# Patient Record
Sex: Female | Born: 1955 | Race: White | Hispanic: No | Smoking: Former smoker
Health system: Southern US, Community
[De-identification: ages and names within clinical notes are randomized; demographics above are authoritative.]

## PROBLEM LIST (undated history)

## (undated) ENCOUNTER — Ambulatory Visit: Admission: EM | Payer: Medicare HMO

## (undated) DIAGNOSIS — G8929 Other chronic pain: Secondary | ICD-10-CM

## (undated) DIAGNOSIS — F32A Depression, unspecified: Secondary | ICD-10-CM

## (undated) DIAGNOSIS — E039 Hypothyroidism, unspecified: Secondary | ICD-10-CM

## (undated) DIAGNOSIS — E785 Hyperlipidemia, unspecified: Secondary | ICD-10-CM

## (undated) DIAGNOSIS — K219 Gastro-esophageal reflux disease without esophagitis: Secondary | ICD-10-CM

## (undated) DIAGNOSIS — R519 Headache, unspecified: Secondary | ICD-10-CM

## (undated) DIAGNOSIS — R112 Nausea with vomiting, unspecified: Secondary | ICD-10-CM

## (undated) DIAGNOSIS — M797 Fibromyalgia: Secondary | ICD-10-CM

## (undated) DIAGNOSIS — E119 Type 2 diabetes mellitus without complications: Secondary | ICD-10-CM

## (undated) DIAGNOSIS — M1990 Unspecified osteoarthritis, unspecified site: Secondary | ICD-10-CM

## (undated) DIAGNOSIS — I10 Essential (primary) hypertension: Secondary | ICD-10-CM

## (undated) DIAGNOSIS — Z9889 Other specified postprocedural states: Secondary | ICD-10-CM

## (undated) DIAGNOSIS — J45909 Unspecified asthma, uncomplicated: Secondary | ICD-10-CM

## (undated) DIAGNOSIS — F419 Anxiety disorder, unspecified: Secondary | ICD-10-CM

## (undated) HISTORY — PX: CHOLECYSTECTOMY: SHX55

## (undated) HISTORY — PX: HERNIA REPAIR: SHX51

## (undated) HISTORY — PX: APPENDECTOMY: SHX54

## (undated) HISTORY — PX: OTHER SURGICAL HISTORY: SHX169

## (undated) HISTORY — PX: KNEE DISLOCATION SURGERY: SHX689

## (undated) HISTORY — PX: CARPAL TUNNEL RELEASE: SHX101

## (undated) HISTORY — PX: ABDOMINAL HYSTERECTOMY: SHX81

## (undated) HISTORY — PX: ANKLE RECONSTRUCTION: SHX1151

## (undated) HISTORY — PX: KNEE ARTHROSCOPY W/ MENISCECTOMY: SHX1879

## (undated) HISTORY — PX: CATARACT EXTRACTION: SUR2

---

## 1999-05-23 ENCOUNTER — Inpatient Hospital Stay (HOSPITAL_COMMUNITY): Admission: EM | Admit: 1999-05-23 | Discharge: 1999-05-27 | Payer: Self-pay | Admitting: *Deleted

## 1999-05-25 ENCOUNTER — Encounter: Payer: Self-pay | Admitting: Cardiology

## 2000-10-17 ENCOUNTER — Other Ambulatory Visit: Admission: RE | Admit: 2000-10-17 | Discharge: 2000-10-17 | Payer: Self-pay | Admitting: Obstetrics and Gynecology

## 2001-05-03 ENCOUNTER — Encounter (HOSPITAL_COMMUNITY): Admission: RE | Admit: 2001-05-03 | Discharge: 2001-06-02 | Payer: Self-pay | Admitting: Neurological Surgery

## 2002-12-17 ENCOUNTER — Encounter: Admission: RE | Admit: 2002-12-17 | Discharge: 2002-12-17 | Payer: Self-pay | Admitting: Internal Medicine

## 2003-06-18 ENCOUNTER — Observation Stay (HOSPITAL_COMMUNITY): Admission: AD | Admit: 2003-06-18 | Discharge: 2003-06-19 | Payer: Self-pay | Admitting: *Deleted

## 2004-05-12 ENCOUNTER — Ambulatory Visit: Payer: Self-pay | Admitting: Cardiology

## 2004-07-15 ENCOUNTER — Encounter (HOSPITAL_COMMUNITY): Admission: RE | Admit: 2004-07-15 | Discharge: 2004-08-14 | Payer: Self-pay | Admitting: Pulmonary Disease

## 2004-07-22 ENCOUNTER — Observation Stay (HOSPITAL_COMMUNITY): Admission: EM | Admit: 2004-07-22 | Discharge: 2004-07-23 | Payer: Self-pay | Admitting: Cardiology

## 2004-07-22 ENCOUNTER — Ambulatory Visit: Payer: Self-pay | Admitting: Cardiology

## 2007-02-23 ENCOUNTER — Ambulatory Visit (HOSPITAL_COMMUNITY): Admission: RE | Admit: 2007-02-23 | Discharge: 2007-02-23 | Payer: Self-pay | Admitting: Obstetrics and Gynecology

## 2010-10-29 NOTE — Cardiovascular Report (Signed)
[place]  Patient:    [name]                        MRN: [id number] Proc. Date: 05/24/99 Adm. Date:  [id number] Attending:  Lewayne Bunting CC:         Oleh Genin, M.D., [facility], [place]                        Cardiac Catheterization  PROCEDURES PERFORMED: 1. Left heart catheterization. 2. Left ventriculogram. 3. Right heart catheterization.  DIAGNOSES: 1. Trivial coronary artery disease by angiogram. 2. Normal left ventricular systolic function. 3. Mild pulmonary hypertension.  HISTORY:  Ms. [name] is a 55 year old white female with a history of hypothyroidism, diabetes mellitus, asthmatic bronchitis, gastroesophageal reflux disease, who presents with increasing chest discomfort and shortness of breath.  The patient was admitted to the hospital and subsequently ruled out for acute myocardial infarction; however, she has continued to have chest discomfort. She presents now to the lab for further cardiac assessment.  TECHNIQUE:  After informed consent was obtained, the patient was brought to the  catheterization lab where both groins were sterilely prepped and draped. Lidocaine, 1%, was used to infiltrate the right groin, and a 6-French arterial nd 8-French venous sheath placed using the modified Seldinger technique.  A 7.5-French PA catheter was advanced via the right femoral vein to the pulmonary artery, and appropriate right-sided hemodynamics were obtained.  A 6-French pigtail catheter was advanced to the left ventricle, and appropriate left-sided hemodynamics were obtained.  A left ventriculogram was then performed using power injections of contrast.  The 6-French JL4 and JR4 catheters were then used to engage the left and right coronary arteries, and selective angiography was performed in various projections using manual injections of contrast.  At the termination of the case, the catheters and sheaths were removed, and manual  pressure applied until adequate hemostasis was achieved.  The patient tolerated the procedure well and was transferred to the floor in stable condition.  FINDINGS:  Findings are as follows: 1. Right heart catheterization    a. R equals 16/11; mean equals 11; RV equals 36/13; PA equals 33/17; pulmonary       capillary wedge pressure equals 16/12; P equals 16.    b. Mitral valve gradient is 4.5 mmHg.    c. Cardiac output equals 7.3 liters per minute with a cardiac index of 3.2       liters per minute/sq m by thermodilution.    d. LV pressure is 123/10.  Aortic was 123/82.  LVEDP equals 23. 2. Left heart catheterization    a. Left main trunk normal.    b. LAD:  Large caliber vessel that provides a first diagonal branch in the       proximal segment.  The LAD and diagonal branch are angiographically normal.    c. Left circumflex artery:  This consists of a large bifurcating first marginal       branch in the proximal segment.  The left circumflex artery has trivial       irregularities.    d. The right coronary artery is dominant.  This is a large caliber vessel that       provides a posterior descending artery as well as several posterior       ventricular branches in its terminal segment.  The right coronary artery as       trivial irregularities.  3. Left ventricle:  Normal end systolic and end diastolic dimensions.  Overall     left ventricular function is well preserved.  Ejection fraction is greater han     55%.  No mitral regurgitation.  ASSESSMENT AND PLAN:  Ms. [name] is a 55 year old female with shortness of breath and chest discomfort of unclear etiology.  At this point, continued medical management of the patients medical problems is warranted.  Attention may be directed toward treatment of diastolic dysfunction, and other causes of her pain will be investigated. DD:  05/24/99 TD:  05/24/99 Job: [id number]

## 2010-10-29 NOTE — Cardiovascular Report (Signed)
[name], SAULTER NO.:  [id number]   MEDICAL RECORD NO.:  [id number]          PATIENT TYPE:  INP   LOCATION:  2930                         FACILITY:  [facility]   PHYSICIAN:  Charlies Constable, M.D. [facility] DATE OF BIRTH:  07/09/1955   DATE OF PROCEDURE:  07/23/2004  DATE OF DISCHARGE:                              CARDIAC CATHETERIZATION   CLINICAL HISTORY:  Ms. Beaupre is 55 years old and was hospitalized at  [facility] recently, and while there she had respiratory arrest.  A  code was called,  but she was never intubated.  Dr. Lewayne Bunting saw her in  consultation and suspected a possible pulmonary embolus, but her VQ scan was  low probability and she subsequently had a CT scan done here which was  negative.  He scheduled her for evaluation angiography today.  She has had a  previous cath in January 2005, a year ago, which showed no to minimal  disease.   PROCEDURE:  The procedure was performed via the right femoral artery using  arterial sheath and 6 French preformed coronary catheters. A front wall  arterial puncture was performed and Omnipaque contrast was used.  We did a  right femoral angiogram to see if closure would e suitable, but stick was at  the bifurcation so we did not close the vessel.  The patient tolerated the  procedure well and left the laboratory in satisfactory condition.   RESULTS:  Aortic pressure was 129/95 with mean of 110.  Left ventricular  pressure 129/26.   Left main coronary artery:  The left main coronary was free of significant  disease.   Left anterior descending artery:  The left anterior descending artery gave  rise to three septal perforators and two diagonal branches.  These and the  LAD proper with free of significant disease.   Circumflex artery:  The circumflex artery gave rise to an atrial branch,  marginal branch, and a posterior lateral branch.  These vessels were free of  significant disease.   Right coronary artery:   The right coronary artery was a moderately large  vessel that gave rise to two right ventricular branches, posterior  descending branch, and three posterior lateral branches.  These vessels were  free of significant disease.   LEFT VENTRICULOGRAM:  The left ventriculogram performed in the RAO  projection showed good wall motion with no areas of hypokinesis.  The  estimated ejection fraction was 60%.   CONCLUSIONS:  Normal coronary angiography and normal left ventricular wall  motion.   RECOMMENDATIONS:  Reassurance.  I spoke with Dr. Andee Lineman and he felt the  patient's respiratory arrest was probably related to narcotics.  Will plan  to let the patient go home today and follow up with Dr. Andee Lineman in [place].      BB/MEDQ  D:  07/23/2004  T:  07/23/2004  Job:  [id number]   cc:   Learta Codding, M.D. [facility]

## 2010-10-29 NOTE — Discharge Summary (Signed)
[name], [name]                ACCOUNT NO.:  [id number]   MEDICAL RECORD NO.:  [id number]          PATIENT TYPE:  INP   LOCATION:  2930                         FACILITY:  [facility]   PHYSICIAN:  [name], M.D. LHCDATE OF BIRTH:  03-Jun-1956   DATE OF ADMISSION:  07/22/2004  DATE OF DISCHARGE:  07/23/2004                           DISCHARGE SUMMARY - REFERRING   SUMMARY OF HISTORY:  [name] is a 55 year old white female who initially  presented to [facility] on July 18, 2004, complaining of abdominal  discomfort that was compatible with diverticulitis.  She also complained of  reflux, nausea and vomiting.  She was receiving Dilaudid for her abdominal  discomfort at [facility], however, her family members stated that she  turned blue and stopped breathing when her oxygen saturation had dropped  down to 42%.  She was also noted to be hypotensive and resuscitated with  Narcan.  She did not need CPR nor intubation.  [facility] was asked  to evaluate because she had an abnormal EKG and some positive troponins were  initially felt to be possibly related to a pulmonary embolism.  However, a  VQ scan at [facility] showed low probability despite an elevated D-  dimer.  She was transferred to [facility] for cardiac  catheterization.   PAST MEDICAL HISTORY:  1.  Hyperlipidemia.  2.  Obesity.  3.  Adult onset diabetes.  4.  Depression.  5.  Asthmatic bronchitis.  6.  Chronic migraine headaches.  7.  DJD with nonobstructive coronary artery disease and catheterization in      2000.  8.  History of GERD.  9.  Cervical disc disease.  10. Hypothyroidism.  11. Wolff-Parkinson-White syndrome.  12. Status post ablation in 1993 at [facility].  13. Hyperlipidemia.  14. Depression.   LABORATORY DATA:  At [facility], the admission H&H was 14.4 and 43.9,  normal indices, platelets 199, wbc 10.0.  Subsequent  hematologies were  unremarkable.  PT 27.6.  D-dimer on July 22, 2004, was elevated at 1053.  Urinalysis on July 19, 2004, was unremarkable.  IgG on July 21, 2004,  was 615, which was low.  Relative indices at [facility] were  unremarkable.  Troponins, however, were slightly abnormal at 0.20, 011 and  0.05.  TSH was 5.42.  Sodium 136, potassium 3.8.  BUN 8, creatinine 0.6,  glucose 185.  Normal LFTs.   CT scan at [facility] did not show any evidence of  pulmonary embolus.  CT of the abdomen at [facility] on July 19, 2004, showed findings consistent with diverticulitis involving the sigmoid  colon, no evidence of abscess or incarceration.  Status post  cholecystectomy, diffuse fatty infiltration of the liver.  Chest x-ray  showed clear lungs on July 22, 2004.  VQ scan showed low probability for  pulmonary embolus.   EKGs at [facility] showed normal sinus rhythm, normal  axis, nonspecific STT wave changes.   HOSPITAL COURSE:  [name] was admitted to the coronary care unit  and  placed on IV heparin and nitroglycerin.  Overnight she stated that her  abdominal discomfort had significantly decreased since her admission,  however, she is still complaining of chest discomfort.  Repeat CK-MB at  [facility] was unremarkable and troponin I was 0.05.  Repeat hematology and chemistry was unremarkable.  She was arranged fro  cardiac catheterization.  Cardiac catheterization was performed on July 23, 2004, by Dr. Juanda Chance and showed an EF of 60% and no coronary artery  disease.  After reviewing with Dr. Andee Lineman, it was felt that the patient  could be discharged home with outpatient follow-up with her primary care  physician.   DISCHARGE DIAGNOSES:  1.  Diverticulitis.  2.  Gastroesophageal reflux disease.  3.  Respiratory arrest secondary to narcotics.  4.  Hyperglycemia with a history of diabetes and  elevated hemoglobin A1C per      the patient.  5.  Obesity.  6.  Normocytic anemia.  7.  History as previously described.   DISPOSITION:  She is asked to continue her home medications which include:  1.  Prevacid 30 mg b.i.d.  2.  Synthroid 0.125 mg daily.  3.  Resume her Glucovance half tablet daily on Sunday, July 25, 2004.  4.  Prozac 20 mg daily.  5.  Zocor 80 mg q.h.s.  6.  Ogen 1.25 mg daily.  7.  Reglan as previously.  8.  Xopenex four times a day as needed.  9.  Singulair 10 mg daily.  10. Xanax and Vicodin as previously.  11. Claritin 10 mg daily.  12. Topamax 200 mg daily.   She is advised to maintain a low salt, fat, cholesterol, ADA diet.  No  lifting, driving, sexual activity or heavy exertion for two days.  She was  asked to observe her catheterization site for bruising, draining, swelling  or discomfort and to call the [place], [place], office for problems.  She was asked to arrange a one to two week follow-up appointment with Dr.  Sherril Croon.      EW/MEDQ  D:  07/23/2004  T:  07/23/2004  Job:  [id number]   cc:   [name]  [address]  [place]  [place] [address]  Fax: [phone]   [name], M.D. [facility]

## 2010-10-29 NOTE — Discharge Summary (Signed)
[name], [name]                          ACCOUNT NO.:  [id number]   MEDICAL RECORD NO.:  [id number]                   PATIENT TYPE:  INP   LOCATION:  6529                                 FACILITY:   PHYSICIAN:  [name], M.D.                  DATE OF BIRTH:  1956/03/19   DATE OF ADMISSION:  06/18/2003  DATE OF DISCHARGE:  06/19/2003                           DISCHARGE SUMMARY - REFERRING   PROCEDURE:  1. Coronary angiogram, January 5.  2. Chest CT scan.   REASON FOR ADMISSION:  Please refer to Dr. Celedonio Tapia dictated  consultation note.   LABORATORY DATA:  D-dimer 0.49, WBC 5.9, HGB 11.9, HCT 36, platelet 172 at  discharge.  Sodium 139, potassium 3.7, glucose 154, BUN 13, creatinine 0.7  at discharge.  TSH 17.8 at discharge.   HOSPITAL COURSE:  Following initial presentation to [facility] with  complaint of chest pain, the patient was referred to [facility] for  consultation.  The patient has complex past medical history including  history of nonobstructive coronary artery disease by previous coronary  angiograms and Wolff-Parkinson-White syndrome status post RF ablation in  1993 [facility]).  Of note, the patient ruled out for myocardial infarction while  at [facility].  However, given her recurrent pain, recommendation was  to transfer for a definitive exclusion of significant coronary artery  disease by diagnostic coronary angiogram.   Following transfer, the patient underwent coronary angiogram by Dr.  ___________, (see report for full details), revealing normal coronary  arteries with the exception of a 20% proximal RCA lesion.  LV gram was  normal (EF 55-60%).   Following the procedure, the patient was referred for a chest CT scan.  This  revealed no evidence of pulmonary embolus and was also negative for aortic  dissection.   No further cardiac workup was recommended.   Of note, the patient does have elevated TSH, she stated that this  was  recently increased by her primary care physician to the current Synthroid  dose.  The patient also reports having a history of gastroesophageal reflux  disease and reporting relief with Prevacid.  She was thus placed back on  this at b.i.d. dosing.  She was otherwise to resume previous home  medications.   DISCHARGE MEDICATIONS:  1. Coated aspirin 81 mg daily.  2. Zocor 80 mg q.h.s.  3. Prevacid 30 mg b.i.d.  4. Synthroid 0.112 mg daily.  5. Ogen 1.25 mg daily.  6. Claritin 10 mg daily.  7. Zovirax 200 mg b.i.d.  8. Advair 250/50 mg daily.  9. Singulair 10 mg daily.  10.      Prozac 20 mg daily.  11.      Xanax p.r.n.  12.      Amaryl 1 mg daily.   INSTRUCTIONS:  No heavy lifting/driving x2 days.  Maintain low fat,  cholesterol diet.  Call the office if there  is any swelling/bleed in the  groin.   The patient is instructed to arrange followup with her primary care  physician, Dr. Loreen Tapia, in the following week.   DISCHARGE DIAGNOSES:  1. Noncardiac chest pain.     A. Negative serial cardiac markers.     B. Nonobstructive coronary artery disease/normal left ventricular        function coronary angiogram, January 5.  2. Wolff-Parkinson-White syndrome.  3. Status post RF ablation 1993 [facility]).  4. Treated hypothyroidism.     A. Status post recent medication up titration.  5. Type 2 diabetes mellitus.  6. Asthma.  7. Hypercholesterolemia.  8. Morbid obesity.  9. Gastroesophageal reflux disease.      [name], P.A. [facility]                      [name], M.D.    GS/MEDQ  D:  06/19/2003  T:  06/19/2003  Job:  [id number]   cc:   [name]  [address]  [place]  [place] [address]  Fax: [phone]

## 2010-10-29 NOTE — Cardiovascular Report (Signed)
NAME:  [name], [name]                          ACCOUNT NO.:  [id number]   MEDICAL RECORD NO.:  [id number]                   PATIENT TYPE:  INP   LOCATION:  NA                                   FACILITY:  [facility]   PHYSICIAN:  Jonelle Sidle, M.D. [facility]        DATE OF BIRTH:  12/24/1955   DATE OF PROCEDURE:  06/18/2003  DATE OF DISCHARGE:                              CARDIAC CATHETERIZATION   PRIMARY CARE PHYSICIAN:  __________   Corinda Gubler CARDIOLOGIST:  Dr. Myrtis Ser   INDICATIONS:  [name] is a 55 year old woman with obesity, type 2 diabetes  mellitus, gastroesophageal reflux disease, dyslipidemia, asthmatic  bronchitis, hypothyroidism, and a history of Wolff-Parkinson-White syndrome  status post ablation in 1993.  She presents with recurrent prolonged  episodes of chest pain and has ruled out for myocardial infarction.  This  study is requested to evaluate the coronary anatomy to assess for potential  progression.  Last cardiac catheterization in 2000 showed a trivial  atherosclerosis.   PROCEDURES PERFORMED:  1. Left heart catheterization.  2. Selective coronary angiography.  3. Left ventriculography.   ACCESS AND EQUIPMENT:  The area about the right femoral artery was  anesthetized with 1% lidocaine and a 6-French sheath was placed in the right  femoral artery via the modified Seldinger technique.  Standard preformed 6-  [place] and JR4 catheters were used for selective coronary angiography  and an angled pigtail catheter was used for left heart catheterization, left  ventriculography.  All exchanges were made over a wire and the patient  tolerated the procedure well without immediate complications.   HEMODYNAMICS:  1. Left ventricle 125/25 mmHg.  2. Aorta 124/79 mmHg.   ANGIOGRAPHIC FINDINGS:  1. The left main coronary artery is free of significant flow limiting     coronary atherosclerosis.  2. The left anterior descending is a medium to large caliber vessel without    significant flow limiting coronary atherosclerosis.  There is a diagonal     branch that is also without significant disease.  3. The circumflex coronary artery is a medium caliber vessel which largely     consists of a large bifurcating obtuse marginal.  There is no significant     flow limiting coronary atherosclerosis within this system.  4. The right coronary artery is a medium to large caliber vessel that is     dominant.  There is approximately 20% stenosis in the proximal segment,     although some of this could be catheter induced.  No flow limiting     stenoses are noted.   LEFT VENTRICULOGRAPHY:  Left ventricular ejection fraction is estimated at  55-60% based on left ventriculography performed in the RAO projection.  No  significant mitral regurgitation is noted.   DIAGNOSES:  1. Minor coronary atherosclerosis as outlined.  No significant flow limiting     stenoses are identified.  2. Left ventricular ejection fraction of 55-60%.  RECOMMENDATIONS:  I discussed the film with Dr. Myrtis Ser.  At this point from a  cardiac perspective would continue with risk factor modification and search  for other potential etiologies of chest pain.                                               Jonelle Sidle, M.D. [facility]    SGM/MEDQ  D:  06/18/2003  T:  06/18/2003  Job:  [id number]

## 2013-01-16 ENCOUNTER — Ambulatory Visit (HOSPITAL_COMMUNITY): Payer: Self-pay | Admitting: Psychology

## 2020-09-24 ENCOUNTER — Other Ambulatory Visit: Payer: Self-pay | Admitting: "Endocrinology

## 2020-09-24 ENCOUNTER — Other Ambulatory Visit: Payer: Self-pay | Admitting: Nurse Practitioner

## 2020-09-24 DIAGNOSIS — W19XXXA Unspecified fall, initial encounter: Secondary | ICD-10-CM

## 2020-10-12 ENCOUNTER — Ambulatory Visit
Admission: RE | Admit: 2020-10-12 | Discharge: 2020-10-12 | Disposition: A | Discharge status: Home / Self Care | Payer: Medicare HMO | Source: Ambulatory Visit | Attending: Nurse Practitioner | Admitting: Nurse Practitioner

## 2020-10-12 ENCOUNTER — Other Ambulatory Visit: Payer: Self-pay

## 2020-10-12 DIAGNOSIS — W19XXXA Unspecified fall, initial encounter: Secondary | ICD-10-CM

## 2020-10-12 MED ORDER — GADOBENATE DIMEGLUMINE 529 MG/ML IV SOLN
20.0000 mL | Freq: Once | INTRAVENOUS | Status: AC | PRN
  Administered 2020-10-12: 20 mL via INTRAVENOUS

## 2020-10-30 ENCOUNTER — Other Ambulatory Visit: Payer: Self-pay | Admitting: Physician Assistant

## 2020-10-30 ENCOUNTER — Other Ambulatory Visit: Payer: Self-pay

## 2020-10-30 ENCOUNTER — Ambulatory Visit
Admission: RE | Admit: 2020-10-30 | Discharge: 2020-10-30 | Disposition: A | Discharge status: Home / Self Care | Payer: Medicare HMO | Source: Ambulatory Visit | Attending: Physician Assistant | Admitting: Physician Assistant

## 2020-10-30 DIAGNOSIS — K5900 Constipation, unspecified: Secondary | ICD-10-CM

## 2020-10-30 DIAGNOSIS — R109 Unspecified abdominal pain: Secondary | ICD-10-CM

## 2020-11-23 ENCOUNTER — Other Ambulatory Visit: Payer: Self-pay | Admitting: Physician Assistant

## 2020-11-23 DIAGNOSIS — R1310 Dysphagia, unspecified: Secondary | ICD-10-CM

## 2020-11-23 DIAGNOSIS — K219 Gastro-esophageal reflux disease without esophagitis: Secondary | ICD-10-CM

## 2020-11-27 ENCOUNTER — Ambulatory Visit
Admission: RE | Admit: 2020-11-27 | Discharge: 2020-11-27 | Disposition: A | Discharge status: Home / Self Care | Payer: Medicare HMO | Source: Ambulatory Visit | Attending: Physician Assistant | Admitting: Physician Assistant

## 2020-11-27 DIAGNOSIS — R1310 Dysphagia, unspecified: Secondary | ICD-10-CM

## 2020-11-27 DIAGNOSIS — K219 Gastro-esophageal reflux disease without esophagitis: Secondary | ICD-10-CM

## 2021-02-03 ENCOUNTER — Other Ambulatory Visit: Payer: Self-pay

## 2021-02-03 ENCOUNTER — Ambulatory Visit: Payer: Medicare HMO | Admitting: Podiatry

## 2021-02-03 DIAGNOSIS — L03031 Cellulitis of right toe: Secondary | ICD-10-CM | POA: Diagnosis not present

## 2021-02-03 DIAGNOSIS — L603 Nail dystrophy: Secondary | ICD-10-CM

## 2021-02-03 MED ORDER — DOXYCYCLINE HYCLATE 100 MG PO TABS: 100.0000 mg | ORAL_TABLET | Freq: Two times a day (BID) | ORAL | 0 refills | Status: AC

## 2021-02-04 ENCOUNTER — Encounter: Payer: Self-pay | Admitting: Podiatry

## 2021-02-04 NOTE — Progress Notes (Signed)
Subjective:  Patient ID: TUWANA KAPAUN, female    DOB: January 01, 1956,  MRN: [id number]  Chief Complaint  Patient presents with   Ingrown Toenail    Right foot 2nd toe     65 y.o. female presents with the above complaint.  Patient presents with right second digit toenail paronychia.  Patient states that she may have stubbed it and there might be an ingrown.  There is some redness no drainage.  She has not seen and was prior to seeing me.  She would like to have the nail removed if needed.  She is a diabetic with last A1c of that is unknown.  She is on metformin.  She denies any other acute, she has not seen and was prior to seeing me.  Her pain scale is 5 out of 10 is aching nature.  She has not taken any antibiotics   Review of Systems: Negative except as noted in the HPI. Denies N/V/F/Ch.  No past medical history on file.  Current Outpatient Medications:    doxycycline (VIBRA-TABS) 100 MG tablet, Take 1 tablet (100 mg total) by mouth 2 (two) times daily for 10 days., Disp: 20 tablet, Rfl: 0   busPIRone (BUSPAR) 10 MG tablet, Take by mouth., Disp: , Rfl:    chlorthalidone (HYGROTON) 25 MG tablet, Take by mouth., Disp: , Rfl:    Cholecalciferol 25 MCG (1000 UT) tablet, Take by mouth., Disp: , Rfl:    dicyclomine (BENTYL) 10 MG capsule, Take by mouth., Disp: , Rfl:    fexofenadine (ALLEGRA) 180 MG tablet, Take by mouth., Disp: , Rfl:    FLUoxetine (PROZAC) 40 MG capsule, Take by mouth., Disp: , Rfl:    gabapentin (NEURONTIN) 600 MG tablet, Take by mouth., Disp: , Rfl:    HYDROcodone-acetaminophen (NORCO/VICODIN) 5-325 MG tablet, Take 1 tablet by mouth every 6 (six) hours as needed., Disp: , Rfl:    levothyroxine (SYNTHROID) 200 MCG tablet, Take by mouth., Disp: , Rfl:    metFORMIN (GLUCOPHAGE) 500 MG tablet, Take by mouth., Disp: , Rfl:    montelukast (SINGULAIR) 10 MG tablet, Take by mouth., Disp: , Rfl:    ondansetron (ZOFRAN) 4 MG tablet, Take by mouth., Disp: , Rfl:    pantoprazole  (PROTONIX) 40 MG tablet, Take by mouth., Disp: , Rfl:   Social History   Tobacco Use  Smoking Status Not on file  Smokeless Tobacco Not on file    Allergies  Allergen Reactions   Hydromorphone Shortness Of Breath    Respiratory arrest per patient   Penicillins Anaphylaxis   Erythromycin Base Nausea And Vomiting   Meperidine Hives   Objective:  There were no vitals filed for this visit. There is no height or weight on file to calculate BMI. Constitutional Well developed. Well nourished.  Vascular Dorsalis pedis pulses palpable bilaterally. Posterior tibial pulses palpable bilaterally. Capillary refill normal to all digits.  No cyanosis or clubbing noted. Pedal hair growth normal.  Neurologic Normal speech. Oriented to person, place, and time. Epicritic sensation to light touch grossly present bilaterally.  Dermatologic Pain on palpation of the entire/total nail on 2nd digit of the right No other open wounds. No skin lesions.  Orthopedic: Normal joint ROM without pain or crepitus bilaterally. No visible deformities. No bony tenderness.   Radiographs: None Assessment:   1. Paronychia of second toe, right   2. Nail dystrophy    Plan:  Patient was evaluated and treated and all questions answered.  Nail contusion/dystrophy second, right with  underlying paronychia -Patient elects to proceed with minor surgery to remove entire toenail today. Consent reviewed and signed by patient. -Entire/total nail excised. See procedure note. -Educated on post-procedure care including soaking. Written instructions provided and reviewed. -Patient to follow up in 2 weeks for nail check. -Doxycycline was dispensed for skin and soft tissue prophylaxis.  I did discuss with her given that she is diabetic she is at high risk of losing the toe given the nature of the infection.  I discussed with her that if the infection gets worse to go to the emergency room right away.  She states  understanding.  Procedure: Excision of entire/total nail  Location: Right 2nd toe digit Anesthesia: Lidocaine 1% plain; 1.5 mL and Marcaine 0.5% plain; 1.5 mL, digital block. Skin Prep: Betadine. Dressing: Silvadene; telfa; dry, sterile, compression dressing. Technique: Following skin prep, the toe was exsanguinated and a tourniquet was secured at the base of the toe. The affected nail border was freed and excised. The tourniquet was then removed and sterile dressing applied. Disposition: Patient tolerated procedure well. Patient to return in 2 weeks for follow-up.   No follow-ups on file.

## 2021-02-17 NOTE — Progress Notes (Signed)
Sent message, via epic in basket, requesting orders in epic from surgeon.  

## 2021-02-25 NOTE — Patient Instructions (Addendum)
DUE TO COVID-19 ONLY ONE VISITOR IS ALLOWED TO COME WITH YOU AND STAY IN THE WAITING ROOM ONLY DURING PRE OP AND PROCEDURE.   **NO VISITORS ARE ALLOWED IN THE SHORT STAY AREA OR RECOVERY ROOM!!**  IF YOU WILL BE ADMITTED INTO THE HOSPITAL YOU ARE ALLOWED ONLY TWO SUPPORT PEOPLE DURING VISITATION HOURS ONLY (10AM -8PM)   The support person(s) may change daily. The support person(s) must pass our screening, gel in and out, and wear a mask at all times, including in the patient's room. Patients must also wear a mask when staff or their support person are in the room.  No visitors under the age of 56. Any visitor under the age of 41 must be accompanied by an adult.    COVID SWAB TESTING MUST BE COMPLETED ON:  03/08/21 **MUST PRESENT COMPLETED FORM AT TESTING SITE**    [address] [place] (backside of the building) Open 8am-3pm. No appointment needed You are not required to quarantine, however you are required to wear a well-fitted mask when you are out and around people not in your household.  Hand Hygiene often Do NOT share personal items Notify your provider if you are in close contact with someone who has COVID or you develop fever 100.4 or greater, new onset of sneezing, cough, sore throat, shortness of breath or body aches.       Your procedure is scheduled on: 03/10/21   Report to [facility] Main  Entrance   Report to Short Stay at 5:15 AM   [facility])   Call this number if you have problems the morning of surgery [phone]   Do not eat food :After Midnight.   May have liquids until 4:30 AM day of surgery  CLEAR LIQUID DIET  Foods Allowed                                                                     Foods Excluded  Water, Black Coffee and tea (no milk or creamer)           liquids that you cannot  Plain Jell-O in any flavor  (No red)                                    see through such as: Fruit ices (not with fruit pulp)                                             milk, soups, orange juice              Iced Popsicles (No red)                                                All solid food  Apple juices Sports drinks like Gatorade (No red) Lightly seasoned clear broth or consume(fat free) Sugar      The day of surgery:  Drink ONE (1) Pre-Surgery G2 by 4:30 am the morning of surgery. Drink in one sitting. Do not sip.  This drink was given to you during your hospital  pre-op appointment visit. Nothing else to drink after completing the  Pre-Surgery G2.          If you have questions, please contact your surgeon's office.     Oral Hygiene is also important to reduce your risk of infection.                                    Remember - BRUSH YOUR TEETH THE MORNING OF SURGERY WITH YOUR REGULAR TOOTHPASTE   Take these medicines the morning of surgery with A SIP OF WATER: Buspar, Prozac, Gabapentin, Levothyroxine, Singulair, Protonix  DO NOT TAKE ANY ORAL DIABETIC MEDICATIONS DAY OF YOUR SURGERY  How to Manage Your Diabetes Before and After Surgery  Why is it important to control my blood sugar before and after surgery? Improving blood sugar levels before and after surgery helps healing and can limit problems. A way of improving blood sugar control is eating a healthy diet by:  Eating less sugar and carbohydrates  Increasing activity/exercise  Talking with your doctor about reaching your blood sugar goals High blood sugars (greater than 180 mg/dL) can raise your risk of infections and slow your recovery, so you will need to focus on controlling your diabetes during the weeks before surgery. Make sure that the doctor who takes care of your diabetes knows about your planned surgery including the date and location.  How do I manage my blood sugar before surgery? Check your blood sugar at least 4 times a day, starting 2 days before surgery, to make sure that the level is not too high or  low. Check your blood sugar the morning of your surgery when you wake up and every 2 hours until you get to the Short Stay unit. If your blood sugar is less than 70 mg/dL, you will need to treat for low blood sugar: Do not take insulin. Treat a low blood sugar (less than 70 mg/dL) with  cup of clear juice (cranberry or apple), 4 glucose tablets, OR glucose gel. Recheck blood sugar in 15 minutes after treatment (to make sure it is greater than 70 mg/dL). If your blood sugar is not greater than 70 mg/dL on recheck, call [phone] for further instructions. Report your blood sugar to the short stay nurse when you get to Short Stay.  If you are admitted to the hospital after surgery: Your blood sugar will be checked by the staff and you will probably be given insulin after surgery (instead of oral diabetes medicines) to make sure you have good blood sugar levels. The goal for blood sugar control after surgery is 80-180 mg/dL.   WHAT DO I DO ABOUT MY DIABETES MEDICATION?  Do not take oral diabetes medicines (pills) the morning of surgery.  THE DAY BEFORE SURGERY, take Metformin as prescribed. Take 13 units of Lantus. Take Novolog before meals as usual, no bedtime dose.      THE MORNING OF SURGERY, do not take Metformin. Take 50% of sliding scale dose if blood sugar is greater than 220.   Reviewed and Endorsed by [facility] Patient Education Committee, August  2015                               You may not have any metal on your body including hair pins, jewelry, and body piercing             Do not wear make-up, lotions, powders, perfumes, or deodorant  Do not wear nail polish including gel and S&S, artificial/acrylic nails, or any other type of covering on natural nails including finger and toenails. If you have artificial nails, gel coating, etc. that needs to be removed by a nail salon please have this removed prior to surgery or surgery may need to be canceled/ delayed if the surgeon/  anesthesia feels like they are unable to be safely monitored.   Do not shave  48 hours prior to surgery.    Do not bring valuables to the hospital. [place] IS NOT             RESPONSIBLE   FOR VALUABLES.   Contacts, dentures or bridgework may not be worn into surgery.   Bring small overnight bag day of surgery.   Special Instructions: Bring a copy of your healthcare power of attorney and living will documents         the day of surgery if you haven't scanned them in before.   Please read over the following fact sheets you were given: IF YOU HAVE QUESTIONS ABOUT YOUR PRE OP INSTRUCTIONS PLEASE CALL [phone]- [facility] - Preparing for Surgery Before surgery, you can play an important role.  Because skin is not sterile, your skin needs to be as free of germs as possible.  You can reduce the number of germs on your skin by washing with CHG (chlorahexidine gluconate) soap before surgery.  CHG is an antiseptic cleaner which kills germs and bonds with the skin to continue killing germs even after washing. Please DO NOT use if you have an allergy to CHG or antibacterial soaps.  If your skin becomes reddened/irritated stop using the CHG and inform your nurse when you arrive at Short Stay. Do not shave (including legs and underarms) for at least 48 hours prior to the first CHG shower.  You may shave your face/neck.  Please follow these instructions carefully:  1.  Shower with CHG Soap the night before surgery and the  morning of surgery.  2.  If you choose to wash your hair, wash your hair first as usual with your normal  shampoo.  3.  After you shampoo, rinse your hair and body thoroughly to remove the shampoo.                             4.  Use CHG as you would any other liquid soap.  You can apply chg directly to the skin and wash.  Gently with a scrungie or clean washcloth.  5.  Apply the CHG Soap to your body ONLY FROM THE NECK DOWN.   Do   not use on face/ open                            Wound or open sores. Avoid contact with eyes, ears mouth and   genitals (private parts).  Wash face,  Genitals (private parts) with your normal soap.             6.  Wash thoroughly, paying special attention to the area where your    surgery  will be performed.  7.  Thoroughly rinse your body with warm water from the neck down.  8.  DO NOT shower/wash with your normal soap after using and rinsing off the CHG Soap.                9.  Pat yourself dry with a clean towel.            10.  Wear clean pajamas.            11.  Place clean sheets on your bed the night of your first shower and do not  sleep with pets. Day of Surgery : Do not apply any lotions/deodorants the morning of surgery.  Please wear clean clothes to the hospital/surgery center.  FAILURE TO FOLLOW THESE INSTRUCTIONS MAY RESULT IN THE CANCELLATION OF YOUR SURGERY  PATIENT SIGNATURE_________________________________  NURSE SIGNATURE__________________________________  ________________________________________________________________________   [name]  An incentive spirometer is a tool that can help keep your lungs clear and active. This tool measures how well you are filling your lungs with each breath. Taking long deep breaths may help reverse or decrease the chance of developing breathing (pulmonary) problems (especially infection) following: A long period of time when you are unable to move or be active. BEFORE THE PROCEDURE  If the spirometer includes an indicator to show your best effort, your nurse or respiratory therapist will set it to a desired goal. If possible, sit up straight or lean slightly forward. Try not to slouch. Hold the incentive spirometer in an upright position. INSTRUCTIONS FOR USE  Sit on the edge of your bed if possible, or sit up as far as you can in bed or on a chair. Hold the incentive spirometer in an upright position. Breathe out normally. Place the  mouthpiece in your mouth and seal your lips tightly around it. Breathe in slowly and as deeply as possible, raising the piston or the ball toward the top of the column. Hold your breath for 3-5 seconds or for as long as possible. Allow the piston or ball to fall to the bottom of the column. Remove the mouthpiece from your mouth and breathe out normally. Rest for a few seconds and repeat Steps 1 through 7 at least 10 times every 1-2 hours when you are awake. Take your time and take a few normal breaths between deep breaths. The spirometer may include an indicator to show your best effort. Use the indicator as a goal to work toward during each repetition. After each set of 10 deep breaths, practice coughing to be sure your lungs are clear. If you have an incision (the cut made at the time of surgery), support your incision when coughing by placing a pillow or rolled up towels firmly against it. Once you are able to get out of bed, walk around indoors and cough well. You may stop using the incentive spirometer when instructed by your caregiver.  RISKS AND COMPLICATIONS Take your time so you do not get dizzy or light-headed. If you are in pain, you may need to take or ask for pain medication before doing incentive spirometry. It is harder to take a deep breath if you are having pain. AFTER USE Rest and breathe slowly and easily. It can be helpful to  keep track of a log of your progress. Your caregiver can provide you with a simple table to help with this. If you are using the spirometer at home, follow these instructions: SEEK MEDICAL CARE IF:  You are having difficultly using the spirometer. You have trouble using the spirometer as often as instructed. Your pain medication is not giving enough relief while using the spirometer. You develop fever of 100.5 F (38.1 C) or higher. SEEK IMMEDIATE MEDICAL CARE IF:  You cough up bloody sputum that had not been present before. You develop fever of 102 F  (38.9 C) or greater. You develop worsening pain at or near the incision site. MAKE SURE YOU:  Understand these instructions. Will watch your condition. Will get help right away if you are not doing well or get worse. Document Released: 10/10/2006 Document Revised: 08/22/2011 Document Reviewed: 12/11/2006 [employer] Patient Information 2014 [employer], [place].   ________________________________________________________________________  WHAT IS A BLOOD TRANSFUSION? Blood Transfusion Information  A transfusion is the replacement of blood or some of its parts. Blood is made up of multiple cells which provide different functions. Red blood cells carry oxygen and are used for blood loss replacement. White blood cells fight against infection. Platelets control bleeding. Plasma helps clot blood. Other blood products are available for specialized needs, such as hemophilia or other clotting disorders. BEFORE THE TRANSFUSION  Who gives blood for transfusions?  Healthy volunteers who are fully evaluated to make sure their blood is safe. This is blood bank blood. Transfusion therapy is the safest it has ever been in the practice of medicine. Before blood is taken from a donor, a complete history is taken to make sure that person has no history of diseases nor engages in risky social behavior (examples are intravenous drug use or sexual activity with multiple partners). The donor's travel history is screened to minimize risk of transmitting infections, such as malaria. The donated blood is tested for signs of infectious diseases, such as HIV and hepatitis. The blood is then tested to be sure it is compatible with you in order to minimize the chance of a transfusion reaction. If you or a relative donates blood, this is often done in anticipation of surgery and is not appropriate for emergency situations. It takes many days to process the donated blood. RISKS AND COMPLICATIONS Although transfusion therapy is very  safe and saves many lives, the main dangers of transfusion include:  Getting an infectious disease. Developing a transfusion reaction. This is an allergic reaction to something in the blood you were given. Every precaution is taken to prevent this. The decision to have a blood transfusion has been considered carefully by your caregiver before blood is given. Blood is not given unless the benefits outweigh the risks. AFTER THE TRANSFUSION Right after receiving a blood transfusion, you will usually feel much better and more energetic. This is especially true if your red blood cells have gotten low (anemic). The transfusion raises the level of the red blood cells which carry oxygen, and this usually causes an energy increase. The nurse administering the transfusion will monitor you carefully for complications. HOME CARE INSTRUCTIONS  No special instructions are needed after a transfusion. You may find your energy is better. Speak with your caregiver about any limitations on activity for underlying diseases you may have. SEEK MEDICAL CARE IF:  Your condition is not improving after your transfusion. You develop redness or irritation at the intravenous (IV) site. SEEK IMMEDIATE MEDICAL CARE IF:  Any of the  following symptoms occur over the next 12 hours: Shaking chills. You have a temperature by mouth above 102 F (38.9 C), not controlled by medicine. Chest, back, or muscle pain. People around you feel you are not acting correctly or are confused. Shortness of breath or difficulty breathing. Dizziness and fainting. You get a rash or develop hives. You have a decrease in urine output. Your urine turns a dark color or changes to pink, red, or brown. Any of the following symptoms occur over the next 10 days: You have a temperature by mouth above 102 F (38.9 C), not controlled by medicine. Shortness of breath. Weakness after normal activity. The white part of the eye turns yellow (jaundice). You  have a decrease in the amount of urine or are urinating less often. Your urine turns a dark color or changes to pink, red, or brown. Document Released: 05/27/2000 Document Revised: 08/22/2011 Document Reviewed: 01/14/2008 [facility] Patient Information 2014 [place], [place].  _______________________________________________________________________

## 2021-02-25 NOTE — Progress Notes (Addendum)
COVID swab appointment: 03/08/21  COVID Vaccine Completed: No Date COVID Vaccine completed: Has received booster: COVID vaccine manufacturer: Pfizer    [employer]   Date of COVID positive in last 90 days: No  PCP - Wallace Cullens, FNP Cardiologist - N/a  Chest x-ray - N/a EKG - 02/26/21 Epic Stress Test - [facility], 5 years ago ECHO - [facility] Cardiac Cath -  [facility] Pacemaker/ICD device last checked: N/a Spinal Cord Stimulator: N/a  Sleep Study - yes years ago, positive CPAP - no, lost weight  Fasting Blood Sugar - 70-130 Checks Blood Sugar _3-5_ times a day  Blood Thinner Instructions: ASA 81 Aspirin Instructions: hold 7 days before surgery Last Dose:  Activity level: Can go up a flight of stairs and perform activities of daily living without stopping and without symptoms of chest pain or shortness of breath.    Anesthesia review: new RBBB on EKG  Patient denies shortness of breath, fever, cough and chest pain at PAT appointment   Patient verbalized understanding of instructions that were given to them at the PAT appointment. Patient was also instructed that they will need to review over the PAT instructions again at home before surgery.

## 2021-02-26 ENCOUNTER — Encounter (HOSPITAL_COMMUNITY): Payer: Self-pay

## 2021-02-26 ENCOUNTER — Encounter (HOSPITAL_COMMUNITY)
Admission: RE | Admit: 2021-02-26 | Discharge: 2021-02-26 | Disposition: A | Discharge status: Home / Self Care | Payer: Medicare HMO | Source: Ambulatory Visit | Attending: Orthopedic Surgery | Admitting: Orthopedic Surgery

## 2021-02-26 ENCOUNTER — Other Ambulatory Visit: Payer: Self-pay

## 2021-02-26 DIAGNOSIS — K219 Gastro-esophageal reflux disease without esophagitis: Secondary | ICD-10-CM | POA: Diagnosis not present

## 2021-02-26 DIAGNOSIS — Z7984 Long term (current) use of oral hypoglycemic drugs: Secondary | ICD-10-CM | POA: Diagnosis not present

## 2021-02-26 DIAGNOSIS — Z79899 Other long term (current) drug therapy: Secondary | ICD-10-CM | POA: Diagnosis not present

## 2021-02-26 DIAGNOSIS — I10 Essential (primary) hypertension: Secondary | ICD-10-CM | POA: Diagnosis not present

## 2021-02-26 DIAGNOSIS — Z7982 Long term (current) use of aspirin: Secondary | ICD-10-CM | POA: Insufficient documentation

## 2021-02-26 DIAGNOSIS — J45909 Unspecified asthma, uncomplicated: Secondary | ICD-10-CM | POA: Diagnosis not present

## 2021-02-26 DIAGNOSIS — M1611 Unilateral primary osteoarthritis, right hip: Secondary | ICD-10-CM | POA: Diagnosis not present

## 2021-02-26 DIAGNOSIS — Z01818 Encounter for other preprocedural examination: Secondary | ICD-10-CM | POA: Insufficient documentation

## 2021-02-26 DIAGNOSIS — M797 Fibromyalgia: Secondary | ICD-10-CM | POA: Insufficient documentation

## 2021-02-26 DIAGNOSIS — Z794 Long term (current) use of insulin: Secondary | ICD-10-CM | POA: Insufficient documentation

## 2021-02-26 DIAGNOSIS — E119 Type 2 diabetes mellitus without complications: Secondary | ICD-10-CM | POA: Diagnosis not present

## 2021-02-26 DIAGNOSIS — I4510 Unspecified right bundle-branch block: Secondary | ICD-10-CM | POA: Insufficient documentation

## 2021-02-26 HISTORY — DX: Headache, unspecified: R51.9

## 2021-02-26 HISTORY — DX: Nausea with vomiting, unspecified: R11.2

## 2021-02-26 HISTORY — DX: Unspecified asthma, uncomplicated: J45.909

## 2021-02-26 HISTORY — DX: Type 2 diabetes mellitus without complications: E11.9

## 2021-02-26 HISTORY — DX: Other specified postprocedural states: Z98.890

## 2021-02-26 HISTORY — DX: Gastro-esophageal reflux disease without esophagitis: K21.9

## 2021-02-26 HISTORY — DX: Anxiety disorder, unspecified: F41.9

## 2021-02-26 HISTORY — DX: Depression, unspecified: F32.A

## 2021-02-26 HISTORY — DX: Unspecified osteoarthritis, unspecified site: M19.90

## 2021-02-26 HISTORY — DX: Hypothyroidism, unspecified: E03.9

## 2021-02-26 HISTORY — DX: Essential (primary) hypertension: I10

## 2021-02-26 HISTORY — DX: Other chronic pain: G89.29

## 2021-02-26 HISTORY — DX: Hyperlipidemia, unspecified: E78.5

## 2021-02-26 LAB — COMPREHENSIVE METABOLIC PANEL
ALT: 17 U/L (ref 0–44)
AST: 18 U/L (ref 15–41)
Albumin: 4.4 g/dL (ref 3.5–5.0)
Alkaline Phosphatase: 54 U/L (ref 38–126)
Anion gap: 10 (ref 5–15)
BUN: 29 mg/dL — ABNORMAL HIGH (ref 8–23)
CO2: 32 mmol/L (ref 22–32)
Calcium: 10 mg/dL (ref 8.9–10.3)
Chloride: 98 mmol/L (ref 98–111)
Creatinine, Ser: 0.76 mg/dL (ref 0.44–1.00)
GFR, Estimated: >60 mL/min (ref >60)
Glucose, Bld: 71 mg/dL (ref 70–99)
Potassium: 3.6 mmol/L (ref 3.5–5.1)
Sodium: 140 mmol/L (ref 135–145)
Total Bilirubin: 0.8 mg/dL (ref 0.3–1.2)
Total Protein: 7.8 g/dL (ref 6.5–8.1)

## 2021-02-26 LAB — CBC
HCT: 48.4 % — ABNORMAL HIGH (ref 36.0–46.0)
Hemoglobin: 15.2 g/dL — ABNORMAL HIGH (ref 12.0–15.0)
MCH: 28.4 pg (ref 26.0–34.0)
MCHC: 31.4 g/dL (ref 30.0–36.0)
MCV: 90.5 fL (ref 80.0–100.0)
Platelets: 231 10*3/uL (ref 150–400)
RBC: 5.35 MIL/uL — ABNORMAL HIGH (ref 3.87–5.11)
RDW: 12.8 % (ref 11.5–15.5)
WBC: 10.1 10*3/uL (ref 4.0–10.5)
nRBC: 0 % (ref 0.0–0.2)

## 2021-02-26 LAB — PROTIME-INR
INR: 1 (ref 0.8–1.2)
Prothrombin Time: 12.7 seconds (ref 11.4–15.2)

## 2021-02-26 LAB — SURGICAL PCR SCREEN
MRSA, PCR: POSITIVE — AB
Staphylococcus aureus: POSITIVE — AB

## 2021-02-26 LAB — HEMOGLOBIN A1C
Hgb A1c MFr Bld: 7.1 % — ABNORMAL HIGH (ref 4.8–5.6)
Mean Plasma Glucose: 157.07 mg/dL

## 2021-02-26 LAB — TYPE AND SCREEN
ABO/RH(D): A NEG
Antibody Screen: NEGATIVE

## 2021-02-26 LAB — GLUCOSE, CAPILLARY: Glucose-Capillary: 100 mg/dL — ABNORMAL HIGH (ref 70–99)

## 2021-03-02 NOTE — Progress Notes (Signed)
Anesthesia Chart Review   Case: [id number] Date/Time: 03/10/21 0715   Procedure: TOTAL HIP ARTHROPLASTY ANTERIOR APPROACH (Right: Hip)   Anesthesia type: Choice   Pre-op diagnosis: right hip osteoarthritis   Location: [facility] ROOM 10 / [facility]   Surgeons: Ollen Gross, MD       DISCUSSION:65 y.o. 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 Ollen Gross.   Clearance received from PCP which states pt is moderate risk due to insulin dependent diabetes, HTN, and h/o cardiac ablation due to WPW.   Anticipate pt can proceed with planned procedure barring acute status change.   VS: BP 107/70   Pulse 75   Temp 36.6 C (Oral)   Resp 16   Ht 5\' 3"  (1.6 m)   SpO2 100%   PROVIDERS: , FNP is PCP    LABS: Labs reviewed: Acceptable for surgery. (all labs ordered are listed, but only abnormal results are displayed)  Labs Reviewed  SURGICAL PCR SCREEN - Abnormal; Notable for the following components:      Result Value   MRSA, PCR POSITIVE (*)    Staphylococcus aureus POSITIVE (*)    All other components within normal limits  CBC - Abnormal; Notable for the following components:   RBC 5.35 (*)    Hemoglobin 15.2 (*)    HCT 48.4 (*)    All other components within normal limits  COMPREHENSIVE METABOLIC PANEL - Abnormal; Notable for the following components:   BUN 29 (*)    All other components within normal limits  HEMOGLOBIN A1C - Abnormal; Notable for the following components:   Hgb A1c MFr Bld 7.1 (*)    All other components within normal limits  GLUCOSE, CAPILLARY - Abnormal; Notable for the following components:   Glucose-Capillary 100 (*)    All other components within normal limits  PROTIME-INR  TYPE AND SCREEN     IMAGES:   EKG: 02/26/2021 Rate 69 bpm  Normal sinus rhythm Right bundle branch block T wave  abnormality, consider inferior ischemia Abnormal ECG Right bundle branch block is new  CV: *TTE 08/25/17: 1. Left ventricle: The left ventricular cavity size is normal. Wall thickness is normal. Systolic function is normal. The ejection fraction is 60%. No segmental wall motion abnormalities. Inconclusive diastolic evaluation due to insufficient data. 2. Right atrium: The right atrium is mildly dilated. 3. Tricuspid valve: There is mild tricuspid regurgitation. 4. Pulmonary arteries: Pulmonary artery estimated peak systolic pressure: 43 mm Hg. This is consistent with mild to moderate pulmonary hypertension.  Lexiscan Cardiolite stress test performed at [facility] on 03/07/2017 showed normal perfusion with no defects to suggest ischemia or scar, EF 65%.  Cardiac Cath 04/09/2012 CONCLUSIONS:        CORONARY STATUS: Normal          LV FUNCTION:               Ejection Fraction:   55%               Wall Motion:         Normal            OTHER:  Widely patent coronaries.  Normal left main.  Normal left ventricular function.   Echo 04/08/2012 SUMMARY  Overall normal valvular appearance and function without significant stenosis  or regurgitation.  The left ventricular size is  normal.  There is normal left ventricular wall thickness.  Past Medical History:  Diagnosis Date   Anxiety    Asthma    Chronic pain    Depression    Diabetes mellitus without complication (HCC)    GERD (gastroesophageal reflux disease)    Headache    migraines   Hyperlipidemia    Hypertension    Hypothyroidism    Osteoarthritis    PONV (postoperative nausea and vomiting)     Past Surgical History:  Procedure Laterality Date   ABDOMINAL HYSTERECTOMY     ANKLE RECONSTRUCTION Left    1977   APPENDECTOMY     CARPAL TUNNEL RELEASE Right    2010   catheter ablation     1993   CESAREAN SECTION     x2   CHOLECYSTECTOMY     HERNIA REPAIR     KNEE ARTHROSCOPY W/ MENISCECTOMY Right    2008    KNEE DISLOCATION SURGERY Right    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     2019   vaginal fistula of sigmoid colon      MEDICATIONS:  aspirin EC 81 MG tablet   Biotin 10626 MCG TABS   buPROPion (WELLBUTRIN SR) 150 MG 12 hr tablet   busPIRone (BUSPAR) 10 MG tablet   chlorthalidone (HYGROTON) 25 MG tablet   Cholecalciferol 25 MCG (1000 UT) tablet   COLLAGEN PO   cyanocobalamin 2000 MCG tablet   estradiol (ESTRACE) 0.5 MG tablet   ferrous gluconate (FERGON) 324 MG tablet   furosemide (LASIX) 20 MG tablet   gabapentin (NEURONTIN) 600 MG tablet   insulin aspart (NOVOLOG) 100 UNIT/ML injection   insulin glargine (LANTUS) 100 UNIT/ML injection   levocetirizine (XYZAL) 5 MG tablet   levothyroxine (SYNTHROID) 175 MCG tablet   lovastatin (MEVACOR) 40 MG tablet   meclizine (ANTIVERT) 25 MG tablet   metFORMIN (GLUCOPHAGE) 500 MG tablet   montelukast (SINGULAIR) 10 MG tablet   ondansetron (ZOFRAN) 4 MG tablet   oxycodone-acetaminophen (LYNOX) 10-300 MG tablet   rizatriptan (MAXALT) 10 MG tablet   tiZANidine (ZANAFLEX) 4 MG capsule   valACYclovir (VALTREX) 500 MG tablet   vitamin B-12 (CYANOCOBALAMIN) 500 MCG tablet   zinc gluconate 50 MG tablet   No current facility-administered medications for this encounter.    Jodell Cipro Ward, PA-C [facility] Pre-Surgical Testing [phone]

## 2021-03-02 NOTE — Anesthesia Preprocedure Evaluation (Addendum)
Anesthesia Evaluation  Patient identified by MRN, date of birth, ID band Patient awake    Reviewed: Allergy & Precautions, NPO status , Patient's Chart, lab work & pertinent test results  History of Anesthesia Complications (+) PONV and history of anesthetic complications  Airway Mallampati: III  TM Distance: >3 FB Neck ROM: Full    Dental  (+) Teeth Intact, Dental Advisory Given, Missing,    Pulmonary asthma (only uses rescue inhaler 1-2x/yr) ,  Had sleep study neg   Pulmonary exam normal breath sounds clear to auscultation       Cardiovascular hypertension, Pt. on medications Normal cardiovascular exam Rhythm:Regular Rate:Normal     Neuro/Psych  Headaches, PSYCHIATRIC DISORDERS Anxiety Depression    GI/Hepatic Neg liver ROS, GERD  Controlled and Medicated,  Endo/Other  diabetes, Well Controlled, Type 2, Insulin DependentHypothyroidism FS 122 Lantus 1/2 dose last night  Renal/GU negative Renal ROS  negative genitourinary   Musculoskeletal  (+) Arthritis , Osteoarthritis,    Abdominal   Peds  Hematology negative hematology ROS (+) hct 43.8, plt 252   Anesthesia Other Findings Oxy 10 QID  Has been on chronic narcotic narcotics for >15 yrs  Reproductive/Obstetrics negative OB ROS                            Anesthesia Physical Anesthesia Plan  ASA: 2  Anesthesia Plan: Spinal   Post-op Pain Management:    Induction:   PONV Risk Score and Plan: 3 and Treatment may vary due to age or medical condition, Propofol infusion and TIVA  Airway Management Planned: Natural Airway and Simple Face Mask  Additional Equipment: None  Intra-op Plan:   Post-operative Plan:   Informed Consent: I have reviewed the patients History and Physical, chart, labs and discussed the procedure including the risks, benefits and alternatives for the proposed anesthesia with the patient or authorized  representative who has indicated his/her understanding and acceptance.     Dental advisory given  Plan Discussed with: CRNA  Anesthesia Plan Comments:        Anesthesia Quick Evaluation

## 2021-03-08 ENCOUNTER — Other Ambulatory Visit: Payer: Self-pay | Admitting: Orthopedic Surgery

## 2021-03-08 LAB — SARS CORONAVIRUS 2 (TAT 6-24 HRS): SARS Coronavirus 2: POSITIVE — AB

## 2021-03-08 NOTE — Progress Notes (Signed)
Called the on-call service for Dr. Despina Hick with + covid results for this pt. The pt is to have surgery on Wed. 03/10/21 at 0730 at [facility]. The pt has not been notified as there will be pertinent questions the provider needs to answer.   Positive Results for:  Asymptomatic: Procedure postponed and quarantined for 10 days, unless the procedure is urgent. Please follow the facility's protocol regarding pt and family arrival.  Symptomatic: Procedure postponed and quarantined for 14 days.  Hospitalized with Covid: postponed and quarantined  for 21 days.  Immunocompromised: Procedure postponed and quarantine for 20 days.  The pt will not be retested for 90 days from the + result.

## 2021-03-31 NOTE — Progress Notes (Addendum)
COVID swab appointment: N/a COVID pos 03/08/21  COVID Vaccine Completed: no Date COVID Vaccine completed: Has received booster: COVID vaccine manufacturer: Pfizer    [facility] [employer] [employer]   Date of COVID positive in last 90 days: yes positive 03/08/21  PCP - Wallace Cullens, FNP Cardiologist - n/a  Chest x-ray - n/a EKG - 02/26/21 Epic Stress Test - [facility] 5 years ago ECHO - [facility] Cardiac Cath - 2013 [facility] Pacemaker/ICD device last checked: n/a Spinal Cord Stimulator:n/a  Sleep Study - yes positive CPAP - no, lost weight  Fasting Blood Sugar - 90-120 Checks Blood Sugar _4_ times a day  Blood Thinner Instructions: Aspirin Instructions: ASA 81, hold 7 days Last Dose:  Activity level: Can go up a flight of stairs and perform activities of daily living without stopping and without symptoms of chest pain or shortness of breath. Difficulty with stairs due to to hip       Anesthesia review: RBBB, HTN, DM  Patient denies shortness of breath, fever, cough and chest pain at PAT appointment   Patient verbalized understanding of instructions that were given to them at the PAT appointment. Patient was also instructed that they will need to review over the PAT instructions again at home before surgery.

## 2021-03-31 NOTE — Patient Instructions (Addendum)
DUE TO COVID-19 ONLY ONE VISITOR IS ALLOWED TO COME WITH YOU AND STAY IN THE WAITING ROOM ONLY DURING PRE OP AND PROCEDURE.   **NO VISITORS ARE ALLOWED IN THE SHORT STAY AREA OR RECOVERY ROOM!!**  IF YOU WILL BE ADMITTED INTO THE HOSPITAL YOU ARE ALLOWED ONLY TWO SUPPORT PEOPLE DURING VISITATION HOURS ONLY (10AM -8PM)   The support person(s) may change daily. The support person(s) must pass our screening, gel in and out, and wear a mask at all times, including in the patient's room. Patients must also wear a mask when staff or their support person are in the room.  No visitors under the age of 27. Any visitor under the age of 4 must be accompanied by an adult.        Your procedure is scheduled on: 04/12/21   Report to [facility] Main Entrance    Report to admitting at 12:15 PM   Call this number if you have problems the morning of surgery [phone]   Do not eat food :After Midnight.   May have liquids until 12:00 PM day of surgery  CLEAR LIQUID DIET  Foods Allowed                                                                     Foods Excluded  Water, Black Coffee and tea (no milk or creamer)            liquids that you cannot  Plain Jell-O in any flavor  (No red)                                     see through such as: Fruit ices (not with fruit pulp)                                             milk, soups, orange juice              Iced Popsicles (No red)                                                All solid food                                   Apple juices Sports drinks like Gatorade (No red) Lightly seasoned clear broth or consume(fat free) Sugar     The day of surgery:  Drink ONE (1) Pre-Surgery G2 by 12:00 pm the morning of surgery. Drink in one sitting. Do not sip.  This drink was given to you during your hospital  pre-op appointment visit. Nothing else to drink after completing the  Pre-Surgery G2.          If you have questions, please  contact your surgeon's office.   Oral Hygiene is also important to reduce your risk of infection.  Remember - BRUSH YOUR TEETH THE MORNING OF SURGERY WITH YOUR REGULAR TOOTHPASTE   Take these medicines the morning of surgery with A SIP OF WATER: Wellbutrin, Buspar, Gabapentin, Levothyroxine, Singulair  DO NOT TAKE ANY ORAL DIABETIC MEDICATIONS DAY OF YOUR SURGERY  How to Manage Your Diabetes Before and After Surgery  Why is it important to control my blood sugar before and after surgery? Improving blood sugar levels before and after surgery helps healing and can limit problems. A way of improving blood sugar control is eating a healthy diet by:  Eating less sugar and carbohydrates  Increasing activity/exercise  Talking with your doctor about reaching your blood sugar goals High blood sugars (greater than 180 mg/dL) can raise your risk of infections and slow your recovery, so you will need to focus on controlling your diabetes during the weeks before surgery. Make sure that the doctor who takes care of your diabetes knows about your planned surgery including the date and location.  How do I manage my blood sugar before surgery? Check your blood sugar at least 4 times a day, starting 2 days before surgery, to make sure that the level is not too high or low. Check your blood sugar the morning of your surgery when you wake up and every 2 hours until you get to the Short Stay unit. If your blood sugar is less than 70 mg/dL, you will need to treat for low blood sugar: Do not take insulin. Treat a low blood sugar (less than 70 mg/dL) with  cup of clear juice (cranberry or apple), 4 glucose tablets, OR glucose gel. Recheck blood sugar in 15 minutes after treatment (to make sure it is greater than 70 mg/dL). If your blood sugar is not greater than 70 mg/dL on recheck, call [phone] for further instructions. Report your blood sugar to the short stay nurse when  you get to Short Stay.  If you are admitted to the hospital after surgery: Your blood sugar will be checked by the staff and you will probably be given insulin after surgery (instead of oral diabetes medicines) to make sure you have good blood sugar levels. The goal for blood sugar control after surgery is 80-180 mg/dL.   WHAT DO I DO ABOUT MY DIABETES MEDICATION?  Do not take oral diabetes medicines (pills) the morning of surgery.  THE DAY BEFORE SURGERY, take Metformin as prescribed. Take 13 units of Lantus. Take Novolog before meals as prescribed, no bedtime insulin    THE MORNING OF SURGERY, do not take Metformin. Take 50% of sliding scale insulin is blood sugar is greater than 220  If your CBG is greater than 220 mg/dL, you may take  of your sliding scale  (correction) dose of insulin.     Reviewed and Endorsed by [facility] Patient Education Committee, August 2015                               You may not have any metal on your body including hair pins, jewelry, and body piercing             Do not wear make-up, lotions, powders, perfumes, or deodorant  Do not wear nail polish including gel and S&S, artificial/acrylic nails, or any other type of covering on natural nails including finger and toenails. If you have artificial nails, gel coating, etc. that needs to be removed by a nail salon please have this removed prior to  surgery or surgery may need to be canceled/ delayed if the surgeon/ anesthesia feels like they are unable to be safely monitored.   Do not shave  48 hours prior to surgery.    Do not bring valuables to the hospital. [facility] IS NOT             RESPONSIBLE   FOR VALUABLES.   Bring small overnight bag day of surgery.   Special Instructions: Bring a copy of your healthcare power of attorney and living will documents         the day of surgery if you haven't scanned them before.   Please read over the following fact sheets you were given: IF YOU HAVE  QUESTIONS ABOUT YOUR PRE-OP INSTRUCTIONS PLEASE CALL [phone]- [facility] - Preparing for Surgery Before surgery, you can play an important role.  Because skin is not sterile, your skin needs to be as free of germs as possible.  You can reduce the number of germs on your skin by washing with CHG (chlorahexidine gluconate) soap before surgery.  CHG is an antiseptic cleaner which kills germs and bonds with the skin to continue killing germs even after washing. Please DO NOT use if you have an allergy to CHG or antibacterial soaps.  If your skin becomes reddened/irritated stop using the CHG and inform your nurse when you arrive at Short Stay. Do not shave (including legs and underarms) for at least 48 hours prior to the first CHG shower.  You may shave your face/neck.  Please follow these instructions carefully:  1.  Shower with CHG Soap the night before surgery and the  morning of surgery.  2.  If you choose to wash your hair, wash your hair first as usual with your normal  shampoo.  3.  After you shampoo, rinse your hair and body thoroughly to remove the shampoo.                             4.  Use CHG as you would any other liquid soap.  You can apply chg directly to the skin and wash.  Gently with a scrungie or clean washcloth.  5.  Apply the CHG Soap to your body ONLY FROM THE NECK DOWN.   Do   not use on face/ open                           Wound or open sores. Avoid contact with eyes, ears mouth and   genitals (private parts).                       Wash face,  Genitals (private parts) with your normal soap.             6.  Wash thoroughly, paying special attention to the area where your    surgery  will be performed.  7.  Thoroughly rinse your body with warm water from the neck down.  8.  DO NOT shower/wash with your normal soap after using and rinsing off the CHG Soap.                9.  Pat yourself dry with a clean towel.            10.  Wear clean pajamas.            11.  Place clean sheets on your bed the night of your first shower and do not  sleep with pets. Day of Surgery : Do not apply any lotions/deodorants the morning of surgery.  Please wear clean clothes to the hospital/surgery center.  FAILURE TO FOLLOW THESE INSTRUCTIONS MAY RESULT IN THE CANCELLATION OF YOUR SURGERY  PATIENT SIGNATURE_________________________________  NURSE SIGNATURE__________________________________  ________________________________________________________________________   [name]  An incentive spirometer is a tool that can help keep your lungs clear and active. This tool measures how well you are filling your lungs with each breath. Taking long deep breaths may help reverse or decrease the chance of developing breathing (pulmonary) problems (especially infection) following: A long period of time when you are unable to move or be active. BEFORE THE PROCEDURE  If the spirometer includes an indicator to show your best effort, your nurse or respiratory therapist will set it to a desired goal. If possible, sit up straight or lean slightly forward. Try not to slouch. Hold the incentive spirometer in an upright position. INSTRUCTIONS FOR USE  Sit on the edge of your bed if possible, or sit up as far as you can in bed or on a chair. Hold the incentive spirometer in an upright position. Breathe out normally. Place the mouthpiece in your mouth and seal your lips tightly around it. Breathe in slowly and as deeply as possible, raising the piston or the ball toward the top of the column. Hold your breath for 3-5 seconds or for as long as possible. Allow the piston or ball to fall to the bottom of the column. Remove the mouthpiece from your mouth and breathe out normally. Rest for a few seconds and repeat Steps 1 through 7 at least 10 times every 1-2 hours when you are awake. Take your time and take a few normal breaths between deep breaths. The spirometer may include an  indicator to show your best effort. Use the indicator as a goal to work toward during each repetition. After each set of 10 deep breaths, practice coughing to be sure your lungs are clear. If you have an incision (the cut made at the time of surgery), support your incision when coughing by placing a pillow or rolled up towels firmly against it. Once you are able to get out of bed, walk around indoors and cough well. You may stop using the incentive spirometer when instructed by your caregiver.  RISKS AND COMPLICATIONS Take your time so you do not get dizzy or light-headed. If you are in pain, you may need to take or ask for pain medication before doing incentive spirometry. It is harder to take a deep breath if you are having pain. AFTER USE Rest and breathe slowly and easily. It can be helpful to keep track of a log of your progress. Your caregiver can provide you with a simple table to help with this. If you are using the spirometer at home, follow these instructions: SEEK MEDICAL CARE IF:  You are having difficultly using the spirometer. You have trouble using the spirometer as often as instructed. Your pain medication is not giving enough relief while using the spirometer. You develop fever of 100.5 F (38.1 C) or higher. SEEK IMMEDIATE MEDICAL CARE IF:  You cough up bloody sputum that had not been present before. You develop fever of 102 F (38.9 C) or greater. You develop worsening pain at or near the incision site. MAKE SURE YOU:  Understand these instructions. Will watch your condition. Will get help  right away if you are not doing well or get worse. Document Released: 10/10/2006 Document Revised: 08/22/2011 Document Reviewed: 12/11/2006 [employer] Patient Information 2014 [employer], [place].   ________________________________________________________________________  WHAT IS A BLOOD TRANSFUSION? Blood Transfusion Information  A transfusion is the replacement of blood or some of its  parts. Blood is made up of multiple cells which provide different functions. Red blood cells carry oxygen and are used for blood loss replacement. White blood cells fight against infection. Platelets control bleeding. Plasma helps clot blood. Other blood products are available for specialized needs, such as hemophilia or other clotting disorders. BEFORE THE TRANSFUSION  Who gives blood for transfusions?  Healthy volunteers who are fully evaluated to make sure their blood is safe. This is blood bank blood. Transfusion therapy is the safest it has ever been in the practice of medicine. Before blood is taken from a donor, a complete history is taken to make sure that person has no history of diseases nor engages in risky social behavior (examples are intravenous drug use or sexual activity with multiple partners). The donor's travel history is screened to minimize risk of transmitting infections, such as malaria. The donated blood is tested for signs of infectious diseases, such as HIV and hepatitis. The blood is then tested to be sure it is compatible with you in order to minimize the chance of a transfusion reaction. If you or a relative donates blood, this is often done in anticipation of surgery and is not appropriate for emergency situations. It takes many days to process the donated blood. RISKS AND COMPLICATIONS Although transfusion therapy is very safe and saves many lives, the main dangers of transfusion include:  Getting an infectious disease. Developing a transfusion reaction. This is an allergic reaction to something in the blood you were given. Every precaution is taken to prevent this. The decision to have a blood transfusion has been considered carefully by your caregiver before blood is given. Blood is not given unless the benefits outweigh the risks. AFTER THE TRANSFUSION Right after receiving a blood transfusion, you will usually feel much better and more energetic. This is especially  true if your red blood cells have gotten low (anemic). The transfusion raises the level of the red blood cells which carry oxygen, and this usually causes an energy increase. The nurse administering the transfusion will monitor you carefully for complications. HOME CARE INSTRUCTIONS  No special instructions are needed after a transfusion. You may find your energy is better. Speak with your caregiver about any limitations on activity for underlying diseases you may have. SEEK MEDICAL CARE IF:  Your condition is not improving after your transfusion. You develop redness or irritation at the intravenous (IV) site. SEEK IMMEDIATE MEDICAL CARE IF:  Any of the following symptoms occur over the next 12 hours: Shaking chills. You have a temperature by mouth above 102 F (38.9 C), not controlled by medicine. Chest, back, or muscle pain. People around you feel you are not acting correctly or are confused. Shortness of breath or difficulty breathing. Dizziness and fainting. You get a rash or develop hives. You have a decrease in urine output. Your urine turns a dark color or changes to pink, red, or brown. Any of the following symptoms occur over the next 10 days: You have a temperature by mouth above 102 F (38.9 C), not controlled by medicine. Shortness of breath. Weakness after normal activity. The white part of the eye turns yellow (jaundice). You have a decrease in the amount  of urine or are urinating less often. Your urine turns a dark color or changes to pink, red, or brown. Document Released: 05/27/2000 Document Revised: 08/22/2011 Document Reviewed: 01/14/2008 [facility] Patient Information 2014 [place], [place].  _______________________________________________________________________

## 2021-04-05 ENCOUNTER — Encounter (HOSPITAL_COMMUNITY)
Admission: RE | Admit: 2021-04-05 | Discharge: 2021-04-05 | Disposition: A | Discharge status: Home / Self Care | Payer: Medicare HMO | Source: Ambulatory Visit | Attending: Orthopedic Surgery | Admitting: Orthopedic Surgery

## 2021-04-05 ENCOUNTER — Encounter (HOSPITAL_COMMUNITY): Payer: Self-pay

## 2021-04-05 ENCOUNTER — Other Ambulatory Visit: Payer: Self-pay

## 2021-04-05 VITALS — BP 119/76 | HR 73 | Temp 98.6°F | Resp 16 | Ht 63.0 in | Wt 138.2 lb

## 2021-04-05 DIAGNOSIS — I2510 Atherosclerotic heart disease of native coronary artery without angina pectoris: Secondary | ICD-10-CM | POA: Insufficient documentation

## 2021-04-05 DIAGNOSIS — E119 Type 2 diabetes mellitus without complications: Secondary | ICD-10-CM

## 2021-04-05 DIAGNOSIS — Z01812 Encounter for preprocedural laboratory examination: Secondary | ICD-10-CM | POA: Insufficient documentation

## 2021-04-05 DIAGNOSIS — Z01818 Encounter for other preprocedural examination: Secondary | ICD-10-CM

## 2021-04-05 LAB — CBC
HCT: 43.8 % (ref 36.0–46.0)
Hemoglobin: 14.2 g/dL (ref 12.0–15.0)
MCH: 29 pg (ref 26.0–34.0)
MCHC: 32.4 g/dL (ref 30.0–36.0)
MCV: 89.4 fL (ref 80.0–100.0)
Platelets: 252 10*3/uL (ref 150–400)
RBC: 4.9 MIL/uL (ref 3.87–5.11)
RDW: 13.3 % (ref 11.5–15.5)
WBC: 9 10*3/uL (ref 4.0–10.5)
nRBC: 0 % (ref 0.0–0.2)

## 2021-04-05 LAB — BASIC METABOLIC PANEL
Anion gap: 10 (ref 5–15)
BUN: 17 mg/dL (ref 8–23)
CO2: 31 mmol/L (ref 22–32)
Calcium: 9.5 mg/dL (ref 8.9–10.3)
Chloride: 98 mmol/L (ref 98–111)
Creatinine, Ser: 0.56 mg/dL (ref 0.44–1.00)
GFR, Estimated: >60 mL/min (ref >60)
Glucose, Bld: 84 mg/dL (ref 70–99)
Potassium: 3.8 mmol/L (ref 3.5–5.1)
Sodium: 139 mmol/L (ref 135–145)

## 2021-04-05 LAB — SURGICAL PCR SCREEN
MRSA, PCR: POSITIVE — AB
Staphylococcus aureus: POSITIVE — AB

## 2021-04-05 LAB — GLUCOSE, CAPILLARY: Glucose-Capillary: 93 mg/dL (ref 70–99)

## 2021-04-06 NOTE — Progress Notes (Signed)
STAPH and MRSA positive. Results routed to Dr. Lequita Halt

## 2021-04-07 NOTE — H&P (Signed)
TOTAL HIP ADMISSION H&P  Patient is admitted for right total hip arthroplasty.  Subjective:  Chief Complaint: Right hip pain  HPI: [name], 65 y.o. female, has a history of pain and functional disability in the right hip due to arthritis and patient has failed non-surgical conservative treatments for greater than 12 weeks to include NSAID's and/or analgesics, flexibility and strengthening excercises, and activity modification. Onset of symptoms was gradual, starting  several  years ago with gradually worsening course since that time. The patient noted no past surgery on the right hip. Patient currently rates pain in the right hip at 7 out of 10 with activity. Patient has worsening of pain with activity and weight bearing, pain that interfers with activities of daily living, pain with passive range of motion, and crepitus. Patient has evidence of periarticular osteophytes and joint space narrowing by imaging studies. This condition presents safety issues increasing the risk of falls. There is no current active infection.  There are no problems to display for this patient.   Past Medical History:  Diagnosis Date   Anxiety    Asthma    Chronic pain    Depression    Diabetes mellitus without complication (HCC)    GERD (gastroesophageal reflux disease)    Headache    migraines   Hyperlipidemia    Hypertension    Hypothyroidism    Osteoarthritis    PONV (postoperative nausea and vomiting)     Past Surgical History:  Procedure Laterality Date   ABDOMINAL HYSTERECTOMY     ANKLE RECONSTRUCTION Left    1977   APPENDECTOMY     CARPAL TUNNEL RELEASE Right    2010   catheter ablation     1993   CESAREAN SECTION     x2   CHOLECYSTECTOMY     HERNIA REPAIR     KNEE ARTHROSCOPY W/ MENISCECTOMY Right    2008   KNEE DISLOCATION SURGERY Right    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 Authorizing Provider  aspirin EC 81 MG tablet Take 81 mg by mouth daily. Swallow whole.    [provider]  Biotin 38756 MCG TABS Take 10,000 mcg by mouth daily.    [provider]  buPROPion (WELLBUTRIN SR) 150 MG 12 hr tablet Take 150 mg by mouth 2 (two) times daily.    [provider]  busPIRone (BUSPAR) 10 MG tablet Take 10 mg by mouth 2 (two) times daily.    [provider]  chlorthalidone (HYGROTON) 25 MG tablet Take 25 mg by mouth daily.    [provider]  Cholecalciferol 25 MCG (1000 UT) tablet Take by mouth.    [provider]  COLLAGEN PO Take 6,000 mg by mouth daily.    [provider]  cyanocobalamin 2000 MCG tablet Take 2,000 mcg by mouth daily.    [provider]  estradiol (ESTRACE) 0.5 MG tablet Take 0.5 mg by mouth daily.    [provider]  ferrous gluconate (FERGON) 324 MG tablet Take 324 mg by mouth daily with breakfast.    [provider]  furosemide (LASIX) 20 MG tablet Take 20 mg by mouth every Monday, Wednesday, and Friday.    [provider]  gabapentin (NEURONTIN) 600 MG tablet Take 600 mg by mouth in the morning, at noon, in the  evening, and at bedtime.    [provider]  insulin aspart (NOVOLOG) 100 UNIT/ML injection Inject 5-8 Units into the skin 3 (three) times daily before meals.    [provider]  insulin glargine (LANTUS) 100 UNIT/ML injection Inject 26 Units into the skin at bedtime.    [provider]  levocetirizine (XYZAL) 5 MG tablet Take 5 mg by mouth every evening.    [provider]  levothyroxine (SYNTHROID) 175 MCG tablet Take 175 mcg by mouth daily before breakfast.    [provider]  lovastatin (MEVACOR) 40 MG tablet Take 40 mg by mouth at bedtime.    [provider]  meclizine (ANTIVERT) 25 MG tablet Take 25 mg by mouth 3 (three) times daily as needed for dizziness.    [provider]  metFORMIN (GLUCOPHAGE) 500 MG tablet Take 500 mg by mouth daily with breakfast.    [provider]  montelukast (SINGULAIR) 10 MG tablet Take 10 mg by mouth daily.    [provider]  ondansetron (ZOFRAN) 4 MG tablet Take 4 mg by mouth 3 (three) times daily as needed for refractory nausea / vomiting.    [provider]  oxycodone-acetaminophen (LYNOX) 10-300 MG tablet Take 1 tablet by mouth in the morning, at noon, in the evening, and at bedtime.    [provider]  rizatriptan (MAXALT) 10 MG tablet Take 10 mg by mouth every 6 (six) hours as needed for migraine. May repeat in 2 hours if needed    [provider]  tiZANidine (ZANAFLEX) 4 MG capsule Take 4 mg by mouth in the morning and at bedtime.    [provider]  valACYclovir (VALTREX) 500 MG tablet Take 500 mg by mouth 2 (two) times daily as needed (shingles).    [provider]  vitamin B-12 (CYANOCOBALAMIN) 500 MCG tablet Take 500 mcg by mouth daily.    [provider]  zinc gluconate 50 MG tablet Take 50 mg by mouth daily.    [provider]    Allergies  Allergen Reactions   Hydromorphone Shortness Of Breath    Respiratory arrest per patient   Penicillins Anaphylaxis   Erythromycin Base Nausea And Vomiting   Meperidine Hives    Social History   Socioeconomic History   Marital status: Married    Spouse name: Not on file   Number of children: Not on file   Years of education: Not on file   Highest education level: Not on file  Occupational History   Not on file  Tobacco Use   Smoking status: Never   Smokeless tobacco: Never  Vaping Use   Vaping Use: Never used  Substance and Sexual Activity   Alcohol use: Never   Drug use: Never   Sexual activity: Not on file  Other Topics Concern   Not on file  Social History Narrative   Not on file   Social Determinants of Health   Financial Resource Strain: Not on file  Food  Insecurity: Not on file  Transportation Needs: Not on file  Physical Activity: Not on file  Stress: Not on file  Social Connections: Not on file  Intimate Partner Violence: Not on file    Tobacco Use: Low Risk    Smoking Tobacco Use: Never   Smokeless Tobacco Use: Never   Passive Exposure: Not on file   Social History   Substance and Sexual Activity  Alcohol Use Never    No family history on  file.  ROS: Constitutional: no fever, no chills, no night sweats, no significant weight loss Cardiovascular: no chest pain, no palpitations Respiratory: no cough, no shortness of breath, No COPD Gastrointestinal: no vomiting, no nausea Musculoskeletal: no swelling in Joints, Joint Pain Neurologic: no numbness, no tingling, no difficulty with balance    Objective:  Physical Exam: Well nourished and well developed.  General: Alert and oriented x3, cooperative and pleasant, no acute distress.  Head: normocephalic, atraumatic, neck supple.  Eyes: EOMI.  Respiratory: breath sounds clear in all fields, no wheezing, rales, or rhonchi. Cardiovascular: Regular rate and rhythm, no murmurs, gallops or rubs.  Abdomen: non-tender to palpation and soft, normoactive bowel sounds. Musculoskeletal:   Right Hip Exam:   Range of motion: Flexion to 100 degrees, internal rotation to 0 degrees, external rotation to 10 to 20 degrees, and abduction to 20 degrees with pain on range of motion.   There is no tenderness over the greater trochanteric bursa.     Right Knee Exam:   No effusion present. No swelling present.   Range of motion: 10 to 110 degrees.   Moderate crepitus on range of motion of the knee.   Positive medial joint line tenderness.   No lateral joint line tenderness.   The knee is stable.     Left Hip Exam:   Range of motion: Normal without discomfort.   There is no tenderness over the greater trochanteric bursa.    Calves soft and nontender. Motor function intact in LE. Strength  5/5 LE bilaterally. Neuro: Distal pulses 2+. Sensation to light touch intact in LE.   Vital signs in last 24 hours:    Imaging Review Radiographs - AP of bilateral knees and lateral of the right knee dated 10/2020 demonstrate significant medial joint space narrowing, close to bone on bone, with patellofemoral narrowing also.   AP pelvis shows bone on bone arthritis right hip  Assessment/Plan:  End stage arthritis, right hip  The patient history, physical examination, clinical judgement of the provider and imaging studies are consistent with end stage degenerative joint disease of the right hip and total hip arthroplasty is deemed medically necessary. The treatment options including medical management, injection therapy, arthroscopy and arthroplasty were discussed at length. The risks and benefits of total hip arthroplasty were presented and reviewed. The risks due to aseptic loosening, infection, stiffness, dislocation/subluxation, thromboembolic complications and other imponderables were discussed. The patient acknowledged the explanation, agreed to proceed with the plan and consent was signed. Patient is being admitted for inpatient treatment for surgery, pain control, PT, OT, prophylactic antibiotics, VTE prophylaxis, progressive ambulation and ADLs and discharge planning.The patient is planning to be discharged  home .   Patient's anticipated LOS is less than 2 midnights, meeting these requirements: - Younger than 41 - Lives within 1 hour of care - Has a competent adult at home to recover with post-op - NO history of  - Chronic pain requiring opioids  - Diabetes  - Coronary Artery Disease  - Heart failure  - Heart attack  - Stroke  - DVT/VTE  - Cardiac arrhythmia  - Respiratory Failure/COPD  - Renal failure  - Anemia  - Advanced Liver disease  Therapy Plans: HEP Disposition: Home with husband Planned DVT Prophylaxis: Aspirin 325mg   DME Needed: None PCP: at  [facility] in [place] (clearance not received - patient contacting today) TXA: IV Allergies: Erythromycin, Hydromorphone, Meperidine, PCN (Hives and hard to breathe), Victoza Anesthesia Concerns: N/V - Has  had Phenergan and Zofran in the past - both effective) BMI: 36.7 Last HgbA1c: 7.6  Pharmacy: [facility] in [address]   Other: Has Wolf-Parkinson-White Syndrome    - Patient was instructed on what medications to stop prior to surgery. - Follow-up visit in 2 weeks with Dr. Lequita Halt - Begin physical therapy following surgery - Pre-operative lab work as pre-surgical testing - Prescriptions will be provided in hospital at time of discharge  Nelia Shi, [facility], PA-C Orthopedic Surgery [facility] [place]

## 2021-04-12 ENCOUNTER — Ambulatory Visit (HOSPITAL_COMMUNITY): Payer: Medicare HMO | Admitting: Registered Nurse

## 2021-04-12 ENCOUNTER — Ambulatory Visit (HOSPITAL_COMMUNITY): Payer: Medicare HMO

## 2021-04-12 ENCOUNTER — Observation Stay (HOSPITAL_COMMUNITY)
Admission: RE | Admit: 2021-04-12 | Discharge: 2021-04-14 | Disposition: A | Discharge status: Home / Self Care | Payer: Medicare HMO | Attending: Orthopedic Surgery | Admitting: Orthopedic Surgery

## 2021-04-12 ENCOUNTER — Encounter (HOSPITAL_COMMUNITY): Payer: Self-pay | Admitting: Orthopedic Surgery

## 2021-04-12 ENCOUNTER — Other Ambulatory Visit: Payer: Self-pay

## 2021-04-12 ENCOUNTER — Encounter (HOSPITAL_COMMUNITY)
Admission: RE | Disposition: A | Discharge status: Home / Self Care | Payer: Self-pay | Source: Home / Self Care | Attending: Orthopedic Surgery

## 2021-04-12 ENCOUNTER — Observation Stay (HOSPITAL_COMMUNITY): Payer: Medicare HMO

## 2021-04-12 ENCOUNTER — Ambulatory Visit (HOSPITAL_COMMUNITY): Payer: Medicare HMO | Admitting: Physician Assistant

## 2021-04-12 DIAGNOSIS — M1611 Unilateral primary osteoarthritis, right hip: Principal | ICD-10-CM | POA: Diagnosis present

## 2021-04-12 DIAGNOSIS — E119 Type 2 diabetes mellitus without complications: Secondary | ICD-10-CM | POA: Diagnosis not present

## 2021-04-12 DIAGNOSIS — Z7984 Long term (current) use of oral hypoglycemic drugs: Secondary | ICD-10-CM | POA: Diagnosis not present

## 2021-04-12 DIAGNOSIS — M169 Osteoarthritis of hip, unspecified: Secondary | ICD-10-CM | POA: Diagnosis present

## 2021-04-12 DIAGNOSIS — Z7982 Long term (current) use of aspirin: Secondary | ICD-10-CM | POA: Diagnosis not present

## 2021-04-12 DIAGNOSIS — J45909 Unspecified asthma, uncomplicated: Secondary | ICD-10-CM | POA: Diagnosis not present

## 2021-04-12 DIAGNOSIS — Z794 Long term (current) use of insulin: Secondary | ICD-10-CM | POA: Diagnosis not present

## 2021-04-12 DIAGNOSIS — E039 Hypothyroidism, unspecified: Secondary | ICD-10-CM | POA: Insufficient documentation

## 2021-04-12 DIAGNOSIS — I10 Essential (primary) hypertension: Secondary | ICD-10-CM | POA: Diagnosis not present

## 2021-04-12 DIAGNOSIS — Z79899 Other long term (current) drug therapy: Secondary | ICD-10-CM | POA: Insufficient documentation

## 2021-04-12 DIAGNOSIS — Z419 Encounter for procedure for purposes other than remedying health state, unspecified: Secondary | ICD-10-CM

## 2021-04-12 DIAGNOSIS — Z96649 Presence of unspecified artificial hip joint: Secondary | ICD-10-CM

## 2021-04-12 HISTORY — PX: TOTAL HIP ARTHROPLASTY: SHX124

## 2021-04-12 LAB — TYPE AND SCREEN
ABO/RH(D): A NEG
Antibody Screen: NEGATIVE

## 2021-04-12 LAB — GLUCOSE, CAPILLARY
Glucose-Capillary: 122 mg/dL — ABNORMAL HIGH (ref 70–99)
Glucose-Capillary: 106 mg/dL — ABNORMAL HIGH (ref 70–99)
Glucose-Capillary: 275 mg/dL — ABNORMAL HIGH (ref 70–99)

## 2021-04-12 SURGERY — ARTHROPLASTY, HIP, TOTAL, ANTERIOR APPROACH
Anesthesia: Spinal | Site: Hip | Laterality: Right

## 2021-04-12 MED ORDER — CHLORTHALIDONE 25 MG PO TABS
25.0000 mg | ORAL_TABLET | Freq: Every day | ORAL | Status: DC
  Administered 2021-04-13 – 2021-04-14 (×2): 25 mg via ORAL
  Filled 2021-04-12 (×2): qty 1

## 2021-04-12 MED ORDER — TIZANIDINE HCL 4 MG PO TABS
4.0000 mg | ORAL_TABLET | Freq: Three times a day (TID) | ORAL | Status: DC | PRN
  Administered 2021-04-12 – 2021-04-14 (×4): 4 mg via ORAL
  Filled 2021-04-12 (×4): qty 1

## 2021-04-12 MED ORDER — BUSPIRONE HCL 10 MG PO TABS
10.0000 mg | ORAL_TABLET | Freq: Two times a day (BID) | ORAL | Status: DC
  Administered 2021-04-12 – 2021-04-14 (×4): 10 mg via ORAL
  Filled 2021-04-12 (×4): qty 1

## 2021-04-12 MED ORDER — MIDAZOLAM HCL 2 MG/2ML IJ SOLN
INTRAMUSCULAR | Status: AC
  Filled 2021-04-12: qty 2

## 2021-04-12 MED ORDER — MENTHOL 3 MG MT LOZG: 1.0000 | LOZENGE | OROMUCOSAL | Status: DC | PRN

## 2021-04-12 MED ORDER — SODIUM CHLORIDE 0.9 % IV SOLN: INTRAVENOUS | Status: DC

## 2021-04-12 MED ORDER — BUPIVACAINE HCL 0.25 % IJ SOLN
INTRAMUSCULAR | Status: DC | PRN
  Administered 2021-04-12: 30 mL

## 2021-04-12 MED ORDER — OXYCODONE HCL 5 MG PO TABS
5.0000 mg | ORAL_TABLET | Freq: Four times a day (QID) | ORAL | Status: DC | PRN
  Administered 2021-04-13 – 2021-04-14 (×5): 10 mg via ORAL
  Filled 2021-04-12 (×7): qty 2

## 2021-04-12 MED ORDER — GABAPENTIN 300 MG PO CAPS
600.0000 mg | ORAL_CAPSULE | Freq: Three times a day (TID) | ORAL | Status: DC
  Administered 2021-04-12 – 2021-04-14 (×5): 600 mg via ORAL
  Filled 2021-04-12 (×5): qty 2

## 2021-04-12 MED ORDER — OXYCODONE HCL 5 MG PO TABS
ORAL_TABLET | ORAL | Status: AC
  Administered 2021-04-12: 15 mg via ORAL
  Filled 2021-04-12: qty 1

## 2021-04-12 MED ORDER — LACTATED RINGERS IV SOLN: INTRAVENOUS | Status: DC

## 2021-04-12 MED ORDER — CHLORHEXIDINE GLUCONATE 0.12 % MT SOLN: 15.0000 mL | Freq: Once | OROMUCOSAL | Status: DC

## 2021-04-12 MED ORDER — METOCLOPRAMIDE HCL 5 MG PO TABS: 5.0000 mg | ORAL_TABLET | Freq: Three times a day (TID) | ORAL | Status: DC | PRN

## 2021-04-12 MED ORDER — MECLIZINE HCL 25 MG PO TABS: 25.0000 mg | ORAL_TABLET | Freq: Three times a day (TID) | ORAL | Status: DC | PRN

## 2021-04-12 MED ORDER — OXYCODONE-ACETAMINOPHEN 10-300 MG PO TABS: 1.0000 | ORAL_TABLET | Freq: Three times a day (TID) | ORAL | Status: DC | PRN

## 2021-04-12 MED ORDER — INSULIN GLARGINE-YFGN 100 UNIT/ML ~~LOC~~ SOLN
26.0000 [IU] | Freq: Every day | SUBCUTANEOUS | Status: DC
  Administered 2021-04-13 (×2): 26 [IU] via SUBCUTANEOUS
  Filled 2021-04-12 (×2): qty 0.26

## 2021-04-12 MED ORDER — POLYETHYLENE GLYCOL 3350 17 G PO PACK: 17.0000 g | PACK | Freq: Every day | ORAL | Status: DC | PRN

## 2021-04-12 MED ORDER — PRAVASTATIN SODIUM 20 MG PO TABS
40.0000 mg | ORAL_TABLET | Freq: Every day | ORAL | Status: DC
  Administered 2021-04-13: 40 mg via ORAL
  Filled 2021-04-12: qty 2

## 2021-04-12 MED ORDER — ORAL CARE MOUTH RINSE: 15.0000 mL | Freq: Once | OROMUCOSAL | Status: AC

## 2021-04-12 MED ORDER — EPHEDRINE 5 MG/ML INJ
INTRAVENOUS | Status: AC
  Filled 2021-04-12: qty 5

## 2021-04-12 MED ORDER — ONDANSETRON HCL 4 MG/2ML IJ SOLN
INTRAMUSCULAR | Status: AC
  Filled 2021-04-12: qty 2

## 2021-04-12 MED ORDER — ACETAMINOPHEN 10 MG/ML IV SOLN
1000.0000 mg | Freq: Four times a day (QID) | INTRAVENOUS | Status: DC
  Administered 2021-04-12: 1000 mg via INTRAVENOUS
  Filled 2021-04-12: qty 100

## 2021-04-12 MED ORDER — OXYCODONE HCL 5 MG PO TABS
ORAL_TABLET | ORAL | Status: AC
  Filled 2021-04-12: qty 2

## 2021-04-12 MED ORDER — MEPERIDINE HCL 50 MG/ML IJ SOLN: 6.2500 mg | INTRAMUSCULAR | Status: DC | PRN

## 2021-04-12 MED ORDER — MEPIVACAINE HCL (PF) 2 % IJ SOLN
INTRAMUSCULAR | Status: DC | PRN
  Administered 2021-04-12: 3.5 mL via INTRATHECAL

## 2021-04-12 MED ORDER — TRANEXAMIC ACID-NACL 1000-0.7 MG/100ML-% IV SOLN
1000.0000 mg | INTRAVENOUS | Status: AC
  Administered 2021-04-12: 1000 mg via INTRAVENOUS
  Filled 2021-04-12: qty 100

## 2021-04-12 MED ORDER — VANCOMYCIN HCL IN DEXTROSE 1-5 GM/200ML-% IV SOLN
1000.0000 mg | Freq: Two times a day (BID) | INTRAVENOUS | Status: AC
  Administered 2021-04-13: 1000 mg via INTRAVENOUS
  Filled 2021-04-12: qty 200

## 2021-04-12 MED ORDER — MORPHINE SULFATE (PF) 4 MG/ML IV SOLN: 4.0000 mg | INTRAVENOUS | Status: DC | PRN

## 2021-04-12 MED ORDER — METHOCARBAMOL 500 MG PO TABS
500.0000 mg | ORAL_TABLET | Freq: Four times a day (QID) | ORAL | Status: DC | PRN
  Administered 2021-04-13 – 2021-04-14 (×5): 500 mg via ORAL
  Filled 2021-04-12 (×5): qty 1

## 2021-04-12 MED ORDER — METHOCARBAMOL 500 MG IVPB - SIMPLE MED
500.0000 mg | Freq: Four times a day (QID) | INTRAVENOUS | Status: DC | PRN
  Filled 2021-04-12: qty 50

## 2021-04-12 MED ORDER — MIDAZOLAM HCL 2 MG/2ML IJ SOLN
INTRAMUSCULAR | Status: DC | PRN
  Administered 2021-04-12: 2 mg via INTRAVENOUS

## 2021-04-12 MED ORDER — ONDANSETRON HCL 4 MG/2ML IJ SOLN
INTRAMUSCULAR | Status: DC | PRN
  Administered 2021-04-12: 4 mg via INTRAVENOUS

## 2021-04-12 MED ORDER — ACETAMINOPHEN 325 MG PO TABS
325.0000 mg | ORAL_TABLET | Freq: Four times a day (QID) | ORAL | Status: DC | PRN
  Administered 2021-04-13: 650 mg via ORAL
  Filled 2021-04-12: qty 2

## 2021-04-12 MED ORDER — PHENYLEPHRINE HCL (PRESSORS) 10 MG/ML IV SOLN
INTRAVENOUS | Status: AC
  Filled 2021-04-12: qty 2

## 2021-04-12 MED ORDER — 0.9 % SODIUM CHLORIDE (POUR BTL) OPTIME
TOPICAL | Status: DC | PRN
  Administered 2021-04-12: 1000 mL

## 2021-04-12 MED ORDER — PROPOFOL 10 MG/ML IV BOLUS
INTRAVENOUS | Status: DC | PRN
  Administered 2021-04-12: 20 mg via INTRAVENOUS
  Administered 2021-04-12: 30 mg via INTRAVENOUS

## 2021-04-12 MED ORDER — EPHEDRINE SULFATE-NACL 50-0.9 MG/10ML-% IV SOSY
PREFILLED_SYRINGE | INTRAVENOUS | Status: DC | PRN
Start: 2021-04-12 — End: 2021-04-12
  Administered 2021-04-12: 10 mg via INTRAVENOUS

## 2021-04-12 MED ORDER — DEXAMETHASONE SODIUM PHOSPHATE 10 MG/ML IJ SOLN
8.0000 mg | Freq: Once | INTRAMUSCULAR | Status: AC
  Administered 2021-04-12: 8 mg via INTRAVENOUS

## 2021-04-12 MED ORDER — FERROUS GLUCONATE 324 (38 FE) MG PO TABS
324.0000 mg | ORAL_TABLET | Freq: Every day | ORAL | Status: DC
  Administered 2021-04-13 – 2021-04-14 (×2): 324 mg via ORAL
  Filled 2021-04-12 (×2): qty 1

## 2021-04-12 MED ORDER — INSULIN ASPART 100 UNIT/ML IJ SOLN
0.0000 [IU] | Freq: Three times a day (TID) | INTRAMUSCULAR | Status: DC
Start: 2021-04-13 — End: 2021-04-14
  Administered 2021-04-13 (×2): 5 [IU] via SUBCUTANEOUS
  Administered 2021-04-13: 8 [IU] via SUBCUTANEOUS
  Administered 2021-04-14: 3 [IU] via SUBCUTANEOUS

## 2021-04-12 MED ORDER — OXYCODONE HCL 5 MG/5ML PO SOLN: 5.0000 mg | Freq: Once | ORAL | Status: AC | PRN

## 2021-04-12 MED ORDER — BUPROPION HCL ER (SR) 150 MG PO TB12
150.0000 mg | ORAL_TABLET | Freq: Two times a day (BID) | ORAL | Status: DC
  Administered 2021-04-12 – 2021-04-14 (×4): 150 mg via ORAL
  Filled 2021-04-12 (×4): qty 1

## 2021-04-12 MED ORDER — METOCLOPRAMIDE HCL 5 MG/ML IJ SOLN: 5.0000 mg | Freq: Three times a day (TID) | INTRAMUSCULAR | Status: DC | PRN

## 2021-04-12 MED ORDER — PHENYLEPHRINE HCL-NACL 20-0.9 MG/250ML-% IV SOLN
INTRAVENOUS | Status: DC | PRN
  Administered 2021-04-12: 25 ug/min via INTRAVENOUS

## 2021-04-12 MED ORDER — VANCOMYCIN HCL IN DEXTROSE 1-5 GM/200ML-% IV SOLN
1000.0000 mg | INTRAVENOUS | Status: AC
  Administered 2021-04-12: 1000 mg via INTRAVENOUS
  Filled 2021-04-12: qty 200

## 2021-04-12 MED ORDER — DEXAMETHASONE SODIUM PHOSPHATE 10 MG/ML IJ SOLN
10.0000 mg | Freq: Once | INTRAMUSCULAR | Status: AC
  Administered 2021-04-13: 10 mg via INTRAVENOUS
  Filled 2021-04-12: qty 1

## 2021-04-12 MED ORDER — WATER FOR IRRIGATION, STERILE IR SOLN
Status: DC | PRN
  Administered 2021-04-12: 2000 mL

## 2021-04-12 MED ORDER — BUPIVACAINE-EPINEPHRINE (PF) 0.25% -1:200000 IJ SOLN
INTRAMUSCULAR | Status: AC
  Filled 2021-04-12: qty 30

## 2021-04-12 MED ORDER — PROMETHAZINE HCL 25 MG/ML IJ SOLN: 6.2500 mg | INTRAMUSCULAR | Status: DC | PRN

## 2021-04-12 MED ORDER — BUPIVACAINE HCL 0.25 % IJ SOLN
INTRAMUSCULAR | Status: AC
  Filled 2021-04-12: qty 1

## 2021-04-12 MED ORDER — OXYCODONE HCL 5 MG PO TABS: 15.0000 mg | ORAL_TABLET | Freq: Once | ORAL | Status: AC | PRN

## 2021-04-12 MED ORDER — LEVOTHYROXINE SODIUM 50 MCG PO TABS
175.0000 ug | ORAL_TABLET | Freq: Every day | ORAL | Status: DC
  Administered 2021-04-13 – 2021-04-14 (×2): 175 ug via ORAL
  Filled 2021-04-12 (×2): qty 1

## 2021-04-12 MED ORDER — POVIDONE-IODINE 10 % EX SWAB
2.0000 "application " | Freq: Once | CUTANEOUS | Status: AC
  Administered 2021-04-12: 2 via TOPICAL

## 2021-04-12 MED ORDER — ASPIRIN EC 325 MG PO TBEC
325.0000 mg | DELAYED_RELEASE_TABLET | Freq: Two times a day (BID) | ORAL | Status: DC
  Administered 2021-04-13 – 2021-04-14 (×3): 325 mg via ORAL
  Filled 2021-04-12 (×3): qty 1

## 2021-04-12 MED ORDER — FUROSEMIDE 20 MG PO TABS
20.0000 mg | ORAL_TABLET | ORAL | Status: DC
  Administered 2021-04-14: 20 mg via ORAL
  Filled 2021-04-12 (×2): qty 1

## 2021-04-12 MED ORDER — PROPOFOL 500 MG/50ML IV EMUL
INTRAVENOUS | Status: DC | PRN
  Administered 2021-04-12: 75 ug/kg/min via INTRAVENOUS
  Administered 2021-04-12: 50 ug/kg/min via INTRAVENOUS

## 2021-04-12 MED ORDER — MORPHINE SULFATE (PF) 2 MG/ML IV SOLN
0.5000 mg | INTRAVENOUS | Status: DC | PRN
  Administered 2021-04-12 – 2021-04-14 (×6): 1 mg via INTRAVENOUS
  Filled 2021-04-12 (×6): qty 1

## 2021-04-12 MED ORDER — CHLORHEXIDINE GLUCONATE 0.12 % MT SOLN
15.0000 mL | Freq: Once | OROMUCOSAL | Status: AC
  Administered 2021-04-12: 15 mL via OROMUCOSAL

## 2021-04-12 MED ORDER — ONDANSETRON HCL 4 MG/2ML IJ SOLN: 4.0000 mg | Freq: Four times a day (QID) | INTRAMUSCULAR | Status: DC | PRN

## 2021-04-12 MED ORDER — DOCUSATE SODIUM 100 MG PO CAPS
100.0000 mg | ORAL_CAPSULE | Freq: Two times a day (BID) | ORAL | Status: DC
  Administered 2021-04-13 – 2021-04-14 (×2): 100 mg via ORAL
  Filled 2021-04-12 (×3): qty 1

## 2021-04-12 MED ORDER — BISACODYL 10 MG RE SUPP: 10.0000 mg | Freq: Every day | RECTAL | Status: DC | PRN

## 2021-04-12 MED ORDER — SUMATRIPTAN SUCCINATE 50 MG PO TABS
50.0000 mg | ORAL_TABLET | ORAL | Status: DC | PRN
  Filled 2021-04-12: qty 1

## 2021-04-12 MED ORDER — DEXAMETHASONE SODIUM PHOSPHATE 10 MG/ML IJ SOLN
INTRAMUSCULAR | Status: AC
  Filled 2021-04-12: qty 1

## 2021-04-12 MED ORDER — ONDANSETRON HCL 4 MG PO TABS: 4.0000 mg | ORAL_TABLET | Freq: Four times a day (QID) | ORAL | Status: DC | PRN

## 2021-04-12 MED ORDER — ORAL CARE MOUTH RINSE: 15.0000 mL | Freq: Once | OROMUCOSAL | Status: DC

## 2021-04-12 MED ORDER — PHENOL 1.4 % MT LIQD: 1.0000 | OROMUCOSAL | Status: DC | PRN

## 2021-04-12 MED ORDER — PROPOFOL 10 MG/ML IV BOLUS
INTRAVENOUS | Status: AC
  Filled 2021-04-12: qty 20

## 2021-04-12 SURGICAL SUPPLY — 43 items
BAG SPEC THK2 15X12 ZIP CLS (MISCELLANEOUS)
BAG ZIPLOCK 12X15 (MISCELLANEOUS) IMPLANT
BLADE SAG 18X100X1.27 (BLADE) ×2 IMPLANT
COVER PERINEAL POST (MISCELLANEOUS) ×2 IMPLANT
COVER SURGICAL LIGHT HANDLE (MISCELLANEOUS) ×2 IMPLANT
CUP ACET PINNACLE SECTR 50MM (Hips) ×1 IMPLANT
DECANTER SPIKE VIAL GLASS SM (MISCELLANEOUS) ×2 IMPLANT
DRAPE FOOT SWITCH (DRAPES) ×2 IMPLANT
DRAPE STERI IOBAN 125X83 (DRAPES) ×2 IMPLANT
DRAPE U-SHAPE 47X51 STRL (DRAPES) ×4 IMPLANT
DRSG AQUACEL AG ADV 3.5X10 (GAUZE/BANDAGES/DRESSINGS) ×2 IMPLANT
DURAPREP 26ML APPLICATOR (WOUND CARE) ×2 IMPLANT
ELECT REM PT RETURN 15FT ADLT (MISCELLANEOUS) ×2 IMPLANT
GLOVE SRG 8 PF TXTR STRL LF DI (GLOVE) ×1 IMPLANT
GLOVE SURG ENC MOIS LTX SZ6.5 (GLOVE) ×2 IMPLANT
GLOVE SURG ENC MOIS LTX SZ7 (GLOVE) ×2 IMPLANT
GLOVE SURG ENC MOIS LTX SZ8 (GLOVE) ×4 IMPLANT
GLOVE SURG UNDER POLY LF SZ7 (GLOVE) ×2 IMPLANT
GLOVE SURG UNDER POLY LF SZ8 (GLOVE) ×2
GLOVE SURG UNDER POLY LF SZ8.5 (GLOVE) IMPLANT
GOWN STRL REUS W/TWL LRG LVL3 (GOWN DISPOSABLE) ×4 IMPLANT
GOWN STRL REUS W/TWL XL LVL3 (GOWN DISPOSABLE) IMPLANT
HEAD FEMORAL 32 CERAMIC (Hips) ×2 IMPLANT
HOLDER FOLEY CATH W/STRAP (MISCELLANEOUS) ×2 IMPLANT
KIT TURNOVER KIT A (KITS) IMPLANT
LINER MARATHON 32 50 (Hips) ×2 IMPLANT
MANIFOLD NEPTUNE II (INSTRUMENTS) ×2 IMPLANT
PACK ANTERIOR HIP CUSTOM (KITS) ×2 IMPLANT
PENCIL SMOKE EVACUATOR COATED (MISCELLANEOUS) ×2 IMPLANT
PINNACLE SECTOR CUP 50MM (Hips) ×2 IMPLANT
SCREW 6.5MMX25MM (Screw) ×2 IMPLANT
STEM FEMORAL SZ5 HIGH ACTIS (Stem) ×2 IMPLANT
STRIP CLOSURE SKIN 1/2X4 (GAUZE/BANDAGES/DRESSINGS) ×2 IMPLANT
SUT ETHIBOND NAB CT1 #1 30IN (SUTURE) ×2 IMPLANT
SUT MNCRL AB 4-0 PS2 18 (SUTURE) ×2 IMPLANT
SUT STRATAFIX 0 PDS 27 VIOLET (SUTURE) ×2
SUT VIC AB 2-0 CT1 27 (SUTURE) ×4
SUT VIC AB 2-0 CT1 TAPERPNT 27 (SUTURE) ×2 IMPLANT
SUTURE STRATFX 0 PDS 27 VIOLET (SUTURE) ×1 IMPLANT
SYR 50ML LL SCALE MARK (SYRINGE) IMPLANT
TAPE STRIPS DRAPE STRL (GAUZE/BANDAGES/DRESSINGS) ×2 IMPLANT
TRAY FOLEY MTR SLVR 16FR STAT (SET/KITS/TRAYS/PACK) ×2 IMPLANT
TUBE SUCTION HIGH CAP CLEAR NV (SUCTIONS) ×2 IMPLANT

## 2021-04-12 NOTE — Interval H&P Note (Signed)
History and Physical Interval Note:  04/12/2021 12:42 PM  [name]  has presented today for surgery, with the diagnosis of right hip osteoarthritis.  The various methods of treatment have been discussed with the patient and family. After consideration of risks, benefits and other options for treatment, the patient has consented to  Procedure(s): TOTAL HIP ARTHROPLASTY ANTERIOR APPROACH (Right) as a surgical intervention.  The patient's history has been reviewed, patient examined, no change in status, stable for surgery.  I have reviewed the patient's chart and labs.  Questions were answered to the patient's satisfaction.     Homero Fellers [place]

## 2021-04-12 NOTE — Op Note (Signed)
OPERATIVE REPORT- TOTAL HIP ARTHROPLASTY   PREOPERATIVE DIAGNOSIS: Osteoarthritis of the Right hip.   POSTOPERATIVE DIAGNOSIS: Osteoarthritis of the Right  hip.   PROCEDURE: Right total hip arthroplasty, anterior approach.   SURGEON: Ollen Gross, MD   ASSISTANT: Arther Abbott, PA-C  ANESTHESIA:  Spinal  ESTIMATED BLOOD LOSS:-400 mL    DRAINS: Hemovac x1.   COMPLICATIONS: None   CONDITION: PACU - hemodynamically stable.   BRIEF CLINICAL NOTE: [name] is a 65 y.o. female who has advanced end-  stage arthritis of their Right  hip with progressively worsening pain and  dysfunction.The patient has failed nonoperative management and presents for  total hip arthroplasty.   PROCEDURE IN DETAIL: After successful administration of spinal  anesthetic, the traction boots for the [facility] bed were placed on both  feet and the patient was placed onto the [facility] bed, boots placed into the leg  holders. The Right hip was then isolated from the perineum with plastic  drapes and prepped and draped in the usual sterile fashion. ASIS and  greater trochanter were marked and a oblique incision was made, starting  at about 1 cm lateral and 2 cm distal to the ASIS and coursing towards  the anterior cortex of the femur. The skin was cut with a 10 blade  through subcutaneous tissue to the level of the fascia overlying the  tensor fascia lata muscle. The fascia was then incised in line with the  incision at the junction of the anterior third and posterior 2/3rd. The  muscle was teased off the fascia and then the interval between the TFL  and the rectus was developed. The Hohmann retractor was then placed at  the top of the femoral neck over the capsule. The vessels overlying the  capsule were cauterized and the fat on top of the capsule was removed.  A Hohmann retractor was then placed anterior underneath the rectus  femoris to give exposure to the entire anterior capsule. A T-shaped   capsulotomy was performed. The edges were tagged and the femoral head  was identified.       Osteophytes are removed off the superior acetabulum.  The femoral neck was then cut in situ with an oscillating saw. Traction  was then applied to the left lower extremity utilizing the [facility]  traction. The femoral head was then removed. Retractors were placed  around the acetabulum and then circumferential removal of the labrum was  performed. Osteophytes were also removed. Reaming starts at 47 mm to  medialize and  Increased in 2 mm increments to 49 mm. We reamed in  approximately 40 degrees of abduction, 20 degrees anteversion. A 50 mm  pinnacle acetabular shell was then impacted in anatomic position under  fluoroscopic guidance with excellent purchase. I place an additional dome screw.  A 32 mm neutral + 4 marathon liner was then  placed into the acetabular shell.       The femoral lift was then placed along the lateral aspect of the femur  just distal to the vastus ridge. The leg was  externally rotated and capsule  was stripped off the inferior aspect of the femoral neck down to the  level of the lesser trochanter, this was done with electrocautery. The femur was lifted after this was performed. The  leg was then placed in an extended and adducted position essentially delivering the femur. We also removed the capsule superiorly and the piriformis from the piriformis fossa to gain excellent  exposure of the  proximal femur. Rongeur was used to remove some cancellous bone to get  into the lateral portion of the proximal femur for placement of the  initial starter reamer. The starter broaches was placed  the starter broach  and was shown to go down the center of the canal. Broaching  with the Actis system was then performed starting at size 0  coursing  Up to size 5. A size 5 had excellent torsional and rotational  and axial stability. The trial high offset neck was then placed  with a 32 + 1  trial head. The hip was then reduced. We confirmed that  the stem was in the canal both on AP and lateral x-rays. It also has excellent sizing. The hip was reduced with outstanding stability through full extension and full external rotation.. AP pelvis was taken and the leg lengths were measured and found to be equal. Hip was then dislocated again and the femoral head and neck removed. The  femoral broach was removed. Size 5 Actis stem with a high offset  neck was then impacted into the femur following native anteversion. Has  excellent purchase in the canal. Excellent torsional and rotational and  axial stability. It is confirmed to be in the canal on AP and lateral  fluoroscopic views. The 32 + 1 ceramic head was placed and the hip  reduced with outstanding stability. Again AP pelvis was taken and it  confirmed that the leg lengths were equal. The wound was then copiously  irrigated with saline solution and the capsule reattached and repaired  with Ethibond suture. 30 ml of .25% Bupivicaine was  injected into the capsule and into the edge of the tensor fascia lata as well as subcutaneous tissue. The fascia overlying the tensor fascia lata was then closed with a running #1 V-Loc. Subcu was closed with interrupted 2-0 Vicryl and subcuticular running 4-0 Monocryl. Incision was cleaned  and dried. Steri-Strips and a bulky sterile dressing applied. The patient was awakened and transported to  recovery in stable condition.        Please note that a surgical assistant was a medical necessity for this procedure to perform it in a safe and expeditious manner. Assistant was necessary to provide appropriate retraction of vital neurovascular structures and to prevent femoral fracture and allow for anatomic placement of the prosthesis.  Ollen Gross, M.D.

## 2021-04-12 NOTE — Anesthesia Procedure Notes (Signed)
Spinal  Patient location during procedure: OR Start time: 04/12/2021 2:42 PM End time: 04/12/2021 2:51 PM Reason for block: surgical anesthesia Staffing Performed: anesthesiologist  Anesthesiologist: Lannie Fields, DO Preanesthetic Checklist Completed: patient identified, IV checked, risks and benefits discussed, surgical consent, monitors and equipment checked, pre-op evaluation and timeout performed Spinal Block Patient position: sitting Prep: DuraPrep and site prepped and draped Patient monitoring: cardiac monitor, continuous pulse ox and blood pressure Approach: midline Location: L3-4 Injection technique: single-shot Needle Needle type: Pencan  Needle gauge: 24 G Needle length: 9 cm Assessment Sensory level: T6 Events: CSF return and second provider Additional Notes Functioning IV was confirmed and monitors were applied. Sterile prep and drape, including hand hygiene and sterile gloves were used. The patient was positioned and the spine was prepped. The skin was anesthetized with lidocaine.  Free flow of clear CSF was obtained prior to injecting local anesthetic into the CSF.  The spinal needle aspirated freely following injection.  The needle was carefully withdrawn.  The patient tolerated the procedure well.

## 2021-04-12 NOTE — Progress Notes (Signed)
Spoke to PA about pt's pain regime. Asked for Tylenol. Also asked for pt's home meds of insulin.

## 2021-04-12 NOTE — Transfer of Care (Signed)
Immediate Anesthesia Transfer of Care Note  Patient: [name]  Procedure(s) Performed: TOTAL HIP ARTHROPLASTY ANTERIOR APPROACH (Right: Hip)  Patient Location: PACU  Anesthesia Type:Spinal  Level of Consciousness: awake  Airway & Oxygen Therapy: Patient Spontanous Breathing and Patient connected to face mask oxygen  Post-op Assessment: Report given to RN and Post -op Vital signs reviewed and stable  Post vital signs: Reviewed and stable  Last Vitals:  Vitals Value Taken Time  BP 87/55 04/12/21 1630  Temp    Pulse 65 04/12/21 1631  Resp 9 04/12/21 1631  SpO2 100 % 04/12/21 1631  Vitals shown include unvalidated device data.  Last Pain:  Vitals:   04/12/21 1247  TempSrc:   PainSc: 6       Patients Stated Pain Goal: 3 (04/12/21 1247)  Complications: No notable events documented.

## 2021-04-12 NOTE — Anesthesia Postprocedure Evaluation (Signed)
Anesthesia Post Note  Patient: [name]  Procedure(s) Performed: TOTAL HIP ARTHROPLASTY ANTERIOR APPROACH (Right: Hip)     Patient location during evaluation: PACU Anesthesia Type: Spinal and MAC Level of consciousness: awake and alert and oriented Pain management: pain level controlled Vital Signs Assessment: post-procedure vital signs reviewed and stable Respiratory status: spontaneous breathing, nonlabored ventilation and respiratory function stable Cardiovascular status: blood pressure returned to baseline and stable Postop Assessment: no headache, no backache, spinal receding and no apparent nausea or vomiting Anesthetic complications: no   No notable events documented.  Last Vitals:  Vitals:   04/12/21 1715 04/12/21 1730  BP: 96/73 104/89  Pulse: 66 69  Resp: 12 13  Temp:    SpO2: 97% 97%    Last Pain:  Vitals:   04/12/21 1730  TempSrc:   PainSc: 0-No pain                 Pervis Hocking

## 2021-04-12 NOTE — Discharge Instructions (Signed)
°Frank Aluisio, MD °Total Joint Specialist °[facility] °[address] °[place], [place] [address] °[phone] ° °ANTERIOR APPROACH TOTAL HIP REPLACEMENT POSTOPERATIVE DIRECTIONS ° ° ° ° °Hip Rehabilitation, Guidelines Following Surgery  °The results of a hip operation are greatly improved after range of motion and muscle strengthening exercises. Follow all safety measures which are given to protect your hip. If any of these exercises cause increased pain or swelling in your joint, decrease the amount until you are comfortable again. Then slowly increase the exercises. Call your caregiver if you have problems or questions.  ° °BLOOD CLOT PREVENTION °Take a 325 mg Aspirin two times a day for three weeks following surgery. Then take an 81 mg Aspirin once a day for three weeks. Then discontinue Aspirin. °You may resume your vitamins/supplements upon discharge from the hospital. °Do not take any NSAIDs (Advil, Aleve, Ibuprofen, Meloxicam, etc.) until you have discontinued the 325 mg Aspirin. ° °HOME CARE INSTRUCTIONS  °Remove items at home which could result in a fall. This includes throw rugs or furniture in walking pathways.  °ICE to the affected hip as frequently as 20-30 minutes an hour and then as needed for pain and swelling. Continue to use ice on the hip for pain and swelling from surgery. You may notice swelling that will progress down to the foot and ankle. This is normal after surgery. Elevate the leg when you are not up walking on it.   °Continue to use the breathing machine which will help keep your temperature down.  It is common for your temperature to cycle up and down following surgery, especially at night when you are not up moving around and exerting yourself.  The breathing machine keeps your lungs expanded and your temperature down. ° °DIET °You may resume your previous home diet once your are discharged from the hospital. ° °DRESSING / WOUND CARE / SHOWERING °You have  an adhesive waterproof bandage over the incision. Leave this in place until your first follow-up appointment. Once you remove this you will not need to place another bandage.  °You may begin showering 3 days following surgery, but do not submerge the incision under water. ° °ACTIVITY °For the first 3-5 days, it is important to rest and keep the operative leg elevated. You should, as a general rule, rest for 50 minutes and walk/stretch for 10 minutes per hour. After 5 days, you may slowly increase activity as tolerated.  °Perform the exercises you were provided twice a day for about 15-20 minutes each session. Begin these 2 days following surgery. °Walk with your walker as instructed. Use the walker until you are comfortable transitioning to a cane. Walk with the cane in the opposite hand of the operative leg. You may discontinue the cane once you are comfortable and walking steadily. °Avoid periods of inactivity such as sitting longer than an hour when not asleep. This helps prevent blood clots.  °Do not drive a car for 6 weeks or until released by your surgeon.  °Do not drive while taking narcotics. ° °TED HOSE STOCKINGS °Wear the elastic stockings on both legs for three weeks following surgery during the day. You may remove them at night while sleeping. ° °WEIGHT BEARING °Weight bearing as tolerated with assist device (walker, cane, etc) as directed, use it as long as suggested by your surgeon or therapist, typically at least 4-6 weeks. ° °POSTOPERATIVE CONSTIPATION PROTOCOL °Constipation - defined medically as fewer than three stools per week and severe constipation as   less than one stool per week. ° °One of the most common issues patients have following surgery is constipation.  Even if you have a regular bowel pattern at home, your normal regimen is likely to be disrupted due to multiple reasons following surgery.  Combination of anesthesia, postoperative narcotics, change in appetite and fluid intake all can  affect your bowels.  In order to avoid complications following surgery, here are some recommendations in order to help you during your recovery period. ° °Colace (docusate) - Pick up an over-the-counter form of Colace or another stool softener and take twice a day as long as you are requiring postoperative pain medications.  Take with a full glass of water daily.  If you experience loose stools or diarrhea, hold the colace until you stool forms back up.  If your symptoms do not get better within 1 week or if they get worse, check with your doctor. °Dulcolax (bisacodyl) - Pick up over-the-counter and take as directed by the product packaging as needed to assist with the movement of your bowels.  Take with a full glass of water.  Use this product as needed if not relieved by Colace only.  °MiraLax (polyethylene glycol) - Pick up over-the-counter to have on hand.  MiraLax is a solution that will increase the amount of water in your bowels to assist with bowel movements.  Take as directed and can mix with a glass of water, juice, soda, coffee, or tea.  Take if you go more than two days without a movement.Do not use MiraLax more than once per day. Call your doctor if you are still constipated or irregular after using this medication for 7 days in a row. ° °If you continue to have problems with postoperative constipation, please contact the office for further assistance and recommendations.  If you experience "the worst abdominal pain ever" or develop nausea or vomiting, please contact the office immediatly for further recommendations for treatment. ° °ITCHING ° If you experience itching with your medications, try taking only a single pain pill, or even half a pain pill at a time.  You can also use Benadryl over the counter for itching or also to help with sleep.  ° °MEDICATIONS °See your medication summary on the “After Visit Summary” that the nursing staff will review with you prior to discharge.  You may have some home  medications which will be placed on hold until you complete the course of blood thinner medication.  It is important for you to complete the blood thinner medication as prescribed by your surgeon.  Continue your approved medications as instructed at time of discharge. ° °PRECAUTIONS °If you experience chest pain or shortness of breath - call 911 immediately for transfer to the hospital emergency department.  °If you develop a fever greater that 101 F, purulent drainage from wound, increased redness or drainage from wound, foul odor from the wound/dressing, or calf pain - CONTACT YOUR SURGEON.   °                                                °FOLLOW-UP APPOINTMENTS °Make sure you keep all of your appointments after your operation with your surgeon and caregivers. You should call the office at the above phone number and make an appointment for approximately two weeks after the date of your surgery or on the   date instructed by your surgeon outlined in the "After Visit Summary". ° °RANGE OF MOTION AND STRENGTHENING EXERCISES  °These exercises are designed to help you keep full movement of your hip joint. Follow your caregiver's or physical therapist's instructions. Perform all exercises about fifteen times, three times per day or as directed. Exercise both hips, even if you have had only one joint replacement. These exercises can be done on a training (exercise) mat, on the floor, on a table or on a bed. Use whatever works the best and is most comfortable for you. Use music or television while you are exercising so that the exercises are a pleasant break in your day. This will make your life better with the exercises acting as a break in routine you can look forward to.  °Lying on your back, slowly slide your foot toward your buttocks, raising your knee up off the floor. Then slowly slide your foot back down until your leg is straight again.  °Lying on your back spread your legs as far apart as you can without causing  discomfort.  °Lying on your side, raise your upper leg and foot straight up from the floor as far as is comfortable. Slowly lower the leg and repeat.  °Lying on your back, tighten up the muscle in the front of your thigh (quadriceps muscles). You can do this by keeping your leg straight and trying to raise your heel off the floor. This helps strengthen the largest muscle supporting your knee.  °Lying on your back, tighten up the muscles of your buttocks both with the legs straight and with the knee bent at a comfortable angle while keeping your heel on the floor.  ° °POST-OPERATIVE OPIOID TAPER INSTRUCTIONS: °It is important to wean off of your opioid medication as soon as possible. If you do not need pain medication after your surgery it is ok to stop day one. °Opioids include: °Codeine, Hydrocodone(Norco, Vicodin), Oxycodone(Percocet, oxycontin) and hydromorphone amongst others.  °Long term and even short term use of opiods can cause: °Increased pain response °Dependence °Constipation °Depression °Respiratory depression °And more.  °Withdrawal symptoms can include °Flu like symptoms °Nausea, vomiting °And more °Techniques to manage these symptoms °Hydrate well °Eat regular healthy meals °Stay active °Use relaxation techniques(deep breathing, meditating, yoga) °Do Not substitute Alcohol to help with tapering °If you have been on opioids for less than two weeks and do not have pain than it is ok to stop all together.  °Plan to wean off of opioids °This plan should start within one week post op of your joint replacement. °Maintain the same interval or time between taking each dose and first decrease the dose.  °Cut the total daily intake of opioids by one tablet each day °Next start to increase the time between doses. °The last dose that should be eliminated is the evening dose.  ° °IF YOU ARE TRANSFERRED TO A SKILLED REHAB FACILITY °If the patient is transferred to a skilled rehab facility following release from the  hospital, a list of the current medications will be sent to the facility for the patient to continue.  When discharged from the skilled rehab facility, please have the facility set up the patient's Home Health Physical Therapy prior to being released. Also, the skilled facility will be responsible for providing the patient with their medications at time of release from the facility to include their pain medication, the muscle relaxants, and their blood thinner medication. If the patient is still at the rehab facility   at time of the two week follow up appointment, the skilled rehab facility will also need to assist the patient in arranging follow up appointment in our office and any transportation needs. ° °MAKE SURE YOU:  °Understand these instructions.  °Get help right away if you are not doing well or get worse.  ° ° °DENTAL ANTIBIOTICS: ° °In most cases prophylactic antibiotics for Dental procdeures after total joint surgery are not necessary. ° °Exceptions are as follows: ° °1. History of prior total joint infection ° °2. Severely immunocompromised (Organ Transplant, cancer chemotherapy, Rheumatoid biologic °meds such as Humera) ° °3. Poorly controlled diabetes (A1C &gt; 8.0, blood glucose over 200) ° °If you have one of these conditions, contact your surgeon for an antibiotic prescription, prior to your °dental procedure.  ° ° °Pick up stool softner and laxative for home use following surgery while on pain medications. °Do not submerge incision under water. °Please use good hand washing techniques while changing dressing each day. °May shower starting three days after surgery. °Please use a clean towel to pat the incision dry following showers. °Continue to use ice for pain and swelling after surgery. °Do not use any lotions or creams on the incision until instructed by your surgeon. ° °

## 2021-04-12 NOTE — Plan of Care (Signed)
  Problem: Activity: Goal: Ability to avoid complications of mobility impairment will improve Outcome: Progressing Goal: Ability to tolerate increased activity will improve Outcome: Progressing   Problem: Pain Management: Goal: Pain level will decrease with appropriate interventions Outcome: Progressing   

## 2021-04-12 NOTE — Anesthesia Procedure Notes (Signed)
Procedure Name: MAC Date/Time: 04/12/2021 2:41 PM Performed by: Niel Hummer, CRNA Pre-anesthesia Checklist: Patient identified and Emergency Drugs available Oxygen Delivery Method: Simple face mask

## 2021-04-13 ENCOUNTER — Encounter (HOSPITAL_COMMUNITY): Payer: Self-pay | Admitting: Orthopedic Surgery

## 2021-04-13 DIAGNOSIS — M1611 Unilateral primary osteoarthritis, right hip: Secondary | ICD-10-CM | POA: Diagnosis not present

## 2021-04-13 LAB — BASIC METABOLIC PANEL
Anion gap: 11 (ref 5–15)
BUN: 15 mg/dL (ref 8–23)
CO2: 27 mmol/L (ref 22–32)
Calcium: 9 mg/dL (ref 8.9–10.3)
Chloride: 97 mmol/L — ABNORMAL LOW (ref 98–111)
Creatinine, Ser: 0.92 mg/dL (ref 0.44–1.00)
GFR, Estimated: >60 mL/min (ref >60)
Glucose, Bld: 242 mg/dL — ABNORMAL HIGH (ref 70–99)
Potassium: 3.7 mmol/L (ref 3.5–5.1)
Sodium: 135 mmol/L (ref 135–145)

## 2021-04-13 LAB — GLUCOSE, CAPILLARY
Glucose-Capillary: 218 mg/dL — ABNORMAL HIGH (ref 70–99)
Glucose-Capillary: 201 mg/dL — ABNORMAL HIGH (ref 70–99)
Glucose-Capillary: 278 mg/dL — ABNORMAL HIGH (ref 70–99)
Glucose-Capillary: 269 mg/dL — ABNORMAL HIGH (ref 70–99)

## 2021-04-13 LAB — CBC
HCT: 39.8 % (ref 36.0–46.0)
Hemoglobin: 12.7 g/dL (ref 12.0–15.0)
MCH: 28.6 pg (ref 26.0–34.0)
MCHC: 31.9 g/dL (ref 30.0–36.0)
MCV: 89.6 fL (ref 80.0–100.0)
Platelets: 215 10*3/uL (ref 150–400)
RBC: 4.44 MIL/uL (ref 3.87–5.11)
RDW: 13.4 % (ref 11.5–15.5)
WBC: 14.1 10*3/uL — ABNORMAL HIGH (ref 4.0–10.5)
nRBC: 0 % (ref 0.0–0.2)

## 2021-04-13 MED ORDER — ASPIRIN 325 MG PO TBEC: 325.0000 mg | DELAYED_RELEASE_TABLET | Freq: Two times a day (BID) | ORAL | 0 refills | Status: AC

## 2021-04-13 MED ORDER — CHLORHEXIDINE GLUCONATE CLOTH 2 % EX PADS: 6.0000 | MEDICATED_PAD | Freq: Every day | CUTANEOUS | Status: DC

## 2021-04-13 MED ORDER — MUPIROCIN 2 % EX OINT
1.0000 "application " | TOPICAL_OINTMENT | Freq: Two times a day (BID) | CUTANEOUS | Status: DC
  Administered 2021-04-13 – 2021-04-14 (×3): 1 via NASAL
  Filled 2021-04-13: qty 22

## 2021-04-13 MED ORDER — CHLORHEXIDINE GLUCONATE CLOTH 2 % EX PADS
6.0000 | MEDICATED_PAD | Freq: Every day | CUTANEOUS | Status: DC
  Administered 2021-04-13 – 2021-04-14 (×2): 6 via TOPICAL

## 2021-04-13 MED ORDER — OXYCODONE HCL 5 MG PO TABS: 5.0000 mg | ORAL_TABLET | Freq: Four times a day (QID) | ORAL | 0 refills | Status: DC | PRN

## 2021-04-13 MED ORDER — METHOCARBAMOL 500 MG PO TABS: 500.0000 mg | ORAL_TABLET | Freq: Four times a day (QID) | ORAL | 0 refills | Status: DC | PRN

## 2021-04-13 NOTE — Plan of Care (Signed)
  Problem: Education: Goal: Knowledge of General Education information will improve Description Including pain rating scale, medication(s)/side effects and non-pharmacologic comfort measures Outcome: Progressing   

## 2021-04-13 NOTE — Evaluation (Signed)
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  PMHx significant for but not limited to OA, DM, chronic pain  Clinical Impression  Pt is s/p THA resulting in the deficits listed below (see PT Problem List).  Pt will benefit from skilled PT to increase their independence and safety with mobility to allow discharge to the venue listed below.  Pt reports she has not yet been OOB and having increased pain with mobility this morning.  Pt only able to tolerate standing and few steps over to recliner.  Pt anticipates d/c home and does have some steps to negotiate to enter home.       Recommendations for follow up therapy are one component of a multi-disciplinary discharge planning process, led by the attending physician.  Recommendations may be updated based on patient status, additional functional criteria and insurance authorization.  Follow Up Recommendations Follow physician's recommendations for discharge plan and follow up therapies    Assistance Recommended at Discharge    Functional Status Assessment Patient has had a recent decline in their functional status and demonstrates the ability to make significant improvements in function in a reasonable and predictable amount of time.  Equipment Recommendations  None recommended by PT    Recommendations for Other Services       Precautions / Restrictions Precautions Precautions: Fall Restrictions Weight Bearing Restrictions: Yes RLE Weight Bearing: Weight bearing as tolerated      Mobility  Bed Mobility Overal bed mobility: Needs Assistance Bed Mobility: Supine to Sit     Supine to sit: Min assist     General bed mobility comments: assist for R LE due to pain    Transfers Overall transfer level: Needs assistance Equipment used: Rolling walker (2  wheels) Transfers: Sit to/from Stand;Stand Pivot Transfers Sit to Stand: Min assist Stand pivot transfers: Min assist         General transfer comment: verbal cues for UE and LE positioning for pain control, required assist to rise, steady and control descent, not able to tolerate a few steps due to increased burning pain and requested recliner    Ambulation/Gait                Stairs            Wheelchair Mobility    Modified Rankin (Stroke Patients Only)       Balance                                             Pertinent Vitals/Pain Pain Assessment: 0-10 Pain Score: 8  Pain Location: right hip Pain Descriptors / Indicators: Burning Pain Intervention(s): Repositioned;Monitored during session;Premedicated before session;Ice applied    Home Living Family/patient expects to be discharged to:: Private residence Living Arrangements: Spouse/significant other   Type of Home: House Home Access: Stairs to enter   Secretary/administrator of Steps: 2+1   Home Layout: One level Home Equipment: Agricultural consultant (2 wheels)      Prior Function Prior Level of Function : Independent/Modified Independent                     Hand Dominance        Extremity/Trunk Assessment  Lower Extremity Assessment Lower Extremity Assessment: RLE deficits/detail RLE Deficits / Details: anticipated post op hip weakness, grossly 2+/5 hip strength per observation, able to perform ankle pumps       Communication   Communication: No difficulties  Cognition Arousal/Alertness: Awake/alert Behavior During Therapy: WFL for tasks assessed/performed Overall Cognitive Status: Within Functional Limits for tasks assessed                                          General Comments      Exercises     Assessment/Plan    PT Assessment Patient needs continued PT services  PT Problem List Decreased strength;Decreased activity  tolerance;Decreased mobility;Decreased knowledge of precautions;Decreased range of motion;Pain;Decreased knowledge of use of DME       PT Treatment Interventions Gait training;DME instruction;Therapeutic exercise;Balance training;Stair training;Functional mobility training;Therapeutic activities;Patient/family education    PT Goals (Current goals can be found in the Care Plan section)  Acute Rehab PT Goals PT Goal Formulation: With patient Time For Goal Achievement: 04/20/21 Potential to Achieve Goals: Good    Frequency 7X/week   Barriers to discharge        Co-evaluation               AM-PAC PT "6 Clicks" Mobility  Outcome Measure Help needed turning from your back to your side while in a flat bed without using bedrails?: A Little Help needed moving from lying on your back to sitting on the side of a flat bed without using bedrails?: A Little Help needed moving to and from a bed to a chair (including a wheelchair)?: A Little Help needed standing up from a chair using your arms (e.g., wheelchair or bedside chair)?: A Little Help needed to walk in hospital room?: A Lot Help needed climbing 3-5 steps with a railing? : A Lot 6 Click Score: 16    End of Session Equipment Utilized During Treatment: Gait belt Activity Tolerance: Patient limited by pain Patient left: in chair;with call bell/phone within reach;with chair alarm set Nurse Communication: Mobility status PT Visit Diagnosis: Other abnormalities of gait and mobility (R26.89)    Time: 2585-2778 PT Time Calculation (min) (ACUTE ONLY): 15 min   Charges:   PT Evaluation $PT Eval Low Complexity: 1 Low        Kati PT, DPT Acute Rehabilitation Services Pager: [phone] Office: [phone]   Janan Halter [place] 04/13/2021, 12:28 PM

## 2021-04-13 NOTE — Progress Notes (Addendum)
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  PMHx significant for but not limited to OA, DM, chronic pain    PT Comments    Pt reports tolerable pain at rest however increased pain with mobility.  Pt only able to tolerate ambulating within room this afternoon and not able to attempt steps.  Pt not yet ready for d/c.    Recommendations for follow up therapy are one component of a multi-disciplinary discharge planning process, led by the attending physician.  Recommendations may be updated based on patient status, additional functional criteria and insurance authorization.  Follow Up Recommendations  Follow physician's recommendations for discharge plan and follow up therapies     Assistance Recommended at Discharge    Equipment Recommendations  None recommended by PT    Recommendations for Other Services       Precautions / Restrictions Precautions Precautions: Fall Restrictions RLE Weight Bearing: Weight bearing as tolerated     Mobility  Bed Mobility Overal bed mobility: Needs Assistance Bed Mobility: Sit to Supine       Sit to supine: Min assist   General bed mobility comments: pt attempted to self assist however unable to lift R LE due to pain, required assist for R LE    Transfers Overall transfer level: Needs assistance Equipment used: Rolling walker (2 wheels) Transfers: Sit to/from Stand Sit to Stand: Min assist           General transfer comment: verbal cues for UE and LE positioning for pain control, required assist to control descent    Ambulation/Gait Ambulation/Gait assistance: Min assist;Min guard Gait Distance (Feet): 18 Feet Assistive device: Rolling walker (2 wheels) Gait Pattern/deviations: Step-to pattern;Decreased stance time - right;Antalgic     General  Gait Details: verbal cues for sequence, RW positioning, step length; pt not able to advance surgical leg first so performed nonsurgical leg first with small steps, pt very limited by pain   Stairs             Wheelchair Mobility    Modified Rankin (Stroke Patients Only)       Balance                                            Cognition Arousal/Alertness: Awake/alert Behavior During Therapy: WFL for tasks assessed/performed Overall Cognitive Status: Within Functional Limits for tasks assessed                                          Exercises      General Comments        Pertinent Vitals/Pain Pain Assessment: 0-10 Pain Score: 7  Pain Location: right hip Pain Descriptors / Indicators: Burning;Sore;Guarding;Grimacing Pain Intervention(s): Repositioned;Monitored during session;Patient requesting pain meds-RN notified (RN checking on pain meds)    Home Living                          Prior Function            PT Goals (current goals can now be found in the care plan section) Progress towards  PT goals: Progressing toward goals    Frequency    7X/week      PT Plan Current plan remains appropriate    Co-evaluation              AM-PAC PT "6 Clicks" Mobility   Outcome Measure  Help needed turning from your back to your side while in a flat bed without using bedrails?: A Little Help needed moving from lying on your back to sitting on the side of a flat bed without using bedrails?: A Little Help needed moving to and from a bed to a chair (including a wheelchair)?: A Little Help needed standing up from a chair using your arms (e.g., wheelchair or bedside chair)?: A Little Help needed to walk in hospital room?: A Lot Help needed climbing 3-5 steps with a railing? : A Lot 6 Click Score: 16    End of Session Equipment Utilized During Treatment: Gait belt Activity Tolerance: Patient limited by  pain Patient left: with call bell/phone within reach;in bed Nurse Communication: Mobility status;Patient requests pain meds PT Visit Diagnosis: Other abnormalities of gait and mobility (R26.89)     Time: 8676-7209 PT Time Calculation (min) (ACUTE ONLY): 17 min  Thomasene Mohair PT, DPT Acute Rehabilitation Services Pager: [phone] Office: [phone]    [name] 04/13/2021, 4:27 PM

## 2021-04-13 NOTE — TOC Transition Note (Signed)
Transition of Care [facility]) - CM/SW Discharge Note  Patient Details  Name: DANIJA GOSA MRN: [id number] Date of Birth: 04-18-1956  Transition of Care [facility]) CM/SW Contact:  Sherie Don, LCSW Phone Number: 04/13/2021, 9:45 AM  Clinical Narrative: Patient is expected to discharge after working with PT. CSW met with patient to confirm discharge plan and needs. Patient will discharge home with a home exercise program (HEP). Patient has a rolling walker at home and asked about a 3N1. [employer] spoke with patient regarding changes to how Medicare is billing/covering 3N1s, so patient's brother will assist with getting a 3N1 for the patient. TOC signing off.  Final next level of care: OP Rehab Barriers to Discharge: No Barriers Identified  Patient Goals and CMS Choice Patient states their goals for this hospitalization and ongoing recovery are:: Discharge home with HEP CMS Medicare.gov Compare Post Acute Care list provided to:: Patient Choice offered to / list presented to : Patient  Discharge Plan and Services         DME Arranged: N/A DME Agency: NA  Readmission Risk Interventions No flowsheet data found.

## 2021-04-13 NOTE — Plan of Care (Signed)
  Problem: Pain Management: Goal: Pain level will decrease with appropriate interventions 04/13/2021 0747 by Penelope Galas, RN Outcome: Progressing 04/12/2021 1855 by Penelope Galas, RN Outcome: Progressing   Problem: Skin Integrity: Goal: Will show signs of wound healing 04/13/2021 0747 by Penelope Galas, RN Outcome: Progressing 04/12/2021 1855 by Penelope Galas, RN Outcome: Progressing

## 2021-04-13 NOTE — Progress Notes (Signed)
   Subjective: 1 Day Post-Op Procedure(s) (LRB): TOTAL HIP ARTHROPLASTY ANTERIOR APPROACH (Right) Patient reports pain as mild.   Patient seen in rounds by Dr. Lequita Halt. Patient has complaints of muscle spasms, has robaxin ordered. Denies chest pain or SOB. No issues overnight. Foley catheter removed this AM. We will begin therapy today.   Objective: Vital signs in last 24 hours: Temp:  [97.4 F (36.3 C)-98.9 F (37.2 C)] 98.9 F (37.2 C) (11/01 0529) Pulse Rate:  [65-85] 85 (11/01 0529) Resp:  [11-21] 16 (11/01 0529) BP: (87-130)/(52-89) 128/73 (11/01 0529) SpO2:  [93 %-100 %] 94 % (11/01 0529) Weight:  [62.7 kg] 62.7 kg (10/31 1247)  Intake/Output from previous day:  Intake/Output Summary (Last 24 hours) at 04/13/2021 0758 Last data filed at 04/13/2021 0658 Gross per 24 hour  Intake 2124.83 ml  Output 4550 ml  Net -2425.17 ml     Intake/Output this shift: No intake/output data recorded.  Labs: Recent Labs    04/13/21 0327  HGB 12.7   Recent Labs    04/13/21 0327  WBC 14.1*  RBC 4.44  HCT 39.8  PLT 215   Recent Labs    04/13/21 0327  NA 135  K 3.7  CL 97*  CO2 27  BUN 15  CREATININE 0.92  GLUCOSE 242*  CALCIUM 9.0    Exam: General - Patient is Alert and Oriented Extremity - Neurologically intact Neurovascular intact Sensation intact distally Dorsiflexion/Plantar flexion intact Dressing - dressing C/D/I Motor Function - intact, moving foot and toes well on exam.   Past Medical History:  Diagnosis Date   Anxiety    Asthma    Chronic pain    Depression    Diabetes mellitus without complication (HCC)    GERD (gastroesophageal reflux disease)    Headache    migraines   Hyperlipidemia    Hypertension    Hypothyroidism    Osteoarthritis    PONV (postoperative nausea and vomiting)     Assessment/Plan: 1 Day Post-Op Procedure(s) (LRB): TOTAL HIP ARTHROPLASTY ANTERIOR APPROACH (Right) Principal Problem:   OA (osteoarthritis) of  hip Active Problems:   Primary osteoarthritis of right hip  Estimated body mass index is 24.48 kg/m as calculated from the following:   Height as of this encounter: 5\' 3"  (1.6 m).   Weight as of this encounter: 62.7 kg. Advance diet Up with therapy D/C IV fluids  DVT Prophylaxis - Aspirin Weight bearing as tolerated. Begin therapy.  If problems with spasms continue, will switch to flexeril.  Plan is to go Home after hospital stay. Plan for discharge today if meeting goals with therapy. HEP Follow-up in the office in 2 weeks  The PDMP database was reviewed today prior to any opioid medications being prescribed to this patient.  , PA-C Orthopedic Surgery [phone] 04/13/2021, 7:58 AM

## 2021-04-14 DIAGNOSIS — M1611 Unilateral primary osteoarthritis, right hip: Secondary | ICD-10-CM | POA: Diagnosis not present

## 2021-04-14 LAB — GLUCOSE, CAPILLARY: Glucose-Capillary: 195 mg/dL — ABNORMAL HIGH (ref 70–99)

## 2021-04-14 LAB — CBC
HCT: 37.9 % (ref 36.0–46.0)
Hemoglobin: 12.3 g/dL (ref 12.0–15.0)
MCH: 29 pg (ref 26.0–34.0)
MCHC: 32.5 g/dL (ref 30.0–36.0)
MCV: 89.4 fL (ref 80.0–100.0)
Platelets: 209 10*3/uL (ref 150–400)
RBC: 4.24 MIL/uL (ref 3.87–5.11)
RDW: 13.9 % (ref 11.5–15.5)
WBC: 14.3 10*3/uL — ABNORMAL HIGH (ref 4.0–10.5)
nRBC: 0 % (ref 0.0–0.2)

## 2021-04-14 LAB — BASIC METABOLIC PANEL
Anion gap: 7 (ref 5–15)
BUN: 18 mg/dL (ref 8–23)
CO2: 28 mmol/L (ref 22–32)
Calcium: 8.6 mg/dL — ABNORMAL LOW (ref 8.9–10.3)
Chloride: 97 mmol/L — ABNORMAL LOW (ref 98–111)
Creatinine, Ser: 0.62 mg/dL (ref 0.44–1.00)
GFR, Estimated: >60 mL/min (ref >60)
Glucose, Bld: 226 mg/dL — ABNORMAL HIGH (ref 70–99)
Potassium: 3.4 mmol/L — ABNORMAL LOW (ref 3.5–5.1)
Sodium: 132 mmol/L — ABNORMAL LOW (ref 135–145)

## 2021-04-14 NOTE — Plan of Care (Signed)
  Problem: Activity: Goal: Ability to avoid complications of mobility impairment will improve Outcome: Progressing   Problem: Clinical Measurements: Goal: Postoperative complications will be avoided or minimized Outcome: Progressing   Problem: Pain Management: Goal: Pain level will decrease with appropriate interventions Outcome: Progressing   Problem: Skin Integrity: Goal: Will show signs of wound healing Outcome: Progressing   

## 2021-04-14 NOTE — Progress Notes (Signed)
Physical Therapy Treatment Patient Details Name: [name] MRN: [id number] DOB: Feb 16, 1956 Today's Date: 04/14/2021   History of Present Illness Pt is a 65 year old female s/p R THA direct anterior approach on 04/12/21.  PMHx significant for but not limited to OA, DM, chronic pain    PT Comments    Pt reports pain much improved today compared to yesterday. Pt ambulated in hallway, practiced safe stair technique, and performed LE exercises.  Pt provided with HEP handout and reports understanding.  Pt's significant other also present for session. Pt feels ready for d/c home today.    Recommendations for follow up therapy are one component of a multi-disciplinary discharge planning process, led by the attending physician.  Recommendations may be updated based on patient status, additional functional criteria and insurance authorization.  Follow Up Recommendations  Follow physician's recommendations for discharge plan and follow up therapies     Assistance Recommended at Discharge PRN  Equipment Recommendations  None recommended by PT    Recommendations for Other Services       Precautions / Restrictions Precautions Precautions: Fall Restrictions RLE Weight Bearing: Weight bearing as tolerated     Mobility  Bed Mobility Overal bed mobility: Needs Assistance Bed Mobility: Supine to Sit;Sit to Supine     Supine to sit: Supervision;HOB elevated Sit to supine: Supervision;HOB elevated   General bed mobility comments: verbal cues for self assist of Rt LE    Transfers Overall transfer level: Needs assistance Equipment used: Rolling walker (2 wheels) Transfers: Sit to/from Stand Sit to Stand: Min guard;Supervision           General transfer comment: verbal cues for UE and LE positioning    Ambulation/Gait Ambulation/Gait assistance: Min guard;Supervision Gait Distance (Feet): 60 Feet Assistive device: Rolling walker (2 wheels) Gait Pattern/deviations: Step-to  pattern;Decreased stance time - right;Antalgic     General Gait Details: verbal cues for sequence, RW positioning, step length; pt with improved ability to advance Rt LE today; pt also reports pain better controlled   Stairs Stairs: Yes Stairs assistance: Min guard Stair Management: Step to pattern;Forwards;Backwards;With walker Number of Stairs: 1 General stair comments: verbal cues for safety and sequence, significant other present and observed; pt performed once forwards and once backwards   Wheelchair Mobility    Modified Rankin (Stroke Patients Only)       Balance                                            Cognition Arousal/Alertness: Awake/alert Behavior During Therapy: WFL for tasks assessed/performed Overall Cognitive Status: Within Functional Limits for tasks assessed                                          Exercises Total Joint Exercises Ankle Circles/Pumps: AROM;Both;10 reps Quad Sets: AROM;10 reps;Both Heel Slides: AROM;10 reps;Right Hip ABduction/ADduction: AROM;10 reps;Right;Supine;Standing Long Arc Quad: AROM;Right;10 reps;Seated Knee Flexion: AROM;Standing;Right;10 reps Marching in Standing: AROM;Right;Standing;10 reps Standing Hip Extension: AROM;Right;10 reps;Standing    General Comments        Pertinent Vitals/Pain Pain Assessment: 0-10 Pain Score: 3  Pain Location: right hip Pain Descriptors / Indicators: Burning;Sore Pain Intervention(s): Repositioned;Monitored during session;Premedicated before session;Ice applied    Home Living  Prior Function            PT Goals (current goals can now be found in the care plan section) Progress towards PT goals: Progressing toward goals    Frequency    7X/week      PT Plan Current plan remains appropriate    Co-evaluation              AM-PAC PT "6 Clicks" Mobility   Outcome Measure  Help needed turning from  your back to your side while in a flat bed without using bedrails?: A Little Help needed moving from lying on your back to sitting on the side of a flat bed without using bedrails?: A Little Help needed moving to and from a bed to a chair (including a wheelchair)?: A Little Help needed standing up from a chair using your arms (e.g., wheelchair or bedside chair)?: A Little Help needed to walk in hospital room?: A Little Help needed climbing 3-5 steps with a railing? : A Little 6 Click Score: 18    End of Session Equipment Utilized During Treatment: Gait belt Activity Tolerance: Patient tolerated treatment well Patient left: in bed;with call bell/phone within reach;with family/visitor present Nurse Communication: Mobility status PT Visit Diagnosis: Other abnormalities of gait and mobility (R26.89)     Time: 4627-0350 PT Time Calculation (min) (ACUTE ONLY): 25 min  Charges:  $Gait Training: 8-22 mins $Therapeutic Exercise: 8-22 mins                    Thomasene Mohair PT, DPT Acute Rehabilitation Services Pager: [phone] Office: [phone]    Janan Halter [place] 04/14/2021, 3:58 PM

## 2021-04-14 NOTE — Progress Notes (Signed)
   Subjective: 2 Days Post-Op Procedure(s) (LRB): TOTAL HIP ARTHROPLASTY ANTERIOR APPROACH (Right) Patient reports pain as mild.   Patient seen in rounds by Dr. Lequita Halt. Patient is well, and has had no acute complaints or problems. Did not progress with therapy enough yesterday to safely discharge home. Denies chest pain or SOB. Plan is to go Home after hospital stay.  Objective: Vital signs in last 24 hours: Temp:  [97.7 F (36.5 C)-98.3 F (36.8 C)] 98 F (36.7 C) (11/02 0419) Pulse Rate:  [77-82] 77 (11/02 0419) Resp:  [18] 18 (11/02 0419) BP: (93-140)/(63-72) 109/71 (11/02 0419) SpO2:  [95 %-98 %] 96 % (11/02 0419)  Intake/Output from previous day:  Intake/Output Summary (Last 24 hours) at 04/14/2021 0819 Last data filed at 04/14/2021 0659 Gross per 24 hour  Intake 457.77 ml  Output 2300 ml  Net -1842.23 ml    Intake/Output this shift: No intake/output data recorded.  Labs: Recent Labs    04/13/21 0327 04/14/21 0321  HGB 12.7 12.3   Recent Labs    04/13/21 0327 04/14/21 0321  WBC 14.1* 14.3*  RBC 4.44 4.24  HCT 39.8 37.9  PLT 215 209   Recent Labs    04/13/21 0327 04/14/21 0321  NA 135 132*  K 3.7 3.4*  CL 97* 97*  CO2 27 28  BUN 15 18  CREATININE 0.92 0.62  GLUCOSE 242* 226*  CALCIUM 9.0 8.6*   No results for input(s): LABPT, INR in the last 72 hours.  Exam: General - Patient is Alert and Oriented Extremity - Neurologically intact Neurovascular intact Sensation intact distally Dorsiflexion/Plantar flexion intact Dressing/Incision - clean, dry, and intact.  Motor Function - intact, moving foot and toes well on exam.   Past Medical History:  Diagnosis Date   Anxiety    Asthma    Chronic pain    Depression    Diabetes mellitus without complication (HCC)    GERD (gastroesophageal reflux disease)    Headache    migraines   Hyperlipidemia    Hypertension    Hypothyroidism    Osteoarthritis    PONV (postoperative nausea and vomiting)      Assessment/Plan: 2 Days Post-Op Procedure(s) (LRB): TOTAL HIP ARTHROPLASTY ANTERIOR APPROACH (Right) Principal Problem:   OA (osteoarthritis) of hip Active Problems:   Primary osteoarthritis of right hip  Estimated body mass index is 24.48 kg/m as calculated from the following:   Height as of this encounter: 5\' 3"  (1.6 m).   Weight as of this encounter: 62.7 kg. Up with therapy  DVT Prophylaxis - Aspirin Weight-bearing as tolerated  Plan for discharge with HEP today.  , PA-C Orthopedic Surgery [phone] 04/14/2021, 8:19 AM

## 2021-04-14 NOTE — Progress Notes (Signed)
Provided discharge education/instructions, all questions and concerns addressed, Pt not in acute distress, discharged home with belongings accompanied by husband. 

## 2021-04-19 NOTE — Discharge Summary (Signed)
Physician Discharge Summary   Patient ID: [name] MRN: [id number] DOB/AGE: 1955/09/13 65 y.o.  Admit date: 04/12/2021 Discharge date: 04/14/2021  Primary Diagnosis: Osteoarthritis, right hip   Admission Diagnoses:  Past Medical History:  Diagnosis Date   Anxiety    Asthma    Chronic pain    Depression    Diabetes mellitus without complication (HCC)    GERD (gastroesophageal reflux disease)    Headache    migraines   Hyperlipidemia    Hypertension    Hypothyroidism    Osteoarthritis    PONV (postoperative nausea and vomiting)    Discharge Diagnoses:   Principal Problem:   OA (osteoarthritis) of hip Active Problems:   Primary osteoarthritis of right hip  Estimated body mass index is 24.48 kg/m as calculated from the following:   Height as of this encounter: 5\' 3"  (1.6 m).   Weight as of this encounter: 62.7 kg.  Procedure:  Procedure(s) (LRB): TOTAL HIP ARTHROPLASTY ANTERIOR APPROACH (Right)   Consults: None  HPI: [name] is a 65 y.o. female who has advanced end-  stage arthritis of their Right  hip with progressively worsening pain and dysfunction.The patient has failed nonoperative management and presents for total hip arthroplasty.   Laboratory Data: Admission on 04/12/2021, Discharged on 04/14/2021  Component Date Value Ref Range Status   Glucose-Capillary 04/12/2021 122 (A)  70 - 99 mg/dL Final   Glucose reference range applies only to samples taken after fasting for at least 8 hours.   Glucose-Capillary 04/12/2021 106 (A)  70 - 99 mg/dL Final   Glucose reference range applies only to samples taken after fasting for at least 8 hours.   WBC 04/13/2021 14.1 (A)  4.0 - 10.5 K/uL Final   RBC 04/13/2021 4.44  3.87 - 5.11 MIL/uL Final   Hemoglobin 04/13/2021 12.7  12.0 - 15.0 g/dL Final   HCT 48/54/6270 39.8  36.0 - 46.0 % Final   MCV 04/13/2021 89.6  80.0 - 100.0 fL Final   MCH 04/13/2021 28.6  26.0 - 34.0 pg Final   MCHC 04/13/2021 31.9  30.0 -  36.0 g/dL Final   RDW 35/00/9381 13.4  11.5 - 15.5 % Final   Platelets 04/13/2021 215  150 - 400 K/uL Final   nRBC 04/13/2021 0.0  0.0 - 0.2 % Final   Performed at [facility], [address] [place], [place] [address]   Sodium 04/13/2021 135  135 - 145 mmol/L Final   Potassium 04/13/2021 3.7  3.5 - 5.1 mmol/L Final   Chloride 04/13/2021 97 (A)  98 - 111 mmol/L Final   CO2 04/13/2021 27  22 - 32 mmol/L Final   Glucose, Bld 04/13/2021 242 (A)  70 - 99 mg/dL Final   Glucose reference range applies only to samples taken after fasting for at least 8 hours.   BUN 04/13/2021 15  8 - 23 mg/dL Final   Creatinine, Ser 04/13/2021 0.92  0.44 - 1.00 mg/dL Final   Calcium 71/69/6789 9.0  8.9 - 10.3 mg/dL Final   GFR, Estimated 04/13/2021 >60  >60 mL/min Final   Comment: (NOTE) Calculated using the CKD-EPI Creatinine Equation (2021)    Anion gap 04/13/2021 11  5 - 15 Final   Performed at [facility], [address], [place], [place] [address]   Glucose-Capillary 04/12/2021 275 (A)  70 - 99 mg/dL Final   Glucose reference range applies only to samples taken after fasting for at least 8  hours.   Glucose-Capillary 04/13/2021 218 (A)  70 - 99 mg/dL Final   Glucose reference range applies only to samples taken after fasting for at least 8 hours.   Glucose-Capillary 04/13/2021 201 (A)  70 - 99 mg/dL Final   Glucose reference range applies only to samples taken after fasting for at least 8 hours.   Glucose-Capillary 04/13/2021 278 (A)  70 - 99 mg/dL Final   Glucose reference range applies only to samples taken after fasting for at least 8 hours.   WBC 04/14/2021 14.3 (A)  4.0 - 10.5 K/uL Final   RBC 04/14/2021 4.24  3.87 - 5.11 MIL/uL Final   Hemoglobin 04/14/2021 12.3  12.0 - 15.0 g/dL Final   HCT 73/53/2992 37.9  36.0 - 46.0 % Final   MCV 04/14/2021 89.4  80.0 - 100.0 fL Final   MCH 04/14/2021 29.0  26.0 - 34.0 pg Final   MCHC 04/14/2021 32.5  30.0 - 36.0 g/dL  Final   RDW 42/68/3419 13.9  11.5 - 15.5 % Final   Platelets 04/14/2021 209  150 - 400 K/uL Final   nRBC 04/14/2021 0.0  0.0 - 0.2 % Final   Performed at [facility], [address] [place], [place] [address]   Sodium 04/14/2021 132 (A)  135 - 145 mmol/L Final   Potassium 04/14/2021 3.4 (A)  3.5 - 5.1 mmol/L Final   Chloride 04/14/2021 97 (A)  98 - 111 mmol/L Final   CO2 04/14/2021 28  22 - 32 mmol/L Final   Glucose, Bld 04/14/2021 226 (A)  70 - 99 mg/dL Final   Glucose reference range applies only to samples taken after fasting for at least 8 hours.   BUN 04/14/2021 18  8 - 23 mg/dL Final   Creatinine, Ser 04/14/2021 0.62  0.44 - 1.00 mg/dL Final   Calcium 79/89/2119 8.6 (A)  8.9 - 10.3 mg/dL Final   GFR, Estimated 04/14/2021 >60  >60 mL/min Final   Comment: (NOTE) Calculated using the CKD-EPI Creatinine Equation (2021)    Anion gap 04/14/2021 7  5 - 15 Final   Performed at [facility], [address] [place], [place] [address]   Glucose-Capillary 04/13/2021 269 (A)  70 - 99 mg/dL Final   Glucose reference range applies only to samples taken after fasting for at least 8 hours.   Glucose-Capillary 04/14/2021 195 (A)  70 - 99 mg/dL Final   Glucose reference range applies only to samples taken after fasting for at least 8 hours.  Hospital Outpatient Visit on 04/05/2021  Component Date Value Ref Range Status   MRSA, PCR 04/05/2021 POSITIVE (A)  NEGATIVE Final   Comment: RESULT CALLED TO, READ BACK BY AND VERIFIED WITH: PHILLIPS,C. @1720  ON 04/05/2021 BY COHEN,K    Staphylococcus aureus 04/05/2021 POSITIVE (A)  NEGATIVE Final   Comment: (NOTE) The Xpert SA Assay (FDA approved for NASAL specimens in patients 27 years of age and older), is one component of a comprehensive surveillance program. It is not intended to diagnose infection nor to guide or monitor treatment. Performed at [facility], [address] [place], [place]    Sodium 04/05/2021 139  135 - 145 mmol/L Final   Potassium 04/05/2021 3.8  3.5 - 5.1 mmol/L Final   Chloride 04/05/2021 98  98 - 111 mmol/L Final   CO2 04/05/2021 31  22 - 32 mmol/L Final   Glucose, Bld 04/05/2021 84  70 - 99 mg/dL Final  Glucose reference range applies only to samples taken after fasting for at least 8 hours.   BUN 04/05/2021 17  8 - 23 mg/dL Final   Creatinine, Ser 04/05/2021 0.56  0.44 - 1.00 mg/dL Final   Calcium 76/28/3151 9.5  8.9 - 10.3 mg/dL Final   GFR, Estimated 04/05/2021 >60  >60 mL/min Final   Comment: (NOTE) Calculated using the CKD-EPI Creatinine Equation (2021)    Anion gap 04/05/2021 10  5 - 15 Final   Performed at [facility], [address] [place], [place] [address]   WBC 04/05/2021 9.0  4.0 - 10.5 K/uL Final   RBC 04/05/2021 4.90  3.87 - 5.11 MIL/uL Final   Hemoglobin 04/05/2021 14.2  12.0 - 15.0 g/dL Final   HCT 73/71/0626 43.8  36.0 - 46.0 % Final   MCV 04/05/2021 89.4  80.0 - 100.0 fL Final   MCH 04/05/2021 29.0  26.0 - 34.0 pg Final   MCHC 04/05/2021 32.4  30.0 - 36.0 g/dL Final   RDW 94/85/4627 13.3  11.5 - 15.5 % Final   Platelets 04/05/2021 252  150 - 400 K/uL Final   nRBC 04/05/2021 0.0  0.0 - 0.2 % Final   Performed at [facility], [address] [place], [place] [address]   ABO/RH(D) 04/05/2021 A NEG   Final   Antibody Screen 04/05/2021 NEG   Final   Sample Expiration 04/05/2021 04/15/2021,2359   Final   Extend sample reason 04/05/2021    Final                   Value:NO TRANSFUSIONS OR PREGNANCY IN THE PAST 3 MONTHS Performed at [facility], [address], [place], [place] [address]    Glucose-Capillary 04/05/2021 93  70 - 99 mg/dL Final   Glucose reference range applies only to samples taken after fasting for at least 8 hours.  Orders Only on 03/08/2021  Component Date Value Ref Range Status   SARS Coronavirus 2 03/08/2021 RESULT:  POSITIVE (A)   Final   Comment: RESULT: POSITIVESARS-CoV-2 INTERPRETATION:A POSITIVE  test result means that SARS-CoV-2 RNA was present in the specimen above the limit of detection of this test. The presence of SARS-CoV-2 RNA is indicative of infection with SARS-CoV-2. Detection of  SARS-CoV-2 RNA may not rule-out bacterial infection or co-infection with other viruses. Positive and negative predictive values of testing are highly dependent on prevalence. False positive test results are more likely when prevalence of disease is  low.The expected result is NEGATIVE.Fact Sheet for Healthcare Providers: [employer].br Sheet for Patients: [employer].ee Reference Range - Negative   Hospital Outpatient Visit on 02/26/2021  Component Date Value Ref Range Status   MRSA, PCR 02/26/2021 POSITIVE (A)  NEGATIVE Final   Comment: RESULT CALLED TO, READ BACK BY AND VERIFIED WITH: PHILLIPS,C. RN @1649  ON 02/26/2021 BY COHEN,K    Staphylococcus aureus 02/26/2021 POSITIVE (A)  NEGATIVE Final   Comment: (NOTE) The Xpert SA Assay (FDA approved for NASAL specimens in patients 40 years of age and older), is one component of a comprehensive surveillance program. It is not intended to diagnose infection nor to guide or monitor treatment. Performed at [facility], [address] [place], [place]    WBC 02/26/2021 10.1  4.0 - 10.5 K/uL Final   RBC 02/26/2021 5.35 (A)  3.87 - 5.11 MIL/uL Final   Hemoglobin 02/26/2021 15.2 (A)  12.0 - 15.0 g/dL Final   HCT 02/28/2021 48.4 (A)  36.0 - 46.0 % Final   MCV 02/26/2021 90.5  80.0 - 100.0 fL Final   MCH 02/26/2021 28.4  26.0 - 34.0 pg Final   MCHC 02/26/2021 31.4  30.0 - 36.0 g/dL Final   RDW 16/03/9603 12.8  11.5 - 15.5 % Final   Platelets 02/26/2021 231  150 - 400 K/uL Final   nRBC 02/26/2021 0.0  0.0 - 0.2 % Final   Performed at [facility], [address], [place], [place] [address]   Sodium 02/26/2021 140  135 - 145 mmol/L Final   Potassium 02/26/2021 3.6  3.5 - 5.1 mmol/L Final   Chloride 02/26/2021 98  98 - 111 mmol/L Final   CO2 02/26/2021 32  22 - 32 mmol/L Final   Glucose, Bld 02/26/2021 71  70 - 99 mg/dL Final   Glucose reference range applies only to samples taken after fasting for at least 8 hours.   BUN 02/26/2021 29 (A)  8 - 23 mg/dL Final   Creatinine, Ser 02/26/2021 0.76  0.44 - 1.00 mg/dL Final   Calcium 11/91/4782 10.0  8.9 - 10.3 mg/dL Final   Total Protein 95/62/1308 7.8  6.5 - 8.1 g/dL Final   Albumin 65/78/4696 4.4  3.5 - 5.0 g/dL Final   AST 29/52/8413 18  15 - 41 U/L Final   ALT 02/26/2021 17  0 - 44 U/L Final   Alkaline Phosphatase 02/26/2021 54  38 - 126 U/L Final   Total Bilirubin 02/26/2021 0.8  0.3 - 1.2 mg/dL Final   GFR, Estimated 02/26/2021 >60  >60 mL/min Final   Comment: (NOTE) Calculated using the CKD-EPI Creatinine Equation (2021)    Anion gap 02/26/2021 10  5 - 15 Final   Performed at [facility], [address], [place], [place] [address]   ABO/RH(D) 02/26/2021 A NEG   Final   Antibody Screen 02/26/2021 NEG   Final   Sample Expiration 02/26/2021 03/12/2021,2359   Final   Extend sample reason 02/26/2021    Final                   Value:NO TRANSFUSIONS OR PREGNANCY IN THE PAST 3 MONTHS Performed at [facility], [address], [place], [place] [address]    Prothrombin Time 02/26/2021 12.7  11.4 - 15.2 seconds Final   INR 02/26/2021 1.0  0.8 - 1.2 Final   Comment: (NOTE) INR goal varies based on device and disease states. Performed at [facility], [address], [place], [place] [address]    Hgb A1c MFr Bld 02/26/2021 7.1 (A)  4.8 - 5.6 % Final   Comment: (NOTE) Pre diabetes:          5.7%-6.4%  Diabetes:              >6.4%  Glycemic control for   <7.0% adults with diabetes    Mean Plasma Glucose 02/26/2021 157.07  mg/dL  Final   Performed at [facility], [address], [place], [place] [address]   Glucose-Capillary 02/26/2021 100 (A)  70 - 99 mg/dL Final   Glucose reference range applies only to samples taken after fasting for at least 8 hours.     X-Rays:DG Pelvis Portable  Result Date: 04/12/2021 CLINICAL DATA:  Post right hip replacement EXAM: PORTABLE PELVIS 1-2 VIEWS COMPARISON:  None. FINDINGS: Changes of right hip replacement. Normal AP alignment. No hardware complicating feature. IMPRESSION: Right hip replacement.  No visible complicating feature.  Electronically Signed   By: Charlett Nose M.D.   On: 04/12/2021 17:31   DG C-Arm 1-60 Min-No Report  Result Date: 04/12/2021 Fluoroscopy was utilized by the requesting physician.  No radiographic interpretation.   DG C-Arm 1-60 Min-No Report  Result Date: 04/12/2021 Fluoroscopy was utilized by the requesting physician.  No radiographic interpretation.   DG HIP OPERATIVE UNILAT W OR W/O PELVIS RIGHT  Result Date: 04/12/2021 CLINICAL DATA:  Right total hip arthroplasty. EXAM: OPERATIVE right HIP (WITH PELVIS IF PERFORMED) 1 VIEWS TECHNIQUE: Fluoroscopic spot image(s) were submitted for interpretation post-operatively. COMPARISON:  None. FINDINGS: Postoperative change from right total hip arthroplasty. No signs of periprosthetic fracture or dislocation. Hardware components are in anatomic alignment. IMPRESSION: Status post right total hip arthroplasty. Electronically Signed   By: Signa Kell M.D.   On: 04/12/2021 16:25    EKG: Orders placed or performed during the hospital encounter of 02/26/21   EKG 12-Lead   EKG 12-Lead     Hospital Course: [name] is a 65 y.o. who was admitted to [facility] They were brought to the operating room on 04/12/2021 and underwent Procedure(s): TOTAL HIP ARTHROPLASTY ANTERIOR APPROACH.  Patient tolerated the procedure well and was later transferred to the recovery room and then to the  orthopaedic floor for postoperative care. They were given PO and IV analgesics for pain control following their surgery. They were given 24 hours of postoperative antibiotics of  Anti-infectives (From admission, onward)    Start     Dose/Rate Route Frequency Ordered Stop   04/13/21 0600  vancomycin (VANCOCIN) IVPB 1000 mg/200 mL premix        1,000 mg 200 mL/hr over 60 Minutes Intravenous On call to O.R. 04/12/21 1220 04/12/21 1510   04/13/21 0200  vancomycin (VANCOCIN) IVPB 1000 mg/200 mL premix        1,000 mg 200 mL/hr over 60 Minutes Intravenous Every 12 hours 04/12/21 1828 04/13/21 0413      and started on DVT prophylaxis in the form of Aspirin.   PT and OT were ordered for total joint protocol. Discharge planning consulted to help with postop disposition and equipment needs. Patient had a good night on the evening of surgery. They started to get up OOB with therapy on POD #1. Continued to work with therapy into POD #2. Pt was seen during rounds on day two and was ready to go home pending progress with therapy. Dressing was changed and the incision was clean, dry, and intact. Pt worked with therapy for one additional session and was meeting their goals. She was discharged to home later that day in stable condition.  Diet: Diabetic diet Activity: WBAT Follow-up: in 2 weeks Disposition: Home Discharged Condition: stable   Discharge Instructions     Call MD / Call 911   Complete by: As directed    If you experience chest pain or shortness of breath, CALL 911 and be transported to the hospital emergency room.  If you develope a fever above 101 F, pus (white drainage) or increased drainage or redness at the wound, or calf pain, call your surgeon's office.   Change dressing   Complete by: As directed    You have an adhesive waterproof bandage over the incision. Leave this in place until your first follow-up appointment. Once you remove this you will not need to place another bandage.    Constipation Prevention   Complete by: As directed    Drink plenty of fluids.  Prune juice may be helpful.  You may use a stool softener, such as Colace (over the counter) 100 mg twice a day.  Use MiraLax (over the counter) for constipation as needed.   Diet - low sodium heart healthy   Complete by: As directed    Do not sit on low chairs, stoools or toilet seats, as it may be difficult to get up from low surfaces   Complete by: As directed    Driving restrictions   Complete by: As directed    No driving for two weeks   Post-operative opioid taper instructions:   Complete by: As directed    POST-OPERATIVE OPIOID TAPER INSTRUCTIONS: It is important to wean off of your opioid medication as soon as possible. If you do not need pain medication after your surgery it is ok to stop day one. Opioids include: Codeine, Hydrocodone(Norco, Vicodin), Oxycodone(Percocet, oxycontin) and hydromorphone amongst others.  Long term and even short term use of opiods can cause: Increased pain response Dependence Constipation Depression Respiratory depression And more.  Withdrawal symptoms can include Flu like symptoms Nausea, vomiting And more Techniques to manage these symptoms Hydrate well Eat regular healthy meals Stay active Use relaxation techniques(deep breathing, meditating, yoga) Do Not substitute Alcohol to help with tapering If you have been on opioids for less than two weeks and do not have pain than it is ok to stop all together.  Plan to wean off of opioids This plan should start within one week post op of your joint replacement. Maintain the same interval or time between taking each dose and first decrease the dose.  Cut the total daily intake of opioids by one tablet each day Next start to increase the time between doses. The last dose that should be eliminated is the evening dose.      TED hose   Complete by: As directed    Use stockings (TED hose) for three weeks on both  leg(s).  You may remove them at night for sleeping.   Weight bearing as tolerated   Complete by: As directed       Allergies as of 04/14/2021       Reactions   Hydromorphone Shortness Of Breath   Respiratory arrest per patient   Penicillins Anaphylaxis   Erythromycin Base Nausea And Vomiting   Meperidine Hives        Medication List     TAKE these medications    aspirin 325 MG EC tablet Take 1 tablet (325 mg total) by mouth 2 (two) times daily for 20 days. Then resume one 81 mg aspirin once a day. What changed:  medication strength how much to take when to take this additional instructions   Biotin 31497 MCG Tabs Take 10,000 mcg by mouth daily. Notes to patient: Resume home regimen   buPROPion 150 MG 12 hr tablet Commonly known as: WELLBUTRIN SR Take 150 mg by mouth 2 (two) times daily.   busPIRone 10 MG tablet Commonly known as: BUSPAR Take 10 mg by mouth 2 (two) times daily.   chlorthalidone 25 MG tablet Commonly known as: HYGROTON Take 25 mg by mouth daily.   Cholecalciferol 25 MCG (1000 UT) tablet Take by mouth. Notes to patient: Resume home regimen   COLLAGEN PO Take 6,000 mg by mouth daily. Notes to patient: Resume home regimen   cyanocobalamin 2000 MCG tablet Take 2,000 mcg by mouth daily. Notes to patient: Resume home regimen   vitamin B-12 500 MCG tablet Commonly known as:  CYANOCOBALAMIN Take 500 mcg by mouth daily. Notes to patient: Resume home regimen   estradiol 0.5 MG tablet Commonly known as: ESTRACE Take 0.5 mg by mouth daily. Notes to patient: Resume home regimen   ferrous gluconate 324 MG tablet Commonly known as: FERGON Take 324 mg by mouth daily with breakfast.   furosemide 20 MG tablet Commonly known as: LASIX Take 20 mg by mouth every Monday, Wednesday, and Friday.   gabapentin 600 MG tablet Commonly known as: NEURONTIN Take 600 mg by mouth in the morning, at noon, in the evening, and at bedtime.   insulin aspart 100  UNIT/ML injection Commonly known as: novoLOG Inject 5-8 Units into the skin 3 (three) times daily before meals.   insulin glargine 100 UNIT/ML injection Commonly known as: LANTUS Inject 26 Units into the skin at bedtime.   levocetirizine 5 MG tablet Commonly known as: XYZAL Take 5 mg by mouth every evening. Notes to patient: Resume home regimen   levothyroxine 175 MCG tablet Commonly known as: SYNTHROID Take 175 mcg by mouth daily before breakfast.   lovastatin 40 MG tablet Commonly known as: MEVACOR Take 40 mg by mouth at bedtime. Notes to patient: Resume home regimen   meclizine 25 MG tablet Commonly known as: ANTIVERT Take 25 mg by mouth 3 (three) times daily as needed for dizziness. Notes to patient: Resume home regimen   metFORMIN 500 MG tablet Commonly known as: GLUCOPHAGE Take 500 mg by mouth daily with breakfast. Notes to patient: Resume home regimen   methocarbamol 500 MG tablet Commonly known as: ROBAXIN Take 1 tablet (500 mg total) by mouth every 6 (six) hours as needed for muscle spasms. Notes to patient: Last dose given 11/02 07:47am   montelukast 10 MG tablet Commonly known as: SINGULAIR Take 10 mg by mouth daily. Notes to patient: Resume home regimen   ondansetron 4 MG tablet Commonly known as: ZOFRAN Take 4 mg by mouth 3 (three) times daily as needed for refractory nausea / vomiting.   oxyCODONE 5 MG immediate release tablet Commonly known as: Oxy IR/ROXICODONE Take 1-2 tablets (5-10 mg total) by mouth every 6 (six) hours as needed for breakthrough pain. Notes to patient: Last dose given 11/02 06:59am   oxycodone-acetaminophen 10-300 MG tablet Commonly known as: LYNOX Take 1 tablet by mouth in the morning, at noon, in the evening, and at bedtime.   rizatriptan 10 MG tablet Commonly known as: MAXALT Take 10 mg by mouth every 6 (six) hours as needed for migraine. May repeat in 2 hours if needed Notes to patient: Resume home regimen    tiZANidine 4 MG capsule Commonly known as: ZANAFLEX Take 4 mg by mouth in the morning and at bedtime.   valACYclovir 500 MG tablet Commonly known as: VALTREX Take 500 mg by mouth 2 (two) times daily as needed (shingles). Notes to patient: Resume home regimen   zinc gluconate 50 MG tablet Take 50 mg by mouth daily. Notes to patient: Resume home regimen               Discharge Care Instructions  (From admission, onward)           Start     Ordered   04/13/21 0000  Weight bearing as tolerated        04/13/21 0802   04/13/21 0000  Change dressing       Comments: You have an adhesive waterproof bandage over the incision. Leave this in place until your first follow-up appointment. Once  you remove this you will not need to place another bandage.   04/13/21 0802            Follow-up Information     Ollen Gross, MD Follow up in 2 week(s).   Specialty: Orthopedic Surgery Contact information: [address] [address] [place] [address] [phone]                 Signed: Arther Abbott, PA-C Orthopedic Surgery 04/19/2021, 7:35 AM

## 2021-08-26 ENCOUNTER — Ambulatory Visit
Admission: RE | Admit: 2021-08-26 | Discharge: 2021-08-26 | Disposition: A | Discharge status: Home / Self Care | Payer: Medicare HMO | Source: Ambulatory Visit | Attending: Anesthesiology | Admitting: Anesthesiology

## 2021-08-26 ENCOUNTER — Other Ambulatory Visit: Payer: Self-pay | Admitting: Anesthesiology

## 2021-08-26 ENCOUNTER — Other Ambulatory Visit: Payer: Self-pay

## 2021-09-23 ENCOUNTER — Ambulatory Visit (INDEPENDENT_AMBULATORY_CARE_PROVIDER_SITE_OTHER): Payer: Medicare HMO

## 2021-09-23 ENCOUNTER — Ambulatory Visit
Admission: EM | Admit: 2021-09-23 | Discharge: 2021-09-23 | Disposition: A | Discharge status: Home / Self Care | Payer: Medicare HMO | Attending: Family Medicine | Admitting: Family Medicine

## 2021-09-23 DIAGNOSIS — M25511 Pain in right shoulder: Secondary | ICD-10-CM

## 2021-09-23 DIAGNOSIS — W19XXXA Unspecified fall, initial encounter: Secondary | ICD-10-CM | POA: Diagnosis not present

## 2021-09-23 NOTE — ED Provider Notes (Signed)
?[facility] ? ? ? ?CSN: [id number] ?Arrival date & time: 09/23/21  6803 ? ? ?  ? ?History   ?Chief Complaint ?Chief Complaint  ?Patient presents with  ? Fall  ?  Fall with shoulder pain ?  ? ? ?HPI ?[name] is a 66 y.o. female.  ? ?Presenting today with right shoulder pain, stiffness after stumbling yesterday at home and falling directly onto the right shoulder.  She has a history of severe arthritis with bone spurring in the shoulder at baseline but states she usually has good mobility and not this significant of pain.  Denies radiation of pain down the arm, weakness, numbness, tingling.  Has been taking her chronic pain medication (oxycodone), over-the-counter pain relievers, Robaxin with no relief. ? ? ?Past Medical History:  ?Diagnosis Date  ? Anxiety   ? Asthma   ? Chronic pain   ? Depression   ? Diabetes mellitus without complication (HCC)   ? GERD (gastroesophageal reflux disease)   ? Headache   ? migraines  ? Hyperlipidemia   ? Hypertension   ? Hypothyroidism   ? Osteoarthritis   ? PONV (postoperative nausea and vomiting)   ? ? ?Patient Active Problem List  ? Diagnosis Date Noted  ? OA (osteoarthritis) of hip 04/12/2021  ? Primary osteoarthritis of right hip 04/12/2021  ? ? ?Past Surgical History:  ?Procedure Laterality Date  ? ABDOMINAL HYSTERECTOMY    ? ANKLE RECONSTRUCTION Left   ? 1977  ? APPENDECTOMY    ? CARPAL TUNNEL RELEASE Right   ? 2010  ? catheter ablation    ? 1993  ? CESAREAN SECTION    ? x2  ? CHOLECYSTECTOMY    ? HERNIA REPAIR    ? KNEE ARTHROSCOPY W/ MENISCECTOMY Right   ? 2008  ? KNEE DISLOCATION SURGERY Right   ? 1977  ? panectomy    ? 2019  ? TOTAL HIP ARTHROPLASTY Right 04/12/2021  ? Procedure: TOTAL HIP ARTHROPLASTY ANTERIOR APPROACH;  Surgeon: Ollen Gross, MD;  Location: [facility];  Service: Orthopedics;  Laterality: Right;  ? vaginal fistula of sigmoid colon    ? ? ?OB History   ?No obstetric history on file. ?  ? ? ? ?Home Medications   ? ?Prior to Admission  medications   ?Medication Sig Start Date End Date Taking? Authorizing Provider  ?Biotin 21224 MCG TABS Take 10,000 mcg by mouth daily.    [provider]  ?buPROPion (WELLBUTRIN SR) 150 MG 12 hr tablet Take 150 mg by mouth 2 (two) times daily.    [provider]  ?busPIRone (BUSPAR) 10 MG tablet Take 10 mg by mouth 2 (two) times daily.    [provider]  ?chlorthalidone (HYGROTON) 25 MG tablet Take 25 mg by mouth daily.    [provider]  ?Cholecalciferol 25 MCG (1000 UT) tablet Take by mouth.    [provider]  ?COLLAGEN PO Take 6,000 mg by mouth daily.    [provider]  ?cyanocobalamin 2000 MCG tablet Take 2,000 mcg by mouth daily.    [provider]  ?estradiol (ESTRACE) 0.5 MG tablet Take 0.5 mg by mouth daily.    [provider]  ?ferrous gluconate (FERGON) 324 MG tablet Take 324 mg by mouth daily with breakfast.    [provider]  ?furosemide (LASIX) 20 MG tablet Take 20 mg by mouth every Monday, Wednesday, and Friday.    [provider]  ?gabapentin (NEURONTIN) 600 MG tablet Take  600 mg by mouth in the morning, at noon, in the evening, and at bedtime.    [provider]  ?insulin aspart (NOVOLOG) 100 UNIT/ML injection Inject 5-8 Units into the skin 3 (three) times daily before meals.    [provider]  ?insulin glargine (LANTUS) 100 UNIT/ML injection Inject 26 Units into the skin at bedtime.    [provider]  ?levocetirizine (XYZAL) 5 MG tablet Take 5 mg by mouth every evening.    [provider]  ?levothyroxine (SYNTHROID) 175 MCG tablet Take 175 mcg by mouth daily before breakfast.    [provider]  ?lovastatin (MEVACOR) 40 MG tablet Take 40 mg by mouth at bedtime.    [provider]  ?meclizine (ANTIVERT) 25 MG tablet Take 25 mg by mouth 3 (three) times daily as needed for dizziness.    [provider]  ?metFORMIN (GLUCOPHAGE) 500 MG tablet Take  500 mg by mouth daily with breakfast.    [provider]  ?methocarbamol (ROBAXIN) 500 MG tablet Take 1 tablet (500 mg total) by mouth every 6 (six) hours as needed for muscle spasms. 04/13/21   Edmisten, Lyn HollingsheadKristie L, PA  ?montelukast (SINGULAIR) 10 MG tablet Take 10 mg by mouth daily.    [provider]  ?ondansetron (ZOFRAN) 4 MG tablet Take 4 mg by mouth 3 (three) times daily as needed for refractory nausea / vomiting.    [provider]  ?oxyCODONE (OXY IR/ROXICODONE) 5 MG immediate release tablet Take 1-2 tablets (5-10 mg total) by mouth every 6 (six) hours as needed for breakthrough pain. 04/13/21   Edmisten, Lyn HollingsheadKristie L, PA  ?oxycodone-acetaminophen (LYNOX) 10-300 MG tablet Take 1 tablet by mouth in the morning, at noon, in the evening, and at bedtime.    [provider]  ?rizatriptan (MAXALT) 10 MG tablet Take 10 mg by mouth every 6 (six) hours as needed for migraine. May repeat in 2 hours if needed    [provider]  ?tiZANidine (ZANAFLEX) 4 MG capsule Take 4 mg by mouth in the morning and at bedtime.    [provider]  ?valACYclovir (VALTREX) 500 MG tablet Take 500 mg by mouth 2 (two) times daily as needed (shingles).    [provider]  ?vitamin B-12 (CYANOCOBALAMIN) 500 MCG tablet Take 500 mcg by mouth daily.    [provider]  ?zinc gluconate 50 MG tablet Take 50 mg by mouth daily.    [provider]  ? ? ?Family History ?No family history on file. ? ?Social History ?Social History  ? ?Tobacco Use  ? Smoking status: Never  ?  Passive exposure: Never  ? Smokeless tobacco: Never  ?Vaping Use  ? Vaping Use: Never used  ?Substance Use Topics  ? Alcohol use: Never  ? Drug use: Never  ? ?Allergies   ?Hydromorphone, Penicillins, Erythromycin base, and Meperidine ? ? ?Review of Systems ?Review of Systems ?Per HPI ? ?Physical Exam ?Triage Vital Signs ?ED Triage Vitals  ?Enc Vitals Group  ?   BP 09/23/21 1006 133/84  ?   Pulse Rate  09/23/21 1006 82  ?   Resp 09/23/21 1006 20  ?   Temp 09/23/21 1006 98.2 ?F (36.8 ?C)  ?   Temp Source 09/23/21 1006 Oral  ?   SpO2 09/23/21 1006 96 %  ?   Weight --   ?   Height --   ?   Head Circumference --   ?   Peak Flow --   ?  Pain Score 09/23/21 1004 7  ?   Pain Loc --   ?   Pain Edu? --   ?   Excl. in GC? --   ? ?No data found. ? ?Updated Vital Signs ?BP 133/84 (BP Location: Left Arm)   Pulse 82   Temp 98.2 ?F (36.8 ?C) (Oral)   Resp 20   SpO2 96%  ? ?Visual Acuity ?Right Eye Distance:   ?Left Eye Distance:   ?Bilateral Distance:   ? ?Right Eye Near:   ?Left Eye Near:    ?Bilateral Near:    ? ?Physical Exam ?Vitals and nursing note reviewed.  ?Constitutional:   ?   Appearance: Normal appearance. She is not ill-appearing.  ?HENT:  ?   Head: Atraumatic.  ?Eyes:  ?   Extraocular Movements: Extraocular movements intact.  ?   Conjunctiva/sclera: Conjunctivae normal.  ?Cardiovascular:  ?   Rate and Rhythm: Normal rate and regular rhythm.  ?   Heart sounds: Normal heart sounds.  ?Pulmonary:  ?   Effort: Pulmonary effort is normal.  ?   Breath sounds: Normal breath sounds.  ?Musculoskeletal:     ?   General: Swelling, tenderness and signs of injury present. No deformity.  ?   Cervical back: Normal range of motion and neck supple.  ?   Comments: Decreased range of motion right shoulder.  Anterior right shoulder tenderness to palpation without obvious bony deformity palpable.  Grip strength full and equal bilateral hands.  ?Skin: ?   General: Skin is warm and dry.  ?Neurological:  ?   Mental Status: She is alert and oriented to person, place, and time.  ?   Comments: Right upper extremity neurovascular intact  ?Psychiatric:     ?   Mood and Affect: Mood normal.     ?   Thought Content: Thought content normal.     ?   Judgment: Judgment normal.  ? ? ? ?UC Treatments / Results  ?Labs ?(all labs ordered are listed, but only abnormal results are displayed) ?Labs Reviewed - No data to  display ? ?EKG ? ? ?Radiology ?DG Shoulder Right ? ?Result Date: 09/23/2021 ?CLINICAL DATA:  Trauma, fall EXAM: RIGHT SHOULDER - 2+ VIEW COMPARISON:  None. FINDINGS: No fracture or dislocation is seen. Degenerative changes are noted with bony spurs

## 2021-09-23 NOTE — ED Triage Notes (Signed)
Pt states she fell last night and fell on the right shoulder  ? ?Pt states she already takes Oxycodone in her regular meds ? ?Pt states she also tried ice ?

## 2021-10-28 ENCOUNTER — Encounter: Payer: Self-pay | Admitting: Adult Health

## 2021-10-28 ENCOUNTER — Telehealth (INDEPENDENT_AMBULATORY_CARE_PROVIDER_SITE_OTHER): Payer: Medicare HMO | Admitting: Adult Health

## 2021-10-28 VITALS — BP 130/80 | HR 63 | Ht 63.0 in | Wt 203.0 lb

## 2021-10-28 DIAGNOSIS — F331 Major depressive disorder, recurrent, moderate: Secondary | ICD-10-CM

## 2021-10-28 DIAGNOSIS — F411 Generalized anxiety disorder: Secondary | ICD-10-CM | POA: Diagnosis not present

## 2021-10-28 DIAGNOSIS — G4700 Insomnia, unspecified: Secondary | ICD-10-CM | POA: Diagnosis not present

## 2021-10-28 DIAGNOSIS — F4310 Post-traumatic stress disorder, unspecified: Secondary | ICD-10-CM

## 2021-10-28 DIAGNOSIS — F410 Panic disorder [episodic paroxysmal anxiety] without agoraphobia: Secondary | ICD-10-CM

## 2021-10-28 MED ORDER — FLUOXETINE HCL 20 MG PO CAPS: 20.0000 mg | ORAL_CAPSULE | Freq: Every day | ORAL | 5 refills | Status: DC

## 2021-10-28 MED ORDER — LORAZEPAM 0.5 MG PO TABS: 0.5000 mg | ORAL_TABLET | Freq: Two times a day (BID) | ORAL | 2 refills | Status: DC
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
## 2021-11-02 ENCOUNTER — Telehealth: Payer: Self-pay

## 2021-11-02 NOTE — Telephone Encounter (Signed)
Prior Authorization submitted and approved for LORAZEPAM 0.5 MG #60/30 DAY with [employer] effective 06/13/2021-06/12/2022 PA# [id number],

## 2021-11-22 ENCOUNTER — Ambulatory Visit
Admission: EM | Admit: 2021-11-22 | Discharge: 2021-11-22 | Disposition: A | Discharge status: Home / Self Care | Payer: Medicare HMO | Attending: Family Medicine | Admitting: Family Medicine

## 2021-11-22 ENCOUNTER — Ambulatory Visit (INDEPENDENT_AMBULATORY_CARE_PROVIDER_SITE_OTHER): Payer: Medicare HMO

## 2021-11-22 VITALS — BP 119/79 | HR 74 | Temp 98.1°F | Resp 20

## 2021-11-22 DIAGNOSIS — M25532 Pain in left wrist: Secondary | ICD-10-CM

## 2021-11-22 MED ORDER — PREDNISONE 20 MG PO TABS: 40.0000 mg | ORAL_TABLET | Freq: Every day | ORAL | 0 refills | Status: DC

## 2021-11-22 NOTE — Discharge Instructions (Signed)
We are checking your uric acid levels today to see if this might be representative of a gout flare.  If so, you may take tart cherry supplements over-the-counter to help reduce your levels of uric acid.  The prednisone should help if this is gout or if this is an arthritis flare.  Follow-up with your primary care provider if not resolving.

## 2021-11-22 NOTE — ED Provider Notes (Signed)
[facility]    CSN: [id number] Arrival date & time: 11/22/21  0941      History   Chief Complaint Chief Complaint  Patient presents with   Wrist Pain    Entered by patient    HPI [name] is a 66 y.o. female.   Presenting today with left wrist pain, swelling, redness, warmth x2 days.  She denies any injury prior to onset.  Range of motion is intact but significantly painful.  Some numbness and tingling to the thumb but otherwise no numbness, tingling, weakness.  Does have a history of carpal tunnel in this hand but states that it never felt like this or looked like this.  Trying ice, her carpal tunnel brace with no relief.   Past Medical History:  Diagnosis Date   Anxiety    Asthma    Chronic pain    Depression    Diabetes mellitus without complication (HCC)    GERD (gastroesophageal reflux disease)    Headache    migraines   Hyperlipidemia    Hypertension    Hypothyroidism    Osteoarthritis    PONV (postoperative nausea and vomiting)    Patient Active Problem List   Diagnosis Date Noted   OA (osteoarthritis) of hip 04/12/2021   Primary osteoarthritis of right hip 04/12/2021   Past Surgical History:  Procedure Laterality Date   ABDOMINAL HYSTERECTOMY     ANKLE RECONSTRUCTION Left    1977   APPENDECTOMY     CARPAL TUNNEL RELEASE Right    2010   catheter ablation     1993   CESAREAN SECTION     x2   CHOLECYSTECTOMY     HERNIA REPAIR     KNEE ARTHROSCOPY W/ MENISCECTOMY Right    2008   KNEE DISLOCATION SURGERY Right    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     2019   TOTAL HIP ARTHROPLASTY Right 04/12/2021   Procedure: TOTAL HIP ARTHROPLASTY ANTERIOR APPROACH;  Surgeon: Ollen Gross, MD;  Location: [facility];  Service: Orthopedics;  Laterality: Right;   vaginal fistula of sigmoid colon     OB History   No obstetric history on file.     Home Medications    Prior to Admission medications   Medication Sig Start Date End Date Taking? Authorizing  Provider  predniSONE (DELTASONE) 20 MG tablet Take 2 tablets (40 mg total) by mouth daily with breakfast. 11/22/21  Yes Particia Nearing, PA-C  Biotin 40981 MCG TABS Take 10,000 mcg by mouth daily.    [provider]  buPROPion (WELLBUTRIN SR) 150 MG 12 hr tablet Take 150 mg by mouth 2 (two) times daily.    [provider]  busPIRone (BUSPAR) 10 MG tablet Take 10 mg by mouth 2 (two) times daily.    [provider]  chlorthalidone (HYGROTON) 25 MG tablet Take 25 mg by mouth daily.    [provider]  Cholecalciferol 25 MCG (1000 UT) tablet Take by mouth.    [provider]  COLLAGEN PO Take 6,000 mg by mouth daily.    [provider]  cyanocobalamin 2000 MCG tablet Take 2,000 mcg by mouth daily.    [provider]  estradiol (ESTRACE) 0.5 MG tablet Take 0.5 mg by mouth daily.    [provider]  ferrous gluconate (FERGON) 324 MG tablet Take 324 mg by mouth daily with breakfast.    [provider]  FLUoxetine (PROZAC) 20 MG capsule Take 1 capsule (20  mg total) by mouth daily. 10/28/21   Mozingo, Thereasa Solo, NP  furosemide (LASIX) 20 MG tablet Take 20 mg by mouth every Monday, Wednesday, and Friday.    [provider]  gabapentin (NEURONTIN) 600 MG tablet Take 600 mg by mouth in the morning, at noon, in the evening, and at bedtime.    [provider]  insulin aspart (NOVOLOG) 100 UNIT/ML injection Inject 5-8 Units into the skin 3 (three) times daily before meals.    [provider]  insulin glargine (LANTUS) 100 UNIT/ML injection Inject 26 Units into the skin at bedtime.    [provider]  levocetirizine (XYZAL) 5 MG tablet Take 5 mg by mouth every evening.    [provider]  levothyroxine (SYNTHROID) 175 MCG tablet Take 175 mcg by mouth daily before breakfast.    [provider]  LORazepam (ATIVAN) 0.5 MG tablet Take 1 tablet (0.5 mg total) by mouth 2 (two)  times daily. 10/28/21   Mozingo, Thereasa Solo, NP  lovastatin (MEVACOR) 40 MG tablet Take 40 mg by mouth at bedtime.    [provider]  meclizine (ANTIVERT) 25 MG tablet Take 25 mg by mouth 3 (three) times daily as needed for dizziness.    [provider]  metFORMIN (GLUCOPHAGE) 500 MG tablet Take 500 mg by mouth daily with breakfast.    [provider]  methocarbamol (ROBAXIN) 500 MG tablet Take 1 tablet (500 mg total) by mouth every 6 (six) hours as needed for muscle spasms. 04/13/21   Edmisten, Kristie L, PA  montelukast (SINGULAIR) 10 MG tablet Take 10 mg by mouth daily.    [provider]  ondansetron (ZOFRAN) 4 MG tablet Take 4 mg by mouth 3 (three) times daily as needed for refractory nausea / vomiting.    [provider]  oxyCODONE (OXY IR/ROXICODONE) 5 MG immediate release tablet Take 1-2 tablets (5-10 mg total) by mouth every 6 (six) hours as needed for breakthrough pain. 04/13/21   Edmisten, Lyn Hollingshead, PA  oxycodone-acetaminophen (LYNOX) 10-300 MG tablet Take 1 tablet by mouth in the morning, at noon, in the evening, and at bedtime.    [provider]  rizatriptan (MAXALT) 10 MG tablet Take 10 mg by mouth every 6 (six) hours as needed for migraine. May repeat in 2 hours if needed    [provider]  tiZANidine (ZANAFLEX) 4 MG capsule Take 4 mg by mouth in the morning and at bedtime.    [provider]  valACYclovir (VALTREX) 500 MG tablet Take 500 mg by mouth 2 (two) times daily as needed (shingles).    [provider]  vitamin B-12 (CYANOCOBALAMIN) 500 MCG tablet Take 500 mcg by mouth daily.    [provider]  zinc gluconate 50 MG tablet Take 50 mg by mouth daily.    [provider]    Family History History reviewed. No pertinent family history.  Social History Social History   Tobacco Use   Smoking status: Never    Passive exposure: Never   Smokeless tobacco: Never  Vaping Use    Vaping Use: Never used  Substance Use Topics   Alcohol use: Never   Drug use: Never     Allergies   Hydromorphone, Penicillins, Erythromycin base, Liraglutide, Meperidine, and Nsaids   Review of Systems Review of Systems PER HPI  Physical Exam Triage Vital Signs ED Triage Vitals  Enc Vitals Group     BP 11/22/21 0954 119/79  Pulse Rate 11/22/21 0954 74     Resp 11/22/21 0954 20     Temp 11/22/21 0954 98.1 F (36.7 C)     Temp src --      SpO2 11/22/21 0954 96 %     Weight --      Height --      Head Circumference --      Peak Flow --      Pain Score 11/22/21 0953 7     Pain Loc --      Pain Edu? --      Excl. in GC? --    No data found.  Updated Vital Signs BP 119/79   Pulse 74   Temp 98.1 F (36.7 C)   Resp 20   SpO2 96%   Visual Acuity Right Eye Distance:   Left Eye Distance:   Bilateral Distance:    Right Eye Near:   Left Eye Near:    Bilateral Near:     Physical Exam Vitals and nursing note reviewed.  Constitutional:      Appearance: Normal appearance. She is not ill-appearing.  HENT:     Head: Atraumatic.  Eyes:     Extraocular Movements: Extraocular movements intact.     Conjunctiva/sclera: Conjunctivae normal.  Cardiovascular:     Rate and Rhythm: Normal rate and regular rhythm.     Heart sounds: Normal heart sounds.  Pulmonary:     Effort: Pulmonary effort is normal.     Breath sounds: Normal breath sounds.  Musculoskeletal:        General: Swelling and tenderness present. No deformity or signs of injury.     Cervical back: Normal range of motion and neck supple.     Comments: Range of motion left wrist appears overall intact but very painful.  Mild to moderate edema, erythema, tenderness to palpation.  Skin:    General: Skin is warm and dry.     Findings: Erythema present.  Neurological:     Mental Status: She is alert and oriented to person, place, and time.     Comments: Left upper extremity neurovascularly intact   Psychiatric:        Mood and Affect: Mood normal.        Thought Content: Thought content normal.        Judgment: Judgment normal.      UC Treatments / Results  Labs (all labs ordered are listed, but only abnormal results are displayed) Labs Reviewed  URIC ACID    EKG   Radiology DG Wrist Complete Left  Result Date: 11/22/2021 CLINICAL DATA:  Two day history of redness, pain and swelling of left wrist. No known injury. EXAM: LEFT WRIST - COMPLETE 3+ VIEW COMPARISON:  Frontal view of the bilateral hands 09/30/2009 FINDINGS: Minimal 1-2 mm ulnar positive variance. Moderate thumb carpometacarpal joint space narrowing, subchondral sclerosis, and peripheral osteophytosis. Mild soft tissue swelling about the radiocarpal joints. No cortical erosion is seen. No acute fracture or dislocation. IMPRESSION: 1. Moderate thumb carpometacarpal osteoarthritis. 2. No cortical erosion to indicate radiographic evidence of acute osteomyelitis. 3. Mild wrist soft tissue swelling. Electronically Signed   By: Neita Garnet M.D.   On: 11/22/2021 10:49    Procedures Procedures (including critical care time)  Medications Ordered in UC Medications - No data to display  Initial Impression / Assessment and Plan / UC Course  I have reviewed the triage vital signs and the nursing notes.  Pertinent labs & imaging results that were  available during my care of the patient were reviewed by me and considered in my medical decision making (see chart for details).     X-ray showing chronic degenerative changes, no acute bony abnormalities.  Suspect osteoarthritis flare versus new gout flare.  Uric acid level pending for rule out, treat with prednisone, over-the-counter pain relievers, ice, elevation.  Return for worsening symptoms.  Final Clinical Impressions(s) / UC Diagnoses   Final diagnoses:  Left wrist pain     Discharge Instructions      We are checking your uric acid levels today to see if this  might be representative of a gout flare.  If so, you may take tart cherry supplements over-the-counter to help reduce your levels of uric acid.  The prednisone should help if this is gout or if this is an arthritis flare.  Follow-up with your primary care provider if not resolving.    ED Prescriptions     Medication Sig Dispense Auth. Provider   predniSONE (DELTASONE) 20 MG tablet Take 2 tablets (40 mg total) by mouth daily with breakfast. 10 tablet Particia NearingLane, Keyanni Whittinghill [place], [place]-C      PDMP not reviewed this encounter.   Particia NearingLane, Christie Copley [place], [place] 11/22/21 1121

## 2021-11-22 NOTE — ED Triage Notes (Signed)
Pt presents with left wrist pain and swelling, denies injury , area is hot to touch. Began a couple days ago

## 2021-11-23 LAB — URIC ACID: Uric Acid: 3.9 mg/dL (ref 3.0–7.2)

## 2021-11-29 ENCOUNTER — Ambulatory Visit: Payer: Medicare HMO | Admitting: Adult Health

## 2021-11-29 ENCOUNTER — Encounter: Payer: Self-pay | Admitting: Adult Health

## 2021-11-29 DIAGNOSIS — F4310 Post-traumatic stress disorder, unspecified: Secondary | ICD-10-CM | POA: Diagnosis not present

## 2021-11-29 DIAGNOSIS — F411 Generalized anxiety disorder: Secondary | ICD-10-CM | POA: Diagnosis not present

## 2021-11-29 DIAGNOSIS — F331 Major depressive disorder, recurrent, moderate: Secondary | ICD-10-CM | POA: Diagnosis not present

## 2021-11-29 DIAGNOSIS — G4700 Insomnia, unspecified: Secondary | ICD-10-CM | POA: Diagnosis not present

## 2021-11-29 DIAGNOSIS — F410 Panic disorder [episodic paroxysmal anxiety] without agoraphobia: Secondary | ICD-10-CM

## 2021-11-29 MED ORDER — FLUOXETINE HCL 40 MG PO CAPS: 40.0000 mg | ORAL_CAPSULE | Freq: Every day | ORAL | 2 refills | Status: DC

## 2021-11-29 NOTE — Progress Notes (Signed)
[name] [id number] [birth year] 66 y.o.  Subjective:   Patient ID:  [name] is a 66 y.o. (DOB 1955/08/20) female.  Chief Complaint: No chief complaint on file.   HPI [name] presents to the office today for follow-up of MDD, [name], [name], insomnia and panic attacks.  Describes mood today as "not the best". Pleasant. Tearful at times. Mood symptoms - reports decreased depression, anxiety, and irritability - "it comes and goes". Reports worry and rumination. Reports over thinking. Reports panic attacks - using Lorazepam as needed". Mood is better - "having less bad days then good days". Stating "I'm not as edgey as I was". Husband also commented on her improvement. Feels like the addition of Prozac has been helpful, but like to increase dose to 40mg  daily. Stable interest and motivation. Taking medications as prescribed. Followed for multiple medical issues. Energy levels about the same. Active, does not have a regular exercise routine with physical disabilities. Enjoys some usual interests and activities. Divorced from husband of 40 years. Has a dog - "[name]" 4 years. Has 3 grown children. Currently living with ex-husband and his mother - 36 years old. Spending time with family. Appetite adequate. Weight stable - 203 - 63". Sleeps well most nights. Averages 7 hours. Focus and concentration stable. Completing tasks. Managing aspects of household. 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 Denies SI or HI.  Denies AH or VH.  Has worked with a therapist previously and that was helpful.  Chronic pain - working with pain management.  Previous medication trials: Cymbalta, Prozac    Flowsheet Row ED from 09/23/2021 in [facility] Admission (Discharged) from 04/12/2021 in [facility] Pre-Admission Testing 60 from 04/05/2021 in [facility]  C-SSRS RISK  CATEGORY No Risk No Risk No Risk        Review of Systems:  Review of Systems  Musculoskeletal:  Negative for gait problem.  Neurological:  Negative for tremors.  Psychiatric/Behavioral:         Please refer to HPI    Medications: I have reviewed the patient's current medications.  Current Outpatient Medications  Medication Sig Dispense Refill   Biotin 04/07/2021 MCG TABS Take 10,000 mcg by mouth daily.     buPROPion (WELLBUTRIN SR) 150 MG 12 hr tablet Take 150 mg by mouth 2 (two) times daily.     busPIRone (BUSPAR) 10 MG tablet Take 10 mg by mouth 2 (two) times daily.     chlorthalidone (HYGROTON) 25 MG tablet Take 25 mg by mouth daily.     Cholecalciferol 25 MCG (1000 UT) tablet Take by mouth.     COLLAGEN PO Take 6,000 mg by mouth daily.     cyanocobalamin 2000 MCG tablet Take 2,000 mcg by mouth daily.     estradiol (ESTRACE) 0.5 MG tablet Take 0.5 mg by mouth daily.     ferrous gluconate (FERGON) 324 MG tablet Take 324 mg by mouth daily with breakfast.     FLUoxetine (PROZAC) 40 MG capsule Take 1 capsule (40 mg total) by mouth daily. 30 capsule 2   furosemide (LASIX) 20 MG tablet Take 20 mg by mouth every Monday, Wednesday, and Friday.     gabapentin (NEURONTIN) 600 MG tablet Take 600 mg by mouth in the morning, at noon, in the evening, and at bedtime.     insulin aspart (NOVOLOG) 100 UNIT/ML injection Inject 5-8 Units  into the skin 3 (three) times daily before meals.     insulin glargine (LANTUS) 100 UNIT/ML injection Inject 26 Units into the skin at bedtime.     levocetirizine (XYZAL) 5 MG tablet Take 5 mg by mouth every evening.     levothyroxine (SYNTHROID) 175 MCG tablet Take 175 mcg by mouth daily before breakfast.     LORazepam (ATIVAN) 0.5 MG tablet Take 1 tablet (0.5 mg total) by mouth 2 (two) times daily. 60 tablet 2   lovastatin (MEVACOR) 40 MG tablet Take 40 mg by mouth at bedtime.     meclizine (ANTIVERT) 25 MG tablet Take 25 mg by mouth 3 (three) times daily as needed  for dizziness.     metFORMIN (GLUCOPHAGE) 500 MG tablet Take 500 mg by mouth daily with breakfast.     methocarbamol (ROBAXIN) 500 MG tablet Take 1 tablet (500 mg total) by mouth every 6 (six) hours as needed for muscle spasms. 40 tablet 0   montelukast (SINGULAIR) 10 MG tablet Take 10 mg by mouth daily.     ondansetron (ZOFRAN) 4 MG tablet Take 4 mg by mouth 3 (three) times daily as needed for refractory nausea / vomiting.     oxyCODONE (OXY IR/ROXICODONE) 5 MG immediate release tablet Take 1-2 tablets (5-10 mg total) by mouth every 6 (six) hours as needed for breakthrough pain. 28 tablet 0   oxycodone-acetaminophen (LYNOX) 10-300 MG tablet Take 1 tablet by mouth in the morning, at noon, in the evening, and at bedtime.     predniSONE (DELTASONE) 20 MG tablet Take 2 tablets (40 mg total) by mouth daily with breakfast. 10 tablet 0   rizatriptan (MAXALT) 10 MG tablet Take 10 mg by mouth every 6 (six) hours as needed for migraine. May repeat in 2 hours if needed     tiZANidine (ZANAFLEX) 4 MG capsule Take 4 mg by mouth in the morning and at bedtime.     valACYclovir (VALTREX) 500 MG tablet Take 500 mg by mouth 2 (two) times daily as needed (shingles).     vitamin B-12 (CYANOCOBALAMIN) 500 MCG tablet Take 500 mcg by mouth daily.     zinc gluconate 50 MG tablet Take 50 mg by mouth daily.     No current facility-administered medications for this visit.    Medication Side Effects: None  Allergies:  Allergies  Allergen Reactions   Hydromorphone Shortness Of Breath    Respiratory arrest per patient Other reaction(s): coded with fast admin Note: Imported from external source.   Penicillins Anaphylaxis   Erythromycin Base Nausea And Vomiting   Liraglutide    Meperidine Hives   Nsaids     Other reaction(s): GI bleed Note: Imported from external source.    Past Medical History:  Diagnosis Date   Anxiety    Asthma    Chronic pain    Depression    Diabetes mellitus without complication  (HCC)    GERD (gastroesophageal reflux disease)    Headache    migraines   Hyperlipidemia    Hypertension    Hypothyroidism    Osteoarthritis    PONV (postoperative nausea and vomiting)     Past Medical History, Surgical history, Social history, and Family history were reviewed and updated as appropriate.   Please see review of systems for further details on the patient's review from today.   Objective:   Physical Exam:  There were no vitals taken for this visit.  Physical Exam Constitutional:      General:  She is not in acute distress. Musculoskeletal:        General: No deformity.  Neurological:     Mental Status: She is alert and oriented to person, place, and time.     Coordination: Coordination normal.  Psychiatric:        Attention and Perception: Attention and perception normal. She does not perceive auditory or visual hallucinations.        Mood and Affect: Mood normal. Mood is not anxious or depressed. Affect is not labile, blunt, angry or inappropriate.        Speech: Speech normal.        Behavior: Behavior normal.        Thought Content: Thought content normal. Thought content is not paranoid or delusional. Thought content does not include homicidal or suicidal ideation. Thought content does not include homicidal or suicidal plan.        Cognition and Memory: Cognition and memory normal.        Judgment: Judgment normal.     Comments: Insight intact     Lab Review:     Component Value Date/Time   NA 132 (L) 04/14/2021 0321   K 3.4 (L) 04/14/2021 0321   CL 97 (L) 04/14/2021 0321   CO2 28 04/14/2021 0321   GLUCOSE 226 (H) 04/14/2021 0321   BUN 18 04/14/2021 0321   CREATININE 0.62 04/14/2021 0321   CALCIUM 8.6 (L) 04/14/2021 0321   PROT 7.8 02/26/2021 1430   ALBUMIN 4.4 02/26/2021 1430   AST 18 02/26/2021 1430   ALT 17 02/26/2021 1430   ALKPHOS 54 02/26/2021 1430   BILITOT 0.8 02/26/2021 1430   GFRNONAA >60 04/14/2021 0321       Component Value  Date/Time   WBC 14.3 (H) 04/14/2021 0321   RBC 4.24 04/14/2021 0321   HGB 12.3 04/14/2021 0321   HCT 37.9 04/14/2021 0321   PLT 209 04/14/2021 0321   MCV 89.4 04/14/2021 0321   MCH 29.0 04/14/2021 0321   MCHC 32.5 04/14/2021 0321   RDW 13.9 04/14/2021 0321    No results found for: "POCLITH", "LITHIUM"   No results found for: "PHENYTOIN", "PHENOBARB", "VALPROATE", "CBMZ"   .res Assessment: Plan:    Plan:  PDMP reviewed  Buspar 15mg  BID Wellbutrin SR 150mg  BID Trazadone 50mg  at bedtime Ativan 0.5mg  BID prn as needed Increase Prozac 20mg  to 40mg  daily   Time spent with patient was 25 minutes. Greater than 50% of face to face time with patient was spent on counseling and coordination of care.    RTC 4 weeks  Patient advised to contact office with any questions, adverse effects, or acute worsening in signs and symptoms.   Discussed potential benefits, risk, and side effects of benzodiazepines to include potential risk of tolerance and dependence, as well as possible drowsiness.  Advised patient not to drive if experiencing drowsiness and to take lowest possible effective dose to minimize risk of dependence and tolerance.   Diagnoses and all orders for this visit:  Insomnia, unspecified type  Major depressive disorder, recurrent episode, moderate (HCC) -     FLUoxetine (PROZAC) 40 MG capsule; Take 1 capsule (40 mg total) by mouth daily.  Generalized anxiety disorder -     FLUoxetine (PROZAC) 40 MG capsule; Take 1 capsule (40 mg total) by mouth daily.  [name] (post-traumatic stress disorder) -     FLUoxetine (PROZAC) 40 MG capsule; Take 1 capsule (40 mg total) by mouth daily.  Panic attacks  Please see After Visit Summary for patient specific instructions.  No future appointments.  No orders of the defined types were placed in this encounter.   -------------------------------

## 2021-12-22 ENCOUNTER — Encounter (HOSPITAL_COMMUNITY): Payer: Self-pay | Admitting: Emergency Medicine

## 2021-12-22 ENCOUNTER — Emergency Department (HOSPITAL_COMMUNITY): Payer: Medicare Other

## 2021-12-22 ENCOUNTER — Inpatient Hospital Stay (HOSPITAL_COMMUNITY)
Admission: EM | Admit: 2021-12-22 | Discharge: 2021-12-24 | DRG: 392 | Disposition: A | Discharge status: Home / Self Care | Payer: Medicare Other | Attending: Internal Medicine | Admitting: Internal Medicine

## 2021-12-22 ENCOUNTER — Other Ambulatory Visit: Payer: Self-pay

## 2021-12-22 DIAGNOSIS — F32A Depression, unspecified: Secondary | ICD-10-CM

## 2021-12-22 DIAGNOSIS — Z96641 Presence of right artificial hip joint: Secondary | ICD-10-CM | POA: Diagnosis present

## 2021-12-22 DIAGNOSIS — Z885 Allergy status to narcotic agent status: Secondary | ICD-10-CM | POA: Diagnosis not present

## 2021-12-22 DIAGNOSIS — K529 Noninfective gastroenteritis and colitis, unspecified: Principal | ICD-10-CM

## 2021-12-22 DIAGNOSIS — E669 Obesity, unspecified: Secondary | ICD-10-CM | POA: Diagnosis present

## 2021-12-22 DIAGNOSIS — Z7984 Long term (current) use of oral hypoglycemic drugs: Secondary | ICD-10-CM

## 2021-12-22 DIAGNOSIS — Z79818 Long term (current) use of other agents affecting estrogen receptors and estrogen levels: Secondary | ICD-10-CM | POA: Diagnosis not present

## 2021-12-22 DIAGNOSIS — Z880 Allergy status to penicillin: Secondary | ICD-10-CM | POA: Diagnosis not present

## 2021-12-22 DIAGNOSIS — Z888 Allergy status to other drugs, medicaments and biological substances status: Secondary | ICD-10-CM | POA: Diagnosis not present

## 2021-12-22 DIAGNOSIS — E039 Hypothyroidism, unspecified: Secondary | ICD-10-CM | POA: Diagnosis present

## 2021-12-22 DIAGNOSIS — Z6837 Body mass index (BMI) 37.0-37.9, adult: Secondary | ICD-10-CM

## 2021-12-22 DIAGNOSIS — D509 Iron deficiency anemia, unspecified: Secondary | ICD-10-CM

## 2021-12-22 DIAGNOSIS — K625 Hemorrhage of anus and rectum: Secondary | ICD-10-CM

## 2021-12-22 DIAGNOSIS — Z79890 Hormone replacement therapy (postmenopausal): Secondary | ICD-10-CM

## 2021-12-22 DIAGNOSIS — E119 Type 2 diabetes mellitus without complications: Secondary | ICD-10-CM

## 2021-12-22 DIAGNOSIS — R7989 Other specified abnormal findings of blood chemistry: Secondary | ICD-10-CM | POA: Diagnosis present

## 2021-12-22 DIAGNOSIS — E876 Hypokalemia: Secondary | ICD-10-CM

## 2021-12-22 DIAGNOSIS — Z881 Allergy status to other antibiotic agents status: Secondary | ICD-10-CM

## 2021-12-22 DIAGNOSIS — E785 Hyperlipidemia, unspecified: Secondary | ICD-10-CM

## 2021-12-22 DIAGNOSIS — G8929 Other chronic pain: Secondary | ICD-10-CM | POA: Diagnosis not present

## 2021-12-22 DIAGNOSIS — K922 Gastrointestinal hemorrhage, unspecified: Secondary | ICD-10-CM | POA: Diagnosis present

## 2021-12-22 DIAGNOSIS — K219 Gastro-esophageal reflux disease without esophagitis: Secondary | ICD-10-CM | POA: Diagnosis present

## 2021-12-22 DIAGNOSIS — J45909 Unspecified asthma, uncomplicated: Secondary | ICD-10-CM | POA: Diagnosis present

## 2021-12-22 DIAGNOSIS — Z79899 Other long term (current) drug therapy: Secondary | ICD-10-CM

## 2021-12-22 DIAGNOSIS — Z886 Allergy status to analgesic agent status: Secondary | ICD-10-CM

## 2021-12-22 DIAGNOSIS — Z794 Long term (current) use of insulin: Secondary | ICD-10-CM | POA: Diagnosis not present

## 2021-12-22 DIAGNOSIS — I10 Essential (primary) hypertension: Secondary | ICD-10-CM

## 2021-12-22 LAB — CBC WITH DIFFERENTIAL/PLATELET
Abs Immature Granulocytes: 0.04 10*3/uL (ref 0.00–0.07)
Basophils Absolute: 0.1 10*3/uL (ref 0.0–0.1)
Basophils Relative: 0 %
Eosinophils Absolute: 0 10*3/uL (ref 0.0–0.5)
Eosinophils Relative: 0 %
HCT: 45.4 % (ref 36.0–46.0)
Hemoglobin: 15.2 g/dL — ABNORMAL HIGH (ref 12.0–15.0)
Immature Granulocytes: 0 %
Lymphocytes Relative: 11 %
Lymphs Abs: 1.6 10*3/uL (ref 0.7–4.0)
MCH: 29.7 pg (ref 26.0–34.0)
MCHC: 33.5 g/dL (ref 30.0–36.0)
MCV: 88.8 fL (ref 80.0–100.0)
Monocytes Absolute: 0.8 10*3/uL (ref 0.1–1.0)
Monocytes Relative: 6 %
Neutro Abs: 11.2 10*3/uL — ABNORMAL HIGH (ref 1.7–7.7)
Neutrophils Relative %: 83 %
Platelets: 204 10*3/uL (ref 150–400)
RBC: 5.11 MIL/uL (ref 3.87–5.11)
RDW: 12.9 % (ref 11.5–15.5)
WBC: 13.7 10*3/uL — ABNORMAL HIGH (ref 4.0–10.5)
nRBC: 0 % (ref 0.0–0.2)

## 2021-12-22 LAB — I-STAT CHEM 8, ED
BUN: 17 mg/dL (ref 8–23)
Calcium, Ion: 1.02 mmol/L — ABNORMAL LOW (ref 1.15–1.40)
Chloride: 99 mmol/L (ref 98–111)
Creatinine, Ser: 0.6 mg/dL (ref 0.44–1.00)
Glucose, Bld: 244 mg/dL — ABNORMAL HIGH (ref 70–99)
HCT: 46 % (ref 36.0–46.0)
Hemoglobin: 15.6 g/dL — ABNORMAL HIGH (ref 12.0–15.0)
Potassium: 3.3 mmol/L — ABNORMAL LOW (ref 3.5–5.1)
Sodium: 139 mmol/L (ref 135–145)
TCO2: 26 mmol/L (ref 22–32)

## 2021-12-22 LAB — COMPREHENSIVE METABOLIC PANEL
ALT: 18 U/L (ref 0–44)
AST: 20 U/L (ref 15–41)
Albumin: 3.3 g/dL — ABNORMAL LOW (ref 3.5–5.0)
Alkaline Phosphatase: 64 U/L (ref 38–126)
Anion gap: 10 (ref 5–15)
BUN: 18 mg/dL (ref 8–23)
CO2: 26 mmol/L (ref 22–32)
Calcium: 8.7 mg/dL — ABNORMAL LOW (ref 8.9–10.3)
Chloride: 101 mmol/L (ref 98–111)
Creatinine, Ser: 0.82 mg/dL (ref 0.44–1.00)
GFR, Estimated: >60 mL/min (ref >60)
Glucose, Bld: 245 mg/dL — ABNORMAL HIGH (ref 70–99)
Potassium: 3.2 mmol/L — ABNORMAL LOW (ref 3.5–5.1)
Sodium: 137 mmol/L (ref 135–145)
Total Bilirubin: 0.6 mg/dL (ref 0.3–1.2)
Total Protein: 6.3 g/dL — ABNORMAL LOW (ref 6.5–8.1)

## 2021-12-22 LAB — GLUCOSE, CAPILLARY
Glucose-Capillary: 237 mg/dL — ABNORMAL HIGH (ref 70–99)
Glucose-Capillary: 247 mg/dL — ABNORMAL HIGH (ref 70–99)

## 2021-12-22 LAB — PROTIME-INR
INR: 1.1 (ref 0.8–1.2)
Prothrombin Time: 14 seconds (ref 11.4–15.2)

## 2021-12-22 LAB — TYPE AND SCREEN
ABO/RH(D): A NEG
Antibody Screen: NEGATIVE

## 2021-12-22 LAB — HEMOGLOBIN A1C
Hgb A1c MFr Bld: 7.3 % — ABNORMAL HIGH (ref 4.8–5.6)
Mean Plasma Glucose: 162.81 mg/dL

## 2021-12-22 LAB — C DIFFICILE QUICK SCREEN W PCR REFLEX
C Diff antigen: NEGATIVE
C Diff interpretation: NOT DETECTED
C Diff toxin: NEGATIVE

## 2021-12-22 LAB — LIPASE, BLOOD: Lipase: 36 U/L (ref 11–51)

## 2021-12-22 LAB — POC OCCULT BLOOD, ED: Occult Blood, Feces: POSITIVE

## 2021-12-22 LAB — LACTIC ACID, PLASMA: Lactic Acid, Venous: 2.1 mmol/L (ref 0.5–1.9)

## 2021-12-22 LAB — HIV ANTIBODY (ROUTINE TESTING W REFLEX): HIV Screen 4th Generation wRfx: NONREACTIVE

## 2021-12-22 MED ORDER — LACTATED RINGERS IV SOLN: INTRAVENOUS | Status: DC

## 2021-12-22 MED ORDER — IPRATROPIUM-ALBUTEROL 0.5-2.5 (3) MG/3ML IN SOLN: 3.0000 mL | RESPIRATORY_TRACT | Status: DC | PRN

## 2021-12-22 MED ORDER — INSULIN ASPART 100 UNIT/ML IJ SOLN
0.0000 [IU] | Freq: Three times a day (TID) | INTRAMUSCULAR | Status: DC
  Administered 2021-12-22 – 2021-12-23 (×2): 3 [IU] via SUBCUTANEOUS
  Administered 2021-12-23: 2 [IU] via SUBCUTANEOUS
  Administered 2021-12-23: 3 [IU] via SUBCUTANEOUS
  Administered 2021-12-24 (×2): 2 [IU] via SUBCUTANEOUS

## 2021-12-22 MED ORDER — LACTATED RINGERS IV BOLUS
500.0000 mL | Freq: Once | INTRAVENOUS | Status: AC
  Administered 2021-12-22: 500 mL via INTRAVENOUS

## 2021-12-22 MED ORDER — METOPROLOL TARTRATE 5 MG/5ML IV SOLN: 5.0000 mg | INTRAVENOUS | Status: DC | PRN

## 2021-12-22 MED ORDER — METRONIDAZOLE 500 MG/100ML IV SOLN
500.0000 mg | Freq: Three times a day (TID) | INTRAVENOUS | Status: DC
  Administered 2021-12-22 – 2021-12-24 (×7): 500 mg via INTRAVENOUS
  Filled 2021-12-22 (×7): qty 100

## 2021-12-22 MED ORDER — OXYCODONE HCL 5 MG PO TABS
5.0000 mg | ORAL_TABLET | ORAL | Status: DC | PRN
  Administered 2021-12-23 – 2021-12-24 (×2): 5 mg via ORAL
  Filled 2021-12-22 (×2): qty 1

## 2021-12-22 MED ORDER — GUAIFENESIN 100 MG/5ML PO LIQD: 5.0000 mL | ORAL | Status: DC | PRN

## 2021-12-22 MED ORDER — POTASSIUM CHLORIDE 10 MEQ/100ML IV SOLN
10.0000 meq | INTRAVENOUS | Status: AC
  Administered 2021-12-22 (×3): 10 meq via INTRAVENOUS
  Filled 2021-12-22 (×3): qty 100

## 2021-12-22 MED ORDER — CALCIUM GLUCONATE-NACL 1-0.675 GM/50ML-% IV SOLN: 1.0000 g | Freq: Once | INTRAVENOUS | Status: DC

## 2021-12-22 MED ORDER — ACETAMINOPHEN 325 MG PO TABS: 650.0000 mg | ORAL_TABLET | Freq: Four times a day (QID) | ORAL | Status: DC | PRN

## 2021-12-22 MED ORDER — LACTATED RINGERS IV BOLUS
500.0000 mL | Freq: Once | INTRAVENOUS | Status: DC
Start: 2021-12-22 — End: 2021-12-22

## 2021-12-22 MED ORDER — ONDANSETRON HCL 4 MG/2ML IJ SOLN
4.0000 mg | Freq: Once | INTRAMUSCULAR | Status: AC
  Administered 2021-12-22: 4 mg via INTRAVENOUS
  Filled 2021-12-22: qty 2

## 2021-12-22 MED ORDER — FENTANYL CITRATE (PF) 100 MCG/2ML IJ SOLN
100.0000 ug | Freq: Once | INTRAMUSCULAR | Status: AC
  Administered 2021-12-22: 100 ug via INTRAVENOUS
  Filled 2021-12-22: qty 2

## 2021-12-22 MED ORDER — METOCLOPRAMIDE HCL 5 MG/ML IJ SOLN
10.0000 mg | Freq: Once | INTRAMUSCULAR | Status: AC
  Administered 2021-12-22: 10 mg via INTRAVENOUS
  Filled 2021-12-22: qty 2

## 2021-12-22 MED ORDER — HYDRALAZINE HCL 20 MG/ML IJ SOLN: 10.0000 mg | INTRAMUSCULAR | Status: DC | PRN

## 2021-12-22 MED ORDER — TRAZODONE HCL 50 MG PO TABS: 50.0000 mg | ORAL_TABLET | Freq: Every evening | ORAL | Status: DC | PRN

## 2021-12-22 MED ORDER — BISACODYL 5 MG PO TBEC: 5.0000 mg | DELAYED_RELEASE_TABLET | Freq: Every day | ORAL | Status: DC | PRN

## 2021-12-22 MED ORDER — LACTATED RINGERS IV BOLUS: 1000.0000 mL | Freq: Once | INTRAVENOUS | Status: DC

## 2021-12-22 MED ORDER — SENNOSIDES-DOCUSATE SODIUM 8.6-50 MG PO TABS: 1.0000 | ORAL_TABLET | Freq: Every evening | ORAL | Status: DC | PRN

## 2021-12-22 MED ORDER — MORPHINE SULFATE (PF) 2 MG/ML IV SOLN
2.0000 mg | INTRAVENOUS | Status: DC | PRN
  Administered 2021-12-22 – 2021-12-23 (×5): 2 mg via INTRAVENOUS
  Filled 2021-12-22 (×5): qty 1

## 2021-12-22 MED ORDER — ACETAMINOPHEN 650 MG RE SUPP: 650.0000 mg | Freq: Four times a day (QID) | RECTAL | Status: DC | PRN

## 2021-12-22 MED ORDER — PROCHLORPERAZINE EDISYLATE 10 MG/2ML IJ SOLN
10.0000 mg | Freq: Four times a day (QID) | INTRAMUSCULAR | Status: DC | PRN
  Administered 2021-12-22 – 2021-12-23 (×2): 10 mg via INTRAVENOUS
  Filled 2021-12-22 (×2): qty 2

## 2021-12-22 MED ORDER — IOHEXOL 350 MG/ML SOLN
100.0000 mL | Freq: Once | INTRAVENOUS | Status: AC | PRN
  Administered 2021-12-22: 100 mL via INTRAVENOUS

## 2021-12-22 MED ORDER — CIPROFLOXACIN IN D5W 400 MG/200ML IV SOLN
400.0000 mg | Freq: Two times a day (BID) | INTRAVENOUS | Status: DC
  Administered 2021-12-22 – 2021-12-24 (×5): 400 mg via INTRAVENOUS
  Filled 2021-12-22 (×5): qty 200

## 2021-12-22 NOTE — ED Provider Notes (Signed)
[facility] EMERGENCY DEPARTMENT Provider Note   CSN: [id number] Arrival date & time: 12/22/21  0102     History  Chief Complaint  Patient presents with   Abdominal Pain   Rectal Bleeding   Emesis    [name] is a 66 y.o. female.   Abdominal Pain Associated symptoms: fatigue, fever, hematochezia, nausea and vomiting   Rectal Bleeding Associated symptoms: abdominal pain, fever and vomiting   Emesis Associated symptoms: abdominal pain and fever   Patient presents for abdominal pain and rectal bleeding.  Medical history includes anxiety, depression, chronic pain, asthma, DM, GERD, HLD, HTN, hypothyroidism. Surgical history includes appendectomy,  cholecystectomy and hernia repair. She denies any history of rectal bleeding other than some minor hemorrhoids in the past.  She does have a history of dysphagia and, for this reason, she is seen by Celso Amy with [facility].  She developed upper abdominal pain yesterday.  She was seen by her GI doctor who planned on a scope later this summer.  Last night, she developed generalized abdominal pain, nausea, vomiting, and bright red blood per rectum.  Onset of symptoms was approximately 10 PM.  Symptoms have persisted throughout the night.  She describes large-volume blood loss.  She has pain in her abdomen as well as in her rectal area.  She is not on any blood thinning medications.     Home Medications Prior to Admission medications   Medication Sig Start Date End Date Taking? Authorizing Provider  buPROPion (WELLBUTRIN SR) 150 MG 12 hr tablet Take 150 mg by mouth 2 (two) times daily.   Yes [provider]  busPIRone (BUSPAR) 10 MG tablet Take 15 mg by mouth 2 (two) times daily.   Yes [provider]  Cholecalciferol 25 MCG (1000 UT) tablet Take 1,000 Units by mouth daily.   Yes [provider]  COLLAGEN PO Take 6,000 mg by mouth daily.   Yes [provider]  cyanocobalamin 2000 MCG tablet Take  2,000 mcg by mouth daily.   Yes [provider]  estradiol (ESTRACE) 0.5 MG tablet Take 0.5 mg by mouth daily.   Yes [provider]  ferrous gluconate (FERGON) 324 MG tablet Take 324 mg by mouth daily with breakfast.   Yes [provider]  fluconazole (DIFLUCAN) 150 MG tablet Take 150 mg by mouth once a week. 10/17/21  Yes [provider]  FLUoxetine (PROZAC) 40 MG capsule Take 1 capsule (40 mg total) by mouth daily. 11/29/21  Yes Mozingo, Thereasa Solo, NP  furosemide (LASIX) 20 MG tablet Take 20 mg by mouth every Monday, Wednesday, and Friday.   Yes [provider]  gabapentin (NEURONTIN) 600 MG tablet Take 600 mg by mouth in the morning, at noon, in the evening, and at bedtime.   Yes [provider]  hydrOXYzine (ATARAX) 25 MG tablet Take 25 mg by mouth 3 (three) times daily. 12/15/21  Yes [provider]  insulin aspart (NOVOLOG) 100 UNIT/ML injection Inject 5-8 Units into the skin 3 (three) times daily before meals.   Yes [provider]  Insulin Glargine-Lixisenatide (SOLIQUA) 100-33 UNT-MCG/ML SOPN Inject 32 Units into the skin at bedtime. 10/26/21  Yes [provider]  levocetirizine (XYZAL) 5 MG tablet Take 5 mg by mouth every evening.   Yes [provider]  levothyroxine (SYNTHROID) 175 MCG tablet Take 175 mcg by mouth daily before breakfast.   Yes [provider]  LORazepam (ATIVAN) 0.5 MG tablet Take 1 tablet (0.5  mg total) by mouth 2 (two) times daily. 10/28/21  Yes Mozingo, Berdie Ogren, NP  lovastatin (MEVACOR) 40 MG tablet Take 40 mg by mouth at bedtime.   Yes [provider]  meclizine (ANTIVERT) 25 MG tablet Take 25 mg by mouth 3 (three) times daily as needed for dizziness.   Yes [provider]  metFORMIN (GLUCOPHAGE) 500 MG tablet Take 500 mg by mouth daily as needed (sugar).   Yes [provider]  montelukast (SINGULAIR) 10 MG tablet Take 10 mg by mouth  daily.   Yes [provider]  ondansetron (ZOFRAN) 4 MG tablet Take 4 mg by mouth 3 (three) times daily as needed for refractory nausea / vomiting.   Yes [provider]  oxybutynin (DITROPAN) 5 MG tablet Take 5 mg by mouth daily. 12/15/21  Yes [provider]  oxyCODONE (OXY IR/ROXICODONE) 5 MG immediate release tablet Take 1-2 tablets (5-10 mg total) by mouth every 6 (six) hours as needed for breakthrough pain. Patient taking differently: Take 15 mg by mouth every 6 (six) hours as needed for breakthrough pain. 04/13/21  Yes Edmisten, Kristie L, PA  pantoprazole (PROTONIX) 40 MG tablet Take 40 mg by mouth daily. 10/13/21  Yes [provider]  rizatriptan (MAXALT) 10 MG tablet Take 10 mg by mouth every 6 (six) hours as needed for migraine. May repeat in 2 hours if needed   Yes [provider]  tiZANidine (ZANAFLEX) 4 MG capsule Take 4 mg by mouth in the morning and at bedtime.   Yes [provider]  traZODone (DESYREL) 50 MG tablet Take 50 mg by mouth at bedtime. 10/21/21  Yes [provider]  valACYclovir (VALTREX) 500 MG tablet Take 500 mg by mouth 2 (two) times daily as needed (shingles).   Yes [provider]  zinc gluconate 50 MG tablet Take 50 mg by mouth daily.   Yes [provider]      Allergies    Hydromorphone, Penicillins, Erythromycin base, Ibuprofen, Liraglutide, Meperidine, and Nsaids    Review of Systems   Review of Systems  Constitutional:  Positive for fatigue and fever.  Gastrointestinal:  Positive for abdominal pain, anal bleeding, blood in stool, hematochezia, nausea and vomiting.    Physical Exam Updated Vital Signs BP 121/70 (BP Location: Right Arm)   Pulse 72   Temp 98.7 F (37.1 C) (Oral)   Resp 20   Ht 5\' 3"  (1.6 m)   Wt 95.1 kg   SpO2 96%   BMI 37.14 kg/m  Physical Exam Vitals and nursing note reviewed. Exam conducted with a chaperone present.  Constitutional:      Appearance:  She is well-developed. She is ill-appearing. She is not toxic-appearing or diaphoretic.  HENT:     Head: Normocephalic and atraumatic.     Mouth/Throat:     Mouth: Mucous membranes are moist.     Pharynx: Oropharynx is clear.  Eyes:     General: No scleral icterus.    Extraocular Movements: Extraocular movements intact.     Conjunctiva/sclera: Conjunctivae normal.  Cardiovascular:     Rate and Rhythm: Normal rate and regular rhythm.     Heart sounds: No murmur heard. Pulmonary:     Effort: Pulmonary effort is normal. No respiratory distress.     Breath sounds: Normal breath sounds. No wheezing or rales.  Abdominal:     Palpations: Abdomen is soft.     Tenderness: There is generalized abdominal tenderness. There is guarding.  Genitourinary:  Rectum: Guaiac result positive. No mass, external hemorrhoid or internal hemorrhoid. Normal anal tone.     Comments: Gross blood present on DRE Musculoskeletal:        General: No swelling.     Cervical back: Neck supple.  Skin:    General: Skin is warm and dry.     Coloration: Skin is not cyanotic, jaundiced or pale.  Neurological:     General: No focal deficit present.     Mental Status: She is alert and oriented to person, place, and time.  Psychiatric:        Mood and Affect: Mood normal.        Behavior: Behavior normal.     ED Results / Procedures / Treatments   Labs (all labs ordered are listed, but only abnormal results are displayed) Labs Reviewed  COMPREHENSIVE METABOLIC PANEL - Abnormal; Notable for the following components:      Result Value   Potassium 3.2 (*)    Glucose, Bld 245 (*)    Calcium 8.7 (*)    Total Protein 6.3 (*)    Albumin 3.3 (*)    All other components within normal limits  CBC WITH DIFFERENTIAL/PLATELET - Abnormal; Notable for the following components:   WBC 13.7 (*)    Hemoglobin 15.2 (*)    Neutro Abs 11.2 (*)    All other components within normal limits  LACTIC ACID, PLASMA - Abnormal;  Notable for the following components:   Lactic Acid, Venous 2.1 (*)    All other components within normal limits  HEMOGLOBIN A1C - Abnormal; Notable for the following components:   Hgb A1c MFr Bld 7.3 (*)    All other components within normal limits  GLUCOSE, CAPILLARY - Abnormal; Notable for the following components:   Glucose-Capillary 237 (*)    All other components within normal limits  COMPREHENSIVE METABOLIC PANEL - Abnormal; Notable for the following components:   Potassium 3.3 (*)    Glucose, Bld 217 (*)    Calcium 8.4 (*)    Total Protein 5.8 (*)    Albumin 2.8 (*)    All other components within normal limits  CBC - Abnormal; Notable for the following components:   WBC 13.9 (*)    All other components within normal limits  MAGNESIUM - Abnormal; Notable for the following components:   Magnesium 1.5 (*)    All other components within normal limits  PHOSPHORUS - Abnormal; Notable for the following components:   Phosphorus 2.4 (*)    All other components within normal limits  GLUCOSE, CAPILLARY - Abnormal; Notable for the following components:   Glucose-Capillary 247 (*)    All other components within normal limits  GLUCOSE, CAPILLARY - Abnormal; Notable for the following components:   Glucose-Capillary 219 (*)    All other components within normal limits  GLUCOSE, CAPILLARY - Abnormal; Notable for the following components:   Glucose-Capillary 217 (*)    All other components within normal limits  GLUCOSE, CAPILLARY - Abnormal; Notable for the following components:   Glucose-Capillary 181 (*)    All other components within normal limits  CBC - Abnormal; Notable for the following components:   WBC 12.4 (*)    All other components within normal limits  BASIC METABOLIC PANEL - Abnormal; Notable for the following components:   Potassium 3.4 (*)    Glucose, Bld 159 (*)    BUN 7 (*)    Calcium 8.4 (*)    All other components within normal  limits  GLUCOSE, CAPILLARY -  Abnormal; Notable for the following components:   Glucose-Capillary 180 (*)    All other components within normal limits  GLUCOSE, CAPILLARY - Abnormal; Notable for the following components:   Glucose-Capillary 157 (*)    All other components within normal limits  I-STAT CHEM 8, ED - Abnormal; Notable for the following components:   Potassium 3.3 (*)    Glucose, Bld 244 (*)    Calcium, Ion 1.02 (*)    Hemoglobin 15.6 (*)    All other components within normal limits  C DIFFICILE QUICK SCREEN W PCR REFLEX    GASTROINTESTINAL PANEL BY PCR, STOOL (REPLACES STOOL CULTURE)  LIPASE, BLOOD  PROTIME-INR  HIV ANTIBODY (ROUTINE TESTING W REFLEX)  PROTIME-INR  URINALYSIS, ROUTINE W REFLEX MICROSCOPIC  POC OCCULT BLOOD, ED  TYPE AND SCREEN    EKG EKG Interpretation  Date/Time:  Wednesday December 22 2021 07:44:25 EDT Ventricular Rate:  83 PR Interval:  147 QRS Duration: 155 QT Interval:  426 QTC Calculation: 501 R Axis:   37 Text Interpretation: Sinus rhythm Right bundle branch block Confirmed by Godfrey Pick ([id number]) on 12/22/2021 7:46:55 AM  Radiology No results found.  Procedures Procedures    Medications Ordered in ED Medications  lactated ringers infusion ( Intravenous New Bag/Given 12/23/21 2107)  lactated ringers bolus 1,000 mL (has no administration in time range)  metroNIDAZOLE (FLAGYL) IVPB 500 mg (500 mg Intravenous New Bag/Given 12/24/21 0834)  ciprofloxacin (CIPRO) IVPB 400 mg (400 mg Intravenous New Bag/Given 12/24/21 0953)  acetaminophen (TYLENOL) tablet 650 mg (has no administration in time range)    Or  acetaminophen (TYLENOL) suppository 650 mg (has no administration in time range)  oxyCODONE (Oxy IR/ROXICODONE) immediate release tablet 5 mg (5 mg Oral Given 12/23/21 1559)  morphine (PF) 2 MG/ML injection 2 mg (2 mg Intravenous Given 12/23/21 2332)  traZODone (DESYREL) tablet 50 mg (has no administration in time range)  senna-docusate (Senokot-S) tablet 1 tablet (has no  administration in time range)  bisacodyl (DULCOLAX) EC tablet 5 mg (has no administration in time range)  hydrALAZINE (APRESOLINE) injection 10 mg (has no administration in time range)  metoprolol tartrate (LOPRESSOR) injection 5 mg (has no administration in time range)  guaiFENesin (ROBITUSSIN) 100 MG/5ML liquid 5 mL (has no administration in time range)  ipratropium-albuterol (DUONEB) 0.5-2.5 (3) MG/3ML nebulizer solution 3 mL (has no administration in time range)  calcium gluconate 1 g/ 50 mL sodium chloride IVPB (has no administration in time range)  insulin aspart (novoLOG) injection 0-9 Units (2 Units Subcutaneous Given 12/24/21 0833)  prochlorperazine (COMPAZINE) injection 10 mg (10 mg Intravenous Given 12/23/21 1103)  fentaNYL (SUBLIMAZE) injection 100 mcg (100 mcg Intravenous Given 12/22/21 0754)  ondansetron (ZOFRAN) injection 4 mg (4 mg Intravenous Given 12/22/21 0750)  lactated ringers bolus 500 mL (0 mLs Intravenous Stopped 12/22/21 0923)  iohexol (OMNIPAQUE) 350 MG/ML injection 100 mL (100 mLs Intravenous Contrast Given 12/22/21 0834)  fentaNYL (SUBLIMAZE) injection 100 mcg (100 mcg Intravenous Given 12/22/21 1007)  metoCLOPramide (REGLAN) injection 10 mg (10 mg Intravenous Given 12/22/21 1007)  potassium chloride 10 mEq in 100 mL IVPB (0 mEq Intravenous Stopped 12/22/21 1352)  potassium chloride 10 mEq in 100 mL IVPB (10 mEq Intravenous New Bag/Given 12/23/21 1228)    ED Course/ Medical Decision Making/ A&P                           Medical Decision Making Amount and/or  Complexity of Data Reviewed Labs: ordered. Radiology: ordered.  Risk Prescription drug management. Decision regarding hospitalization.   This patient presents to the ED for concern of abdominal pain and rectal bleeding, this involves an extensive number of treatment options, and is a complaint that carries with it a high risk of complications and morbidity.  The differential diagnosis includes mesenteric ischemia,  colitis, diverticulitis, hemorrhoids, PUD   Co morbidities that complicate the patient evaluation  anxiety, depression, chronic pain, asthma, DM, GERD, HLD, HTN, hypothyroidism   Additional history obtained:  Additional history obtained from N/A External records from outside source obtained and reviewed including EMR   Lab Tests:  I Ordered, and personally interpreted labs.  The pertinent results include: Leukocytosis is present.  No decrease in hemoglobin is present on initial lab work.  Patient has mild hypokalemia and hyperglycemia.  Initial lactic acid is elevated at 2.1.   Imaging Studies ordered:  I ordered imaging studies including CTA of abdomen and pelvis I independently visualized and interpreted imaging which showed no vessel occlusion is identified; there is colonic mucosal thickening at splenic flexure raising concern for ischemic versus infectious/inflammatory colitis. I agree with the radiologist interpretation   Cardiac Monitoring: / EKG:  The patient was maintained on a cardiac monitor.  I personally viewed and interpreted the cardiac monitored which showed an underlying rhythm of: Sinus rhythm   Consultations Obtained:  I requested consultation with the general surgeon, Dr. Okey Dupre and gastroenterologist, Dr. Gala Romney,  and discussed lab and imaging findings as well as pertinent plan - they recommend: Medical management for now with IV fluid resuscitation and antibiotics.  Both will follow in consult during admission.   Problem List / ED Course / Critical interventions / Medication management  Patient is a 66 year old female who presents for generalized abdominal pain, nausea, vomiting, and bright red blood per rectum.  Onset of symptoms was last night.  Prior to that, she did experience some epigastric pain and was seen by GI doctor yesterday.  Patient arrives with normal heart rate and moderate hypertension.  She does appear uncomfortable on exam.  She does  have generalized abdominal tenderness.  On inspection of rectal area, she has active bleeding that is red in color.  Fentanyl and Zofran were given for symptomatic relief.  Patient was kept n.p.o. and placed on maintenance IV fluids.  Laboratory work-up was initiated.  CTA of abdomen pelvis was ordered to further assess lower GI bleed.  On initial lab work, no drop in hemoglobin is present.  She does have a leukocytosis in addition to a hypokalemia.  Potassium chloride was ordered.  On CT scan, patient has colonic mucosal thickening at splenic flexure.  Possible etiologies include nonocclusive ischemic disease and/or infectious/inflammatory.  Antibiotics were ordered.  Given her history of penicillin allergy, ciprofloxacin and Flagyl were initiated.  Bolus of IV fluids was ordered.  On reassessment, patient does endorse improved pain.  Vital signs remained stable.  I spoke with general surgeon and gastroenterologist on-call.  General surgery came and evaluated the patient in the ED.  She does not feel that there is an acute need for surgical management.  She will continue to follow along in consult.  She recommends medical admission for now.  Gastroenterologist also recommends medical management with IV fluids and antibiotics.  He will also follow in consult.  Patient was admitted to hospitalist for further management. I ordered medication including IV fluids for volume resuscitation; fentanyl for analgesia; Zofran and Reglan for nausea; ciprofloxacin  and Flagyl for colitis; potassium chloride for hypokalemia Reevaluation of the patient after these medicines showed that the patient improved I have reviewed the patients home medicines and have made adjustments as needed   Social Determinants of Health:  Lives independently   CRITICAL CARE Performed by: Godfrey Pick   Total critical care time: 35 minutes  Critical care time was exclusive of separately billable procedures and treating other  patients.  Critical care was necessary to treat or prevent imminent or life-threatening deterioration.  Critical care was time spent personally by me on the following activities: development of treatment plan with patient and/or surrogate as well as nursing, discussions with consultants, evaluation of patient's response to treatment, examination of patient, obtaining history from patient or surrogate, ordering and performing treatments and interventions, ordering and review of laboratory studies, ordering and review of radiographic studies, pulse oximetry and re-evaluation of patient's condition.         Final Clinical Impression(s) / ED Diagnoses Final diagnoses:  Lower GI bleeding  Colitis    Rx / DC Orders ED Discharge Orders     None         Godfrey Pick, MD 12/24/21 1032

## 2021-12-22 NOTE — ED Notes (Signed)
Husband made aware of room assignment.

## 2021-12-22 NOTE — ED Triage Notes (Signed)
Pt reports vomiting, frequent bowel movements, cramping, and blood clots passing through the rectum.

## 2021-12-22 NOTE — H&P (Signed)
History and Physical    MAURENE Tapia VXB:[id number] DOB: [birth year] DOA: 12/22/2021  PCP: Waldron Session, FNP Patient coming from: Home  Chief Complaint: Rectal bleeding  HPI: [name] is a 66 y.o. female with medical history significant of DM2, HTN, hypothyroidism, GERD, depression, iron deficiency anemia, osteoarthritis, chronic pain, sigmoidectomy secondary to diverticulosis back in 2012 comes to the hospital with complaints of rectal bleeding.  Patient states yesterday evening she started having abdominal discomfort with mild nausea and vomiting followed by multiple episodes of bloody bowel movement.  She tells me that she has had at least 10+ bowel movements overnight and even while she has been in the ED.  Her last colonoscopy was in 2019 which was overall unremarkable. She denies any fevers and chills.  In the ED she was noted to have leukocytosis.  CT of the abdomen pelvis showed concerns for colonic thickening in the transverse and descending colon suggestive of possible ischemic versus infectious colitis.  Had elevated lactic acid levels.  She was seen by general surgery who recommended conservative management.  GI was also consulted.  When I saw the patient she was hemodynamically stable but just had a bowel movement with some blood present in it.  CODE STATUS-full Social history-denies any alcohol, tobacco and illicit drug use   Review of Systems: As per HPI otherwise 10 point review of systems negative.  Review of Systems Otherwise negative except as per HPI, including: General: Denies fever, chills, night sweats or unintended weight loss. Resp: Denies cough, wheezing, shortness of breath. Cardiac: Denies chest pain, palpitations, orthopnea, paroxysmal nocturnal dyspnea. GI: Denies constipation GU: Denies dysuria, frequency, hesitancy or incontinence MS: Denies muscle aches, joint pain or swelling Neuro: Denies headache, neurologic deficits (focal weakness,  numbness, tingling), abnormal gait Psych: Denies anxiety, depression, SI/HI/AVH Skin: Denies new rashes or lesions ID: Denies sick contacts, exotic exposures, travel  Past Medical History:  Diagnosis Date   Anxiety    Asthma    Chronic pain    Depression    Diabetes mellitus without complication (HCC)    GERD (gastroesophageal reflux disease)    Headache    migraines   Hyperlipidemia    Hypertension    Hypothyroidism    Osteoarthritis    PONV (postoperative nausea and vomiting)     Past Surgical History:  Procedure Laterality Date   ABDOMINAL HYSTERECTOMY     ANKLE RECONSTRUCTION Left    1977   APPENDECTOMY     CARPAL TUNNEL RELEASE Right    2010   catheter ablation     1993   CESAREAN SECTION     x2   CHOLECYSTECTOMY     HERNIA REPAIR     KNEE ARTHROSCOPY W/ MENISCECTOMY Right    2008   KNEE DISLOCATION SURGERY Right    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     2019   TOTAL HIP ARTHROPLASTY Right 04/12/2021   Procedure: TOTAL HIP ARTHROPLASTY ANTERIOR APPROACH;  Surgeon: Ollen Gross, MD;  Location: [facility];  Service: Orthopedics;  Laterality: Right;   vaginal fistula of sigmoid colon      SOCIAL HISTORY:  reports that she has never smoked. She has never been exposed to tobacco smoke. She has never used smokeless tobacco. She reports that she does not drink alcohol and does not use drugs.  Allergies  Allergen Reactions   Hydromorphone Shortness Of Breath    Respiratory arrest per patient Other reaction(s): coded with fast admin Note: Imported from external source.  Penicillins Anaphylaxis   Erythromycin Base Nausea And Vomiting   Ibuprofen Other (See Comments)    Per pt, it makes her bleed   Liraglutide    Meperidine Hives   Nsaids     Other reaction(s): GI bleed Note: Imported from external source.    FAMILY HISTORY: History reviewed. No pertinent family history.   Prior to Admission medications   Medication Sig Start Date End Date Taking? Authorizing Provider   buPROPion (WELLBUTRIN SR) 150 MG 12 hr tablet Take 150 mg by mouth 2 (two) times daily.   Yes [provider]  busPIRone (BUSPAR) 10 MG tablet Take 15 mg by mouth 2 (two) times daily.   Yes [provider]  Cholecalciferol 25 MCG (1000 UT) tablet Take 1,000 Units by mouth daily.   Yes [provider]  COLLAGEN PO Take 6,000 mg by mouth daily.   Yes [provider]  cyanocobalamin 2000 MCG tablet Take 2,000 mcg by mouth daily.   Yes [provider]  estradiol (ESTRACE) 0.5 MG tablet Take 0.5 mg by mouth daily.   Yes [provider]  ferrous gluconate (FERGON) 324 MG tablet Take 324 mg by mouth daily with breakfast.   Yes [provider]  fluconazole (DIFLUCAN) 150 MG tablet Take 150 mg by mouth once a week. 10/17/21  Yes [provider]  FLUoxetine (PROZAC) 40 MG capsule Take 1 capsule (40 mg total) by mouth daily. 11/29/21  Yes Mozingo, Thereasa Solo, NP  furosemide (LASIX) 20 MG tablet Take 20 mg by mouth every Monday, Wednesday, and Friday.   Yes [provider]  gabapentin (NEURONTIN) 600 MG tablet Take 600 mg by mouth in the morning, at noon, in the evening, and at bedtime.   Yes [provider]  hydrOXYzine (ATARAX) 25 MG tablet Take 25 mg by mouth 3 (three) times daily. 12/15/21  Yes [provider]  insulin aspart (NOVOLOG) 100 UNIT/ML injection Inject 5-8 Units into the skin 3 (three) times daily before meals.   Yes [provider]  Insulin Glargine-Lixisenatide (SOLIQUA) 100-33 UNT-MCG/ML SOPN Inject 32 Units into the skin at bedtime. 10/26/21  Yes [provider]  levocetirizine (XYZAL) 5 MG tablet Take 5 mg by mouth every evening.   Yes [provider]  levothyroxine (SYNTHROID) 175 MCG tablet Take 175 mcg by mouth daily before breakfast.   Yes [provider]  LORazepam (ATIVAN) 0.5 MG tablet Take 1 tablet (0.5 mg total) by mouth 2 (two) times daily. 10/28/21   Yes Mozingo, Thereasa Solo, NP  lovastatin (MEVACOR) 40 MG tablet Take 40 mg by mouth at bedtime.   Yes [provider]  meclizine (ANTIVERT) 25 MG tablet Take 25 mg by mouth 3 (three) times daily as needed for dizziness.   Yes [provider]  metFORMIN (GLUCOPHAGE) 500 MG tablet Take 500 mg by mouth daily as needed (sugar).   Yes [provider]  montelukast (SINGULAIR) 10 MG tablet Take 10 mg by mouth daily.   Yes [provider]  ondansetron (ZOFRAN) 4 MG tablet Take 4 mg by mouth 3 (three) times daily as needed for refractory nausea / vomiting.   Yes [provider]  oxybutynin (DITROPAN) 5 MG tablet Take 5 mg by mouth daily. 12/15/21  Yes [provider]  oxyCODONE (OXY IR/ROXICODONE) 5 MG immediate release tablet Take 1-2 tablets (5-10 mg total) by mouth every 6 (six) hours as needed for breakthrough pain. Patient taking differently: Take 15 mg by mouth every  6 (six) hours as needed for breakthrough pain. 04/13/21  Yes Edmisten, Kristie L, PA  pantoprazole (PROTONIX) 40 MG tablet Take 40 mg by mouth daily. 10/13/21  Yes [provider]  rizatriptan (MAXALT) 10 MG tablet Take 10 mg by mouth every 6 (six) hours as needed for migraine. May repeat in 2 hours if needed   Yes [provider]  tiZANidine (ZANAFLEX) 4 MG capsule Take 4 mg by mouth in the morning and at bedtime.   Yes [provider]  traZODone (DESYREL) 50 MG tablet Take 50 mg by mouth at bedtime. 10/21/21  Yes [provider]  valACYclovir (VALTREX) 500 MG tablet Take 500 mg by mouth 2 (two) times daily as needed (shingles).   Yes [provider]  zinc gluconate 50 MG tablet Take 50 mg by mouth daily.   Yes [provider]    Physical Exam: Vitals:   12/22/21 0900 12/22/21 0930 12/22/21 1030 12/22/21 1200  BP: (!) 152/70 (!) 151/86 (!) 126/103 (!) 164/71  Pulse: 83 79 81 80  Resp: 17 17 18 18   Temp:      TempSrc:       SpO2: 94% 99% 96% 97%  Weight:      Height:          Constitutional: NAD, calm, comfortable Eyes: PERRL, lids and conjunctivae normal ENMT: Mucous membranes are moist. Posterior pharynx clear of any exudate or lesions.Normal dentition.  Neck: normal, supple, no masses, no thyromegaly Respiratory: clear to auscultation bilaterally, no wheezing, no crackles. Normal respiratory effort. No accessory muscle use.  Cardiovascular: Regular rate and rhythm, no murmurs / rubs / gallops. No extremity edema. 2+ pedal pulses. No carotid bruits.  Abdomen: no tenderness, no masses palpated. No hepatosplenomegaly. Bowel sounds positive.  Musculoskeletal: no clubbing / cyanosis. No joint deformity upper and lower extremities. Good ROM, no contractures. Normal muscle tone.  Skin: no rashes, lesions, ulcers. No induration Neurologic: CN 2-12 grossly intact. Sensation intact, DTR normal. Strength 5/5 in all 4.  Psychiatric: Normal judgment and insight. Alert and oriented x 3. Normal mood.     Labs on Admission: I have personally reviewed following labs and imaging studies  CBC: Recent Labs  Lab 12/22/21 0800 12/22/21 0805  WBC 13.7*  --   NEUTROABS 11.2*  --   HGB 15.2* 15.6*  HCT 45.4 46.0  MCV 88.8  --   PLT 204  --    Basic Metabolic Panel: Recent Labs  Lab 12/22/21 0800 12/22/21 0805  NA 137 139  K 3.2* 3.3*  CL 101 99  CO2 26  --   GLUCOSE 245* 244*  BUN 18 17  CREATININE 0.82 0.60  CALCIUM 8.7*  --    GFR: Estimated Creatinine Clearance: 79.4 mL/min (by C-G formula based on SCr of 0.6 mg/dL). Liver Function Tests: Recent Labs  Lab 12/22/21 0800  AST 20  ALT 18  ALKPHOS 64  BILITOT 0.6  PROT 6.3*  ALBUMIN 3.3*   Recent Labs  Lab 12/22/21 0800  LIPASE 36   No results for input(s): "AMMONIA" in the last 168 hours. Coagulation Profile: Recent Labs  Lab 12/22/21 0800  INR 1.1   Cardiac Enzymes: No results for input(s): "CKTOTAL", "CKMB", "CKMBINDEX",  "TROPONINI" in the last 168 hours. BNP (last 3 results) No results for input(s): "PROBNP" in the last 8760 hours. HbA1C: No results for input(s): "HGBA1C" in the last 72 hours. CBG: No results for input(s): "GLUCAP" in the last 168 hours. Lipid Profile:  No results for input(s): "CHOL", "HDL", "LDLCALC", "TRIG", "CHOLHDL", "LDLDIRECT" in the last 72 hours. Thyroid Function Tests: No results for input(s): "TSH", "T4TOTAL", "FREET4", "T3FREE", "THYROIDAB" in the last 72 hours. Anemia Panel: No results for input(s): "VITAMINB12", "FOLATE", "FERRITIN", "TIBC", "IRON", "RETICCTPCT" in the last 72 hours. Urine analysis: No results found for: "COLORURINE", "APPEARANCEUR", "LABSPEC", "PHURINE", "GLUCOSEU", "HGBUR", "BILIRUBINUR", "KETONESUR", "PROTEINUR", "UROBILINOGEN", "NITRITE", "LEUKOCYTESUR" Sepsis Labs: !!!!!!!!!!!!!!!!!!!!!!!!!!!!!!!!!!!!!!!!!!!! @LABRCNTIP (procalcitonin:4,lacticidven:4) )No results found for this or any previous visit (from the past 240 hour(s)).   Radiological Exams on Admission: CT Angio Abd/Pel W and/or Wo Contrast  Result Date: 12/22/2021 CLINICAL DATA:  GI bleed, lower Abdomen pain and n/v with blood x 1 day. EXAM: CTA ABDOMEN AND PELVIS WITHOUT AND WITH CONTRAST TECHNIQUE: Multidetector CT imaging of the abdomen and pelvis was performed using the standard protocol during bolus administration of intravenous contrast. Multiplanar reconstructed images and MIPs were obtained and reviewed to evaluate the vascular anatomy. RADIATION DOSE REDUCTION: This exam was performed according to the departmental dose-optimization program which includes automated exposure control, adjustment of the mA and/or kV according to patient size and/or use of iterative reconstruction technique. CONTRAST:  02/22/2022 OMNIPAQUE IOHEXOL 350 MG/ML SOLN COMPARISON:  CT AP, 03/21/2014 (report) and 02/23/2007. KUB, 10/30/2020. FINDINGS: VASCULAR Aorta: Normal caliber aorta without aneurysm, dissection,  vasculitis or significant stenosis. Celiac: Widely patent without evidence of aneurysm, dissection, vasculitis or significant stenosis. SMA: Widely patent without evidence of aneurysm, dissection, vasculitis or significant stenosis. Renals: A single LEFT and 2 RIGHT renal arteries are present. The renal arteries are widely patent without evidence of aneurysm, dissection, vasculitis, fibromuscular dysplasia or significant stenosis. IMA: Widely patent without evidence of aneurysm, dissection, vasculitis or significant stenosis. Distal IMA territory branches are opacified, to the technical extent of this evaluation. Pelvis: Mild aortic bifurcation and iliac artery atherosclerosis, otherwise patent without evidence of aneurysm, dissection, vasculitis or significant stenosis. Proximal Outflow: Bilateral common femoral and visualized portions of the superficial and profunda femoral arteries are patent without evidence of aneurysm, dissection, vasculitis or significant stenosis. Veins: No obvious venous abnormality within the limitations of this arterial phase study. Review of the MIP images confirms the above findings. NON-VASCULAR Lower chest: No acute abnormality. Hepatobiliary: No focal liver abnormality is seen. Status post cholecystectomy. No biliary dilatation. Pancreas: No pancreatic ductal dilatation or surrounding inflammatory changes. Spleen: Normal in size without focal abnormality. Adrenals/Urinary Tract: Adrenal glands are unremarkable. Kidneys are normal, without renal calculi, focal lesion, or hydronephrosis. Bladder is unremarkable. Stomach/Bowel: *Stomach is within normal limits. Appendix is not definitively visualized, likely surgically absent. *Moderate amount of colonic mucosal thickening and pericolonic stranding involving the mid to distal transverse colon and extending to the mid to distal descending colon, in a IMA vascular territorial distribution *Postsurgical changes of sigmoidectomy with intact  anastomosis. *Mild air and fluid distention colon proximal to the anastomosis without overt colonic obstruction. Lymphatic: No enlarged abdominal or pelvic lymph nodes. Reproductive: Status post hysterectomy. No adnexal masses. Other: No abdominal wall hernia or abnormality. No abdominopelvic ascites. Musculoskeletal: RIGHT hip arthroplasty with proud acetabular fixation screw extending into the RIGHT iliacus. Degenerative changes of the spine. No acute osseous findings. IMPRESSION: VASCULAR 1. No acute vascular abnormality within the abdomen or pelvis or evidence of acute GI bleed. 2. Distal IMA territory branches are opacified, to the technical extent of this evaluation. NON-VASCULAR 1. Colonic mucosal thickening and pericolonic stranding involving the distal transverse to descending colon. Differential diagnosis includes mesenteric ischemia versus colitis, more likely. 2. Postsurgical changes of  sigmoidectomy with minimal colonic distention proximal to the anastomosis. No overt colonic obstruction. Additional incidental, chronic and senescent findings as above. Electronically Signed   By: Roanna Banning M.D.   On: 12/22/2021 09:26      Assessment/Plan Principal Problem:   Rectal bleed Active Problems:   Hypokalemia   Hypocalcemia   Colitis   HTN (hypertension)   HLD (hyperlipidemia)   DM2 (diabetes mellitus, type 2) (HCC)   Iron deficiency anemia   Depression    Rectal bleed, lower GI bleed Colitis transverse and descending colon - There is suspicion for ischemic versus infectious colitis.  We will start empiric IV Cipro and Flagyl.  Continue n.p.o., aggressive IV fluids.  Accu-Cheks every 6 hours.  Seen by general surgery.  GI consulted.  Stool studies have been ordered. -Currently hemoglobin remained stable.  Pain control.  Hypokalemia/hypoCalcemia - Repletion ordered.  Check magnesium and phosphorus levels.  Essential hypertension -Currently home meds on hold.  IV as needed Lopressor  and hydralazine ordered  Diabetes mellitus type 2 -Home metformin on hold.  Lactic acid slightly elevated.  As oral tolerance improves this can be slowly resumed.  In the meantime sliding scale and Accu-Cheks  Hyperlipidemia -Statin  Hypothyroidism -On home Synthroid currently on hold.  Depression -Home meds on hold  Iron deficiency anemia -Home meds on hold    DVT prophylaxis: SCDs Code Status: Full code Family Communication: None Consults called: GI and general surgery Admission status: Admit to MedSurg  Status is: Observation The patient remains OBS appropriate and will d/c before 2 midnights.   Time Spent: 65 minutes.  >50% of the time was devoted to discussing the patients care, assessment, plan and disposition with other care givers along with counseling the patient about the risks and benefits of treatment.    Saya Mccoll Joline Maxcy MD [facility]  If 7PM-7AM, please contact night-coverage   12/22/2021, 12:44 PM

## 2021-12-22 NOTE — ED Notes (Signed)
Helped pt to bedside commode ,wiped pts bottom and helped her back to bed and hooked monitor

## 2021-12-22 NOTE — Consult Note (Signed)
Referring Provider: No ref. provider found Primary Care Physician:  Waldron Session, FNP Primary Gastroenterologist:  [facility]  Date of Admission: 12/22/21  Date of Consultation: 12/22/21  Reason for Consultation:  hematochezia, nausea, vomiting   HPI:  [name] is a 66 y.o. year old female with pmh of GERD, DM, Anxiety/depression, Chronic pain, asthma, HLD, HTN, IBS, and history of vaginal fistula of sigmoid colon, who presented to the ED today with abdominal pain and rectal bleeding. Symptom began last night with generalized abdominal pain, nausea, vomiting and BRBPR, at approximately 10pm. Patient reportedly having large volume bloody stools with pain into her rectum. No current anticoagulation therapy. She is established with [facility].  ED Course: Hemodynamically stable with BP 151/88, HR 79, afebrile, O2 sat WNL on room air FOBT Positive  Hgb 15.2, WBC 13.7, K+3.2, Lactic acid 2.1, BUN 18 INR 1.1  -CT Angio A/P with no acute vascular abnormalities within abd/pelvis,or evidence of acute GIB. Distal IMA territory branches are opacified, though with Colonic mucosal thickening and pericolonic stranding of distal transverse to descending colon. Differential diagnosis includes mesenteric ischemia versus colitis, more likely. Postsurgical changes of sigmoidectomy with minimal colonic distention proximal to the anastomosis. No overt colonic obstruction.   Consult:  Patient reports sudden onset of rectal bleeding and RLQ abdominal cramping around 2130 last night. Reportedly she had sat down for dinner and began feeling nauseated. She got up and went to the restroom where she had loose stools with presence of mucus and blood in the toilet, described as more maroon in color. She continues to have some rectal bleeding that is darker red in color. Did have a few episodes of vomiting, reportedly with nausea occurring during her BMs. At baseline she takes a stool softener daily and typically  moves her bowels every day to every other day. She had previously had a normal BM earlier in the day yesterday. Abdominal pain remains about the same. Stools remain looser to watery, has had 6-7 BMs while in the ED. Denies fevers or chills. No recent antibiotics, travel or recent sick exposures. Denies any previous history of GI bleeding.   Last colonoscopy was in 2019, though unable to see entire report, findings as outlined below. She is unsure why she had colonic biopsies done at that time. She reports hx of IBS, though typically has more constipation. Has abdominal cramping intermittently at baseline. She is not currently on anti smasmodics for this, previously on bentyl without good results.   She denies any changes in appetite or weight loss prior to acute illness. Felt to be in her normal state of health until last night.   Recently saw her Primary GI, she has been having a lot of issues with acid regurgitation, is maintained on protonix 40mg  in the morning and was recently added famotidine 20mg  in the evening, though she has not had much result from initiation of H2B. Regurgitation has been more frequent recently with the need to get up in the night and sit in her chair. Denies issues with dysphagia or odynophagia.   She denies NSAID use. No alcohol or tobacco No significant family GI history  Last 20-25 years ago, per patient.  Last Colonoscopy: 09/05/17 transverse colon polyp-serrated, no dysplasia, random colonic biopsies with slight hemorrhage and suggestive of melanosis coli, sigmoid polyp-serrated  Past Medical History:  Diagnosis Date   Anxiety    Asthma    Chronic pain    Depression    Diabetes mellitus without  complication (HCC)    GERD (gastroesophageal reflux disease)    Headache    migraines   Hyperlipidemia    Hypertension    Hypothyroidism    Osteoarthritis    PONV (postoperative nausea and vomiting)     Past Surgical History:  Procedure Laterality Date    ABDOMINAL HYSTERECTOMY     ANKLE RECONSTRUCTION Left    1977   APPENDECTOMY     CARPAL TUNNEL RELEASE Right    2010   catheter ablation     [place]     x2   CHOLECYSTECTOMY     HERNIA REPAIR     KNEE ARTHROSCOPY W/ MENISCECTOMY Right    2008   KNEE DISLOCATION SURGERY Right    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     2019   TOTAL HIP ARTHROPLASTY Right 04/12/2021   Procedure: TOTAL HIP ARTHROPLASTY ANTERIOR APPROACH;  Surgeon: Gaynelle Arabian, MD;  Location: [facility];  Service: Orthopedics;  Laterality: Right;   vaginal fistula of sigmoid colon      Prior to Admission medications   Medication Sig Start Date End Date Taking? Authorizing Provider  buPROPion (WELLBUTRIN SR) 150 MG 12 hr tablet Take 150 mg by mouth 2 (two) times daily.   Yes [provider]  busPIRone (BUSPAR) 10 MG tablet Take 10 mg by mouth 2 (two) times daily.   Yes [provider]  Cholecalciferol 25 MCG (1000 UT) tablet Take by mouth.   Yes [provider]  COLLAGEN PO Take 6,000 mg by mouth daily.   Yes [provider]  cyanocobalamin 2000 MCG tablet Take 2,000 mcg by mouth daily.   Yes [provider]  estradiol (ESTRACE) 0.5 MG tablet Take 0.5 mg by mouth daily.   Yes [provider]  ferrous gluconate (FERGON) 324 MG tablet Take 324 mg by mouth daily with breakfast.   Yes [provider]  FLUoxetine (PROZAC) 40 MG capsule Take 1 capsule (40 mg total) by mouth daily. 11/29/21  Yes Mozingo, Berdie Ogren, NP  furosemide (LASIX) 20 MG tablet Take 20 mg by mouth every Monday, Wednesday, and Friday.   Yes [provider]  gabapentin (NEURONTIN) 600 MG tablet Take 600 mg by mouth in the morning, at noon, in the evening, and at bedtime.   Yes [provider]  insulin aspart (NOVOLOG) 100 UNIT/ML injection Inject 5-8 Units into the skin 3 (three) times daily before meals.   Yes [provider]  insulin glargine (LANTUS) 100 UNIT/ML  injection Inject 32 Units into the skin at bedtime.   Yes [provider]  levocetirizine (XYZAL) 5 MG tablet Take 5 mg by mouth every evening.   Yes [provider]  levothyroxine (SYNTHROID) 175 MCG tablet Take 175 mcg by mouth daily before breakfast.   Yes [provider]  LORazepam (ATIVAN) 0.5 MG tablet Take 1 tablet (0.5 mg total) by mouth 2 (two) times daily. 10/28/21  Yes Mozingo, Berdie Ogren, NP  lovastatin (MEVACOR) 40 MG tablet Take 40 mg by mouth at bedtime.   Yes [provider]  meclizine (ANTIVERT) 25 MG tablet Take 25 mg by mouth 3 (three) times daily as needed for dizziness.   Yes [provider]  metFORMIN (GLUCOPHAGE) 500 MG tablet Take 500 mg by mouth daily.   Yes [provider]  montelukast (SINGULAIR) 10 MG tablet Take 10 mg by mouth daily.   Yes [provider]  ondansetron (ZOFRAN) 4 MG tablet Take 4  mg by mouth 3 (three) times daily as needed for refractory nausea / vomiting.   Yes [provider]  oxyCODONE (OXY IR/ROXICODONE) 5 MG immediate release tablet Take 1-2 tablets (5-10 mg total) by mouth every 6 (six) hours as needed for breakthrough pain. Patient taking differently: Take 15 mg by mouth every 6 (six) hours as needed for breakthrough pain. 04/13/21  Yes Edmisten, Kristie L, PA  rizatriptan (MAXALT) 10 MG tablet Take 10 mg by mouth every 6 (six) hours as needed for migraine. May repeat in 2 hours if needed   Yes [provider]  tiZANidine (ZANAFLEX) 4 MG capsule Take 4 mg by mouth in the morning and at bedtime.   Yes [provider]  valACYclovir (VALTREX) 500 MG tablet Take 500 mg by mouth 2 (two) times daily as needed (shingles).   Yes [provider]  zinc gluconate 50 MG tablet Take 50 mg by mouth daily.   Yes [provider]  vitamin B-12 (CYANOCOBALAMIN) 500 MCG tablet Take 500 mcg by mouth daily.    [provider]    Current  Facility-Administered Medications  Medication Dose Route Frequency Provider Last Rate Last Admin   ciprofloxacin (CIPRO) IVPB 400 mg  400 mg Intravenous Q12H Godfrey Pick, MD       fentaNYL (SUBLIMAZE) injection 100 mcg  100 mcg Intravenous Once Godfrey Pick, MD       lactated ringers bolus 1,000 mL  1,000 mL Intravenous Once Godfrey Pick, MD       lactated ringers infusion   Intravenous Continuous Godfrey Pick, MD 125 mL/hr at 12/22/21 0923 New Bag at 12/22/21 0923   metoCLOPramide (REGLAN) injection 10 mg  10 mg Intravenous Once Godfrey Pick, MD       metroNIDAZOLE (FLAGYL) IVPB 500 mg  500 mg Intravenous Q8H Godfrey Pick, MD       potassium chloride 10 mEq in 100 mL IVPB  10 mEq Intravenous Q1 Hr x 3 Godfrey Pick, MD       Current Outpatient Medications  Medication Sig Dispense Refill   buPROPion (WELLBUTRIN SR) 150 MG 12 hr tablet Take 150 mg by mouth 2 (two) times daily.     busPIRone (BUSPAR) 10 MG tablet Take 10 mg by mouth 2 (two) times daily.     Cholecalciferol 25 MCG (1000 UT) tablet Take by mouth.     COLLAGEN PO Take 6,000 mg by mouth daily.     cyanocobalamin 2000 MCG tablet Take 2,000 mcg by mouth daily.     estradiol (ESTRACE) 0.5 MG tablet Take 0.5 mg by mouth daily.     ferrous gluconate (FERGON) 324 MG tablet Take 324 mg by mouth daily with breakfast.     FLUoxetine (PROZAC) 40 MG capsule Take 1 capsule (40 mg total) by mouth daily. 30 capsule 2   furosemide (LASIX) 20 MG tablet Take 20 mg by mouth every Monday, Wednesday, and Friday.     gabapentin (NEURONTIN) 600 MG tablet Take 600 mg by mouth in the morning, at noon, in the evening, and at bedtime.     insulin aspart (NOVOLOG) 100 UNIT/ML injection Inject 5-8 Units into the skin 3 (three) times daily before meals.     insulin glargine (LANTUS) 100 UNIT/ML injection Inject 32 Units into the skin at bedtime.     levocetirizine (XYZAL) 5 MG tablet Take 5 mg by mouth every evening.     levothyroxine (SYNTHROID) 175 MCG tablet Take  175 mcg by mouth daily before  breakfast.     LORazepam (ATIVAN) 0.5 MG tablet Take 1 tablet (0.5 mg total) by mouth 2 (two) times daily. 60 tablet 2   lovastatin (MEVACOR) 40 MG tablet Take 40 mg by mouth at bedtime.     meclizine (ANTIVERT) 25 MG tablet Take 25 mg by mouth 3 (three) times daily as needed for dizziness.     metFORMIN (GLUCOPHAGE) 500 MG tablet Take 500 mg by mouth daily.     montelukast (SINGULAIR) 10 MG tablet Take 10 mg by mouth daily.     ondansetron (ZOFRAN) 4 MG tablet Take 4 mg by mouth 3 (three) times daily as needed for refractory nausea / vomiting.     oxyCODONE (OXY IR/ROXICODONE) 5 MG immediate release tablet Take 1-2 tablets (5-10 mg total) by mouth every 6 (six) hours as needed for breakthrough pain. (Patient taking differently: Take 15 mg by mouth every 6 (six) hours as needed for breakthrough pain.) 28 tablet 0   rizatriptan (MAXALT) 10 MG tablet Take 10 mg by mouth every 6 (six) hours as needed for migraine. May repeat in 2 hours if needed     tiZANidine (ZANAFLEX) 4 MG capsule Take 4 mg by mouth in the morning and at bedtime.     valACYclovir (VALTREX) 500 MG tablet Take 500 mg by mouth 2 (two) times daily as needed (shingles).     zinc gluconate 50 MG tablet Take 50 mg by mouth daily.     vitamin B-12 (CYANOCOBALAMIN) 500 MCG tablet Take 500 mcg by mouth daily.      Allergies as of 12/22/2021 - Review Complete 12/22/2021  Allergen Reaction Noted   Hydromorphone Shortness Of Breath 04/07/2012   Penicillins Anaphylaxis 04/07/2012   Erythromycin base Nausea And Vomiting 04/07/2012   Ibuprofen Other (See Comments) 12/21/2021   Liraglutide  10/28/2021   Meperidine Hives 04/07/2012   Nsaids  10/28/2021    History reviewed. No pertinent family history.  Social History   Socioeconomic History   Marital status: Married    Spouse name: Not on file   Number of children: Not on file   Years of education: Not on file   Highest education level: Not on file   Occupational History   Not on file  Tobacco Use   Smoking status: Never    Passive exposure: Never   Smokeless tobacco: Never  Vaping Use   Vaping Use: Never used  Substance and Sexual Activity   Alcohol use: Never   Drug use: Never   Sexual activity: Not Currently    Birth control/protection: None  Other Topics Concern   Not on file  Social History Narrative   Not on file   Social Determinants of Health   Financial Resource Strain: Not on file  Food Insecurity: Not on file  Transportation Needs: Not on file  Physical Activity: Not on file  Stress: Not on file  Social Connections: Not on file  Intimate Partner Violence: Not on file   Review of Systems: Gen: Denies fever, chills, loss of appetite, change in weight or weight loss CV: Denies chest pain, heart palpitations, syncope, edema  Resp: Denies shortness of breath with rest, cough, wheezing GI: denies melena, vomiting, diarrhea, constipation, dysphagia, odyonophagia, early satiety or weight loss. +hematochezia +diarrhea +abdominal pain +nausea GU : Denies urinary burning, urinary frequency, urinary incontinence.  MS: Denies joint pain,swelling, cramping Derm: Denies rash, itching, dry skin Psych: Denies depression, anxiety,confusion, or memory loss Heme: Denies bruising, bleeding, and enlarged lymph nodes.  Physical  Exam: Vital signs in last 24 hours: Temp:  [98.4 F (36.9 C)] 98.4 F (36.9 C) (07/12 0721) Pulse Rate:  [76-87] 79 (07/12 0930) Resp:  [11-22] 17 (07/12 0930) BP: (130-153)/(60-86) 151/86 (07/12 0930) SpO2:  [94 %-100 %] 99 % (07/12 0930) Weight:  [96.1 kg] 96.1 kg (07/12 0726)   General:   Alert,  Well-developed, well-nourished, pleasant and cooperative in NAD Head:  Normocephalic and atraumatic. Eyes:  Sclera clear, no icterus.   Conjunctiva pink. Ears:  Normal auditory acuity. Nose:  No deformity, discharge,  or lesions. Mouth:  No deformity or lesions, dentition normal. Lungs:  Clear  throughout to auscultation.   No wheezes, crackles, or rhonchi. No acute distress. Heart:  Regular rate and rhythm; no murmurs, clicks, rubs,  or gallops. Abdomen:  Soft, and nondistended. Diffuse TTP of abdomen, though worse in LUQ.No masses, hepatosplenomegaly or hernias noted. Normal bowel sounds, without guarding, and without rebound.   Rectal:  Deferred until time of colonoscopy.   Msk:  Symmetrical without gross deformities. Normal posture. Pulses:  Normal pulses noted. Extremities:  Without clubbing or edema. Neurologic:  Alert and  oriented x4;  grossly normal neurologically. Skin:  Intact without significant lesions or rashes. Psych:  Alert and cooperative. Normal mood and affect.  Intake/Output from previous day: No intake/output data recorded. Intake/Output this shift: Total I/O In: 500 [IV Piggyback:500] Out: -   Lab Results: Recent Labs    12/22/21 0800 12/22/21 0805  WBC 13.7*  --   HGB 15.2* 15.6*  HCT 45.4 46.0  PLT 204  --    BMET Recent Labs    12/22/21 0800 12/22/21 0805  NA 137 139  K 3.2* 3.3*  CL 101 99  CO2 26  --   GLUCOSE 245* 244*  BUN 18 17  CREATININE 0.82 0.60  CALCIUM 8.7*  --    LFT Recent Labs    12/22/21 0800  PROT 6.3*  ALBUMIN 3.3*  AST 20  ALT 18  ALKPHOS 64  BILITOT 0.6   PT/INR Recent Labs    12/22/21 0800  LABPROT 14.0  INR 1.1   Hepatitis Panel No results for input(s): "HEPBSAG", "HCVAB", "HEPAIGM", "HEPBIGM" in the last 72 hours. C-Diff No results for input(s): "CDIFFTOX" in the last 72 hours.  Studies/Results: CT Angio Abd/Pel W and/or Wo Contrast  Result Date: 12/22/2021 CLINICAL DATA:  GI bleed, lower Abdomen pain and n/v with blood x 1 day. EXAM: CTA ABDOMEN AND PELVIS WITHOUT AND WITH CONTRAST TECHNIQUE: Multidetector CT imaging of the abdomen and pelvis was performed using the standard protocol during bolus administration of intravenous contrast. Multiplanar reconstructed images and MIPs were obtained  and reviewed to evaluate the vascular anatomy. RADIATION DOSE REDUCTION: This exam was performed according to the departmental dose-optimization program which includes automated exposure control, adjustment of the mA and/or kV according to patient size and/or use of iterative reconstruction technique. CONTRAST:  OMNIPAQUE IOHEXOL 350 MG/ML SOLN COMPARISON:  CT AP, 03/21/2014 (report) and 02/23/2007. KUB, 10/30/2020. FINDINGS: VASCULAR Aorta: Normal caliber aorta without aneurysm, dissection, vasculitis or significant stenosis. Celiac: Widely patent without evidence of aneurysm, dissection, vasculitis or significant stenosis. SMA: Widely patent without evidence of aneurysm, dissection, vasculitis or significant stenosis. Renals: A single LEFT and 2 RIGHT renal arteries are present. The renal arteries are widely patent without evidence of aneurysm, dissection, vasculitis, fibromuscular dysplasia or significant stenosis. IMA: Widely patent without evidence of aneurysm, dissection, vasculitis or significant stenosis. Distal IMA territory branches are opacified, to  the technical extent of this evaluation. Pelvis: Mild aortic bifurcation and iliac artery atherosclerosis, otherwise patent without evidence of aneurysm, dissection, vasculitis or significant stenosis. Proximal Outflow: Bilateral common femoral and visualized portions of the superficial and profunda femoral arteries are patent without evidence of aneurysm, dissection, vasculitis or significant stenosis. Veins: No obvious venous abnormality within the limitations of this arterial phase study. Review of the MIP images confirms the above findings. NON-VASCULAR Lower chest: No acute abnormality. Hepatobiliary: No focal liver abnormality is seen. Status post cholecystectomy. No biliary dilatation. Pancreas: No pancreatic ductal dilatation or surrounding inflammatory changes. Spleen: Normal in size without focal abnormality. Adrenals/Urinary Tract: Adrenal  glands are unremarkable. Kidneys are normal, without renal calculi, focal lesion, or hydronephrosis. Bladder is unremarkable. Stomach/Bowel: *Stomach is within normal limits. Appendix is not definitively visualized, likely surgically absent. *Moderate amount of colonic mucosal thickening and pericolonic stranding involving the mid to distal transverse colon and extending to the mid to distal descending colon, in a IMA vascular territorial distribution *Postsurgical changes of sigmoidectomy with intact anastomosis. *Mild air and fluid distention colon proximal to the anastomosis without overt colonic obstruction. Lymphatic: No enlarged abdominal or pelvic lymph nodes. Reproductive: Status post hysterectomy. No adnexal masses. Other: No abdominal wall hernia or abnormality. No abdominopelvic ascites. Musculoskeletal: RIGHT hip arthroplasty with proud acetabular fixation screw extending into the RIGHT iliacus. Degenerative changes of the spine. No acute osseous findings. IMPRESSION: VASCULAR 1. No acute vascular abnormality within the abdomen or pelvis or evidence of acute GI bleed. 2. Distal IMA territory branches are opacified, to the technical extent of this evaluation. NON-VASCULAR 1. Colonic mucosal thickening and pericolonic stranding involving the distal transverse to descending colon. Differential diagnosis includes mesenteric ischemia versus colitis, more likely. 2. Postsurgical changes of sigmoidectomy with minimal colonic distention proximal to the anastomosis. No overt colonic obstruction. Additional incidental, chronic and senescent findings as above. Electronically Signed   By: Roanna BanningJon  Mugweru M.D.   On: 12/22/2021 09:26    Impression: [name] is a 66 y.o. year old female with pmh of GERD, DM, Anxiety/depression, Chronic pain, asthma, HLD, HTN, and history of vaginal fistula of sigmoid colon, who presented to the ED today with abdominal pain, nausea, vomiting and hematochezia, onset 10pm last  night. CT Angio concerning for possible mesenteric ischemia vs. Colitis. GI consulted for further evaluation.  GI bleeding/colonic mucosal thickening:  sudden onset of abdominal pain, nausea, loose stools and rectal bleeding,darker red to maroon in color. CT Angio A/P with Colonic mucosal thickening and pericolonic stranding involving the distal transverse to descending colon. Patient continue with loose to watery stools, 6-7 this morning. Denies fevers or chills. Reports nausea seems to occur with BMs. No recent antibiotic therapy, travel or sick contacts, does not use NSAIDs. Last Colonoscopy in 2019, unable to view entire report but appears she had a couple of serrated polyps and melanosis coli, colonic biopsies at that time were negative. Cipro and flagyl have been initiated, will check stool studies to rule out infectious etiology though much less likely given larger volume of hematochezia and localized colonic thickening. Suspect ischemic colitis, especially in setting of multiple home medications that can precipitate this along with clinical presentation. Given hemoglobin remains stable, will continue to trend for significant drops in hgb or worsening rectal bleeding, though will likely hold off on colonoscopy at this time and recommend follow up endoscopic evaluation as outpatient with her primary GI.  Plan: Stool studies Can Continue with empiric antibiotics Plan for outpatient colonoscopy  with Primary GI unless significant drop in hgb/worsening rectal bleeding Trend H&H, transfuse hgb <7 PPI daily Maintain BP WNL, avoid hypotension Pain/nausea management per hospitalist Continue with supportive measures  Trial clear liquids, consider advancing to low residue diet tomorrow if doing well on clears   LOS: 0 days    12/22/2021, 10:04 AM   Annaka Cleaver L. Alver Sorrow, MSN, APRN, AGNP-C Adult-Gerontology Nurse Practitioner [facility]

## 2021-12-22 NOTE — Consult Note (Addendum)
[facility] Consult  Reason for Consult: Abdominal pain, emesis, rectal bleeding Referring Physician: Dr. Gloris Manchester  Chief Complaint   Abdominal Pain; Rectal Bleeding; Emesis     HPI: [name] is a 66 y.o. female with a past medical history of anxiety, depression, diabetes mellitis, GERD, hyperlipidemia, hypertension, hypothyroidism, and osteoarthritis who presents with severe abdominal pain, emesis, and rectal bleeding. Patient reports yesterday (7/11) she saw GI specialilist for routine appointment for her GERD. Patient reports later in the evening yesterday she began having severe nausea, vomiting, and abdominal pain. These symptoms started in the evening (1800-1900), and overnight she began to have frequent bloody stools as well. She presented to ED this morning and reports 10+ bloody bowel movements between last night and this morning. Patient reports that this type of episode has not happened before. RN and patient report that last bowel movement was 15 minutes ago and was dark red/maroon in nature. Patient does not think she ate anything that might have caused these symptoms. She denies any recent sick contacts. Prior to onset of symptoms, patient reports her bowel movements are usually regular, as she takes a stool softener.  ROS positive for abdominal pain/cramping, nausea, vomiting, bloody stools. Patient has remained afebrile.  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) Panniculectomy with Abdominal (incisional) hernia with mesh in 2019  Other history listed below:  Past Medical History:  Diagnosis Date   Anxiety    Asthma    Chronic pain    Depression     Diabetes mellitus without complication (HCC)    GERD (gastroesophageal reflux disease)    Headache    migraines   Hyperlipidemia    Hypertension    Hypothyroidism    Osteoarthritis    PONV (postoperative nausea and vomiting)     Past Surgical History:  Procedure Laterality Date   ABDOMINAL HYSTERECTOMY     ANKLE RECONSTRUCTION Left    1977   APPENDECTOMY     CARPAL TUNNEL RELEASE Right    2010   catheter ablation     1993   CESAREAN SECTION     x2   CHOLECYSTECTOMY     HERNIA REPAIR     KNEE ARTHROSCOPY W/ MENISCECTOMY Right    2008   KNEE DISLOCATION SURGERY Right    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     2019   TOTAL HIP ARTHROPLASTY Right 04/12/2021   Procedure: TOTAL HIP ARTHROPLASTY ANTERIOR APPROACH;  Surgeon: Ollen Gross, MD;  Location: [facility];  Service: Orthopedics;  Laterality: Right;   vaginal fistula of sigmoid colon      History reviewed. No pertinent family history.  Social History   Tobacco Use   Smoking status: Never    Passive exposure: Never   Smokeless tobacco: Never  Vaping Use   Vaping Use: Never used  Substance Use Topics   Alcohol use: Never   Drug use: Never    Medications: I have reviewed the patient's current medications.  Prior to Admission: Scheduled: Continuous:  ciprofloxacin     lactated ringers     lactated ringers 125 mL/hr at 12/22/21 6063   metronidazole     potassium chloride 10 mEq (12/22/21 1015)   PRN: Anti-infectives (From admission, onward)    Start  Dose/Rate Route Frequency Ordered Stop   12/22/21 1000  metroNIDAZOLE (FLAGYL) IVPB 500 mg        500 mg 100 mL/hr over 60 Minutes Intravenous Every 8 hours 12/22/21 0958     12/22/21 1000  ciprofloxacin (CIPRO) IVPB 400 mg        400 mg 200 mL/hr over 60 Minutes Intravenous Every 12 hours 12/22/21 0958        Allergies  Allergen Reactions   Hydromorphone Shortness Of Breath    Respiratory arrest per patient Other reaction(s): coded with fast admin Note:  Imported from external source.   Penicillins Anaphylaxis   Erythromycin Base Nausea And Vomiting   Ibuprofen Other (See Comments)    Per pt, it makes her bleed   Liraglutide    Meperidine Hives   Nsaids     Other reaction(s): GI bleed Note: Imported from external source.    ROS:  Constitutional: positive for fatigue, negative for chills and fevers Respiratory: negative for shortness of breath Cardiovascular: negative for chest pain and palpitations Gastrointestinal: positive for abdominal pain, diarrhea, and nausea, negative for vomiting Genitourinary:negative for dysuria and frequency Musculoskeletal:negative for back pain and neck pain  Blood pressure (!) 126/103, pulse 81, temperature 98.4 F (36.9 C), temperature source Oral, resp. rate 18, height 5\' 3"  (1.6 m), weight 96.1 kg, SpO2 96 %.  Physical Exam Constitutional:      Appearance: She is obese. She is ill-appearing.     Comments: Fatigued, also just received pain medication. She is able to follow commands and respond to questions  HENT:     Head: Normocephalic.  Cardiovascular:     Comments: Warm and well perfused Pulmonary:     Effort: Pulmonary effort is normal. No respiratory distress.     Comments: Regular work of breathing on 1L [place] Abdominal:     General: Abdomen is flat. There is no distension.     Tenderness: There is abdominal tenderness in the periumbilical area and left upper quadrant. There is no guarding.  Skin:    General: Skin is warm and dry.  Neurological:     Mental Status: She is alert.  Psychiatric:        Mood and Affect: Mood normal.        Behavior: Behavior normal.    Results: Results for orders placed or performed during the hospital encounter of 12/22/21 (from the past 48 hour(s))  Comprehensive metabolic panel     Status: Abnormal   Collection Time: 12/22/21  8:00 AM  Result Value Ref Range   Sodium 137 135 - 145 mmol/L   Potassium 3.2 (L) 3.5 - 5.1 mmol/L   Chloride 101 98 - 111  mmol/L   CO2 26 22 - 32 mmol/L   Glucose, Bld 245 (H) 70 - 99 mg/dL    Comment: Glucose reference range applies only to samples taken after fasting for at least 8 hours.   BUN 18 8 - 23 mg/dL   Creatinine, Ser 02/22/22 0.44 - 1.00 mg/dL   Calcium 8.7 (L) 8.9 - 10.3 mg/dL   Total Protein 6.3 (L) 6.5 - 8.1 g/dL   Albumin 3.3 (L) 3.5 - 5.0 g/dL   AST 20 15 - 41 U/L   ALT 18 0 - 44 U/L   Alkaline Phosphatase 64 38 - 126 U/L   Total Bilirubin 0.6 0.3 - 1.2 mg/dL   GFR, Estimated 9.62 >22 mL/min    Comment: (NOTE) Calculated using the CKD-EPI Creatinine Equation (2021)  Anion gap 10 5 - 15    Comment: Performed at [facility], [address] [place], [place] [address]  Lipase, blood     Status: None   Collection Time: 12/22/21  8:00 AM  Result Value Ref Range   Lipase 36 11 - 51 U/L    Comment: Performed at [facility], [address], [place], [place] [address]  CBC with Diff     Status: Abnormal   Collection Time: 12/22/21  8:00 AM  Result Value Ref Range   WBC 13.7 (H) 4.0 - 10.5 K/uL   RBC 5.11 3.87 - 5.11 MIL/uL   Hemoglobin 15.2 (H) 12.0 - 15.0 g/dL   HCT 96.2 22.9 - 79.8 %   MCV 88.8 80.0 - 100.0 fL   MCH 29.7 26.0 - 34.0 pg   MCHC 33.5 30.0 - 36.0 g/dL   RDW 92.1 19.4 - 17.4 %   Platelets 204 150 - 400 K/uL   nRBC 0.0 0.0 - 0.2 %   Neutrophils Relative % 83 %   Neutro Abs 11.2 (H) 1.7 - 7.7 K/uL   Lymphocytes Relative 11 %   Lymphs Abs 1.6 0.7 - 4.0 K/uL   Monocytes Relative 6 %   Monocytes Absolute 0.8 0.1 - 1.0 K/uL   Eosinophils Relative 0 %   Eosinophils Absolute 0.0 0.0 - 0.5 K/uL   Basophils Relative 0 %   Basophils Absolute 0.1 0.0 - 0.1 K/uL   Immature Granulocytes 0 %   Abs Immature Granulocytes 0.04 0.00 - 0.07 K/uL    Comment: Performed at [facility], [address] [place], [place] [address]  Lactic acid, plasma     Status: Abnormal   Collection Time: 12/22/21  8:00 AM  Result Value Ref Range   Lactic Acid, Venous 2.1 (HH) 0.5 - 1.9 mmol/L     Comment: CRITICAL RESULT CALLED TO, READ BACK BY AND VERIFIED WITH: TANNER,A AT 8:55AM ON 12/22/21 BY [facility] Performed at [facility], [address] [place], [place] [address]   Protime-INR     Status: None   Collection Time: 12/22/21  8:00 AM  Result Value Ref Range   Prothrombin Time 14.0 11.4 - 15.2 seconds   INR 1.1 0.8 - 1.2    Comment: (NOTE) INR goal varies based on device and disease states. Performed at [facility], [address], [place], [place] [address]   Type and screen [facility]     Status: None   Collection Time: 12/22/21  8:00 AM  Result Value Ref Range   ABO/RH(D) A NEG    Antibody Screen NEG    Sample Expiration      12/25/2021,2359 Performed at [facility], [address] [place], [place] [address]   I-Stat Chem 8, ED     Status: Abnormal   Collection Time: 12/22/21  8:05 AM  Result Value Ref Range   Sodium 139 135 - 145 mmol/L   Potassium 3.3 (L) 3.5 - 5.1 mmol/L   Chloride 99 98 - 111 mmol/L   BUN 17 8 - 23 mg/dL   Creatinine, Ser 8.58 0.44 - 1.00 mg/dL   Glucose, Bld 850 (H) 70 - 99 mg/dL    Comment: Glucose reference range applies only to samples taken after fasting for at least 8 hours.   Calcium, Ion 1.02 (L) 1.15 - 1.40 mmol/L   TCO2 26 22 - 32 mmol/L   Hemoglobin 15.6 (H) 12.0 - 15.0 g/dL   HCT 27.7 41.2 - 87.8 %  POC occult blood, ED RN will collect     Status: None   Collection Time: 12/22/21  8:11 AM  Result Value Ref Range   Occult Blood, Feces Positive     CT Angio Abd/Pel W and/or Wo Contrast  Result Date: 12/22/2021 CLINICAL DATA:  GI bleed, lower Abdomen pain and n/v with blood x 1 day. EXAM: CTA ABDOMEN AND PELVIS WITHOUT AND WITH CONTRAST TECHNIQUE: Multidetector CT imaging of the abdomen and pelvis was performed using the standard protocol during bolus administration of intravenous contrast. Multiplanar reconstructed images and MIPs were obtained and reviewed to evaluate the vascular anatomy. RADIATION DOSE  REDUCTION: This exam was performed according to the departmental dose-optimization program which includes automated exposure control, adjustment of the mA and/or kV according to patient size and/or use of iterative reconstruction technique. CONTRAST:  OMNIPAQUE IOHEXOL 350 MG/ML SOLN COMPARISON:  CT AP, 03/21/2014 (report) and 02/23/2007. KUB, 10/30/2020. FINDINGS: VASCULAR Aorta: Normal caliber aorta without aneurysm, dissection, vasculitis or significant stenosis. Celiac: Widely patent without evidence of aneurysm, dissection, vasculitis or significant stenosis. SMA: Widely patent without evidence of aneurysm, dissection, vasculitis or significant stenosis. Renals: A single LEFT and 2 RIGHT renal arteries are present. The renal arteries are widely patent without evidence of aneurysm, dissection, vasculitis, fibromuscular dysplasia or significant stenosis. IMA: Widely patent without evidence of aneurysm, dissection, vasculitis or significant stenosis. Distal IMA territory branches are opacified, to the technical extent of this evaluation. Pelvis: Mild aortic bifurcation and iliac artery atherosclerosis, otherwise patent without evidence of aneurysm, dissection, vasculitis or significant stenosis. Proximal Outflow: Bilateral common femoral and visualized portions of the superficial and profunda femoral arteries are patent without evidence of aneurysm, dissection, vasculitis or significant stenosis. Veins: No obvious venous abnormality within the limitations of this arterial phase study. Review of the MIP images confirms the above findings. NON-VASCULAR Lower chest: No acute abnormality. Hepatobiliary: No focal liver abnormality is seen. Status post cholecystectomy. No biliary dilatation. Pancreas: No pancreatic ductal dilatation or surrounding inflammatory changes. Spleen: Normal in size without focal abnormality. Adrenals/Urinary Tract: Adrenal glands are unremarkable. Kidneys are normal, without renal  calculi, focal lesion, or hydronephrosis. Bladder is unremarkable. Stomach/Bowel: *Stomach is within normal limits. Appendix is not definitively visualized, likely surgically absent. *Moderate amount of colonic mucosal thickening and pericolonic stranding involving the mid to distal transverse colon and extending to the mid to distal descending colon, in a IMA vascular territorial distribution *Postsurgical changes of sigmoidectomy with intact anastomosis. *Mild air and fluid distention colon proximal to the anastomosis without overt colonic obstruction. Lymphatic: No enlarged abdominal or pelvic lymph nodes. Reproductive: Status post hysterectomy. No adnexal masses. Other: No abdominal wall hernia or abnormality. No abdominopelvic ascites. Musculoskeletal: RIGHT hip arthroplasty with proud acetabular fixation screw extending into the RIGHT iliacus. Degenerative changes of the spine. No acute osseous findings. IMPRESSION: VASCULAR 1. No acute vascular abnormality within the abdomen or pelvis or evidence of acute GI bleed. 2. Distal IMA territory branches are opacified, to the technical extent of this evaluation. NON-VASCULAR 1. Colonic mucosal thickening and pericolonic stranding involving the distal transverse to descending colon. Differential diagnosis includes mesenteric ischemia versus colitis, more likely. 2. Postsurgical changes of sigmoidectomy with minimal colonic distention proximal to the anastomosis. No overt colonic obstruction. Additional incidental, chronic and senescent findings as above. Electronically Signed   By: Roanna Banning M.D.   On: 12/22/2021 09:26     Assessment & Plan:  [name] is a 66 y.o. female who presents to the  ED with abdominal pain, severe nausea, vomiting, and bloody stools suggestive of ischemic colitis.  Abdominal CT shows inflammed colon with mucosal thickening and pericolonic stranding involving distal transverse to descending colon.  Given patient's presentation  (emesis, bloody stools, abdominal cramping), physical exam (mild to moderate tenderness in LUQ and periumbilical), and imaging, patient more likely has ischemic colitis rather than mesenteric ischemia. Given patient's history, diverticulitis is also on the differential, however, patient's abdominal tenderness is more localized to LUQ.  Plan: - No surgery required at this time. Continue supportive and monitor for worsening of emesis, abdominal pain/cramping, and bloody stools - NPO, IV fluids - Patient displays regular work of breathing on 1L [place] - IV antibiotics; Patient's WBC 13.7 - continue to monitor for fever, patient has remained afebrile - Will discuss with GI about potential colonoscopy for colonic evaluation  All questions were answered to the satisfaction of the patient and her husband.  Saafir Abdullah (Max) Viki Carrera, Medical Student 12/22/2021, 11:29 AM  Saw patient with Dr. Theophilus Kindsatherine Pappayliou

## 2021-12-23 DIAGNOSIS — D509 Iron deficiency anemia, unspecified: Secondary | ICD-10-CM | POA: Diagnosis not present

## 2021-12-23 DIAGNOSIS — E119 Type 2 diabetes mellitus without complications: Secondary | ICD-10-CM | POA: Diagnosis not present

## 2021-12-23 DIAGNOSIS — K529 Noninfective gastroenteritis and colitis, unspecified: Secondary | ICD-10-CM | POA: Diagnosis not present

## 2021-12-23 DIAGNOSIS — K625 Hemorrhage of anus and rectum: Secondary | ICD-10-CM | POA: Diagnosis not present

## 2021-12-23 DIAGNOSIS — I10 Essential (primary) hypertension: Secondary | ICD-10-CM | POA: Diagnosis not present

## 2021-12-23 DIAGNOSIS — K922 Gastrointestinal hemorrhage, unspecified: Secondary | ICD-10-CM | POA: Diagnosis present

## 2021-12-23 DIAGNOSIS — E876 Hypokalemia: Secondary | ICD-10-CM | POA: Diagnosis not present

## 2021-12-23 DIAGNOSIS — Z886 Allergy status to analgesic agent status: Secondary | ICD-10-CM | POA: Diagnosis not present

## 2021-12-23 DIAGNOSIS — E785 Hyperlipidemia, unspecified: Secondary | ICD-10-CM | POA: Diagnosis not present

## 2021-12-23 DIAGNOSIS — E669 Obesity, unspecified: Secondary | ICD-10-CM | POA: Diagnosis not present

## 2021-12-23 DIAGNOSIS — Z880 Allergy status to penicillin: Secondary | ICD-10-CM | POA: Diagnosis not present

## 2021-12-23 DIAGNOSIS — Z794 Long term (current) use of insulin: Secondary | ICD-10-CM | POA: Diagnosis not present

## 2021-12-23 DIAGNOSIS — E039 Hypothyroidism, unspecified: Secondary | ICD-10-CM | POA: Diagnosis not present

## 2021-12-23 DIAGNOSIS — Z881 Allergy status to other antibiotic agents status: Secondary | ICD-10-CM | POA: Diagnosis not present

## 2021-12-23 DIAGNOSIS — Z888 Allergy status to other drugs, medicaments and biological substances status: Secondary | ICD-10-CM | POA: Diagnosis not present

## 2021-12-23 DIAGNOSIS — K219 Gastro-esophageal reflux disease without esophagitis: Secondary | ICD-10-CM | POA: Diagnosis not present

## 2021-12-23 DIAGNOSIS — Z79899 Other long term (current) drug therapy: Secondary | ICD-10-CM | POA: Diagnosis not present

## 2021-12-23 DIAGNOSIS — Z79818 Long term (current) use of other agents affecting estrogen receptors and estrogen levels: Secondary | ICD-10-CM | POA: Diagnosis not present

## 2021-12-23 DIAGNOSIS — Z885 Allergy status to narcotic agent status: Secondary | ICD-10-CM | POA: Diagnosis not present

## 2021-12-23 DIAGNOSIS — G8929 Other chronic pain: Secondary | ICD-10-CM | POA: Diagnosis not present

## 2021-12-23 DIAGNOSIS — Z96641 Presence of right artificial hip joint: Secondary | ICD-10-CM | POA: Diagnosis not present

## 2021-12-23 DIAGNOSIS — F32A Depression, unspecified: Secondary | ICD-10-CM | POA: Diagnosis not present

## 2021-12-23 DIAGNOSIS — J45909 Unspecified asthma, uncomplicated: Secondary | ICD-10-CM | POA: Diagnosis not present

## 2021-12-23 LAB — GASTROINTESTINAL PANEL BY PCR, STOOL (REPLACES STOOL CULTURE)

## 2021-12-23 LAB — CBC
HCT: 43.5 % (ref 36.0–46.0)
Hemoglobin: 13.7 g/dL (ref 12.0–15.0)
MCH: 29 pg (ref 26.0–34.0)
MCHC: 31.5 g/dL (ref 30.0–36.0)
MCV: 92 fL (ref 80.0–100.0)
Platelets: 179 10*3/uL (ref 150–400)
RBC: 4.73 MIL/uL (ref 3.87–5.11)
RDW: 13.1 % (ref 11.5–15.5)
WBC: 13.9 10*3/uL — ABNORMAL HIGH (ref 4.0–10.5)
nRBC: 0 % (ref 0.0–0.2)

## 2021-12-23 LAB — COMPREHENSIVE METABOLIC PANEL
ALT: 15 U/L (ref 0–44)
AST: 16 U/L (ref 15–41)
Albumin: 2.8 g/dL — ABNORMAL LOW (ref 3.5–5.0)
Alkaline Phosphatase: 54 U/L (ref 38–126)
Anion gap: 7 (ref 5–15)
BUN: 9 mg/dL (ref 8–23)
CO2: 26 mmol/L (ref 22–32)
Calcium: 8.4 mg/dL — ABNORMAL LOW (ref 8.9–10.3)
Chloride: 104 mmol/L (ref 98–111)
Creatinine, Ser: 0.53 mg/dL (ref 0.44–1.00)
GFR, Estimated: >60 mL/min (ref >60)
Glucose, Bld: 217 mg/dL — ABNORMAL HIGH (ref 70–99)
Potassium: 3.3 mmol/L — ABNORMAL LOW (ref 3.5–5.1)
Sodium: 137 mmol/L (ref 135–145)
Total Bilirubin: 0.6 mg/dL (ref 0.3–1.2)
Total Protein: 5.8 g/dL — ABNORMAL LOW (ref 6.5–8.1)

## 2021-12-23 LAB — GLUCOSE, CAPILLARY
Glucose-Capillary: 219 mg/dL — ABNORMAL HIGH (ref 70–99)
Glucose-Capillary: 217 mg/dL — ABNORMAL HIGH (ref 70–99)
Glucose-Capillary: 181 mg/dL — ABNORMAL HIGH (ref 70–99)
Glucose-Capillary: 180 mg/dL — ABNORMAL HIGH (ref 70–99)

## 2021-12-23 LAB — PROTIME-INR
INR: 1.2 (ref 0.8–1.2)
Prothrombin Time: 14.9 seconds (ref 11.4–15.2)

## 2021-12-23 LAB — PHOSPHORUS: Phosphorus: 2.4 mg/dL — ABNORMAL LOW (ref 2.5–4.6)

## 2021-12-23 LAB — MAGNESIUM: Magnesium: 1.5 mg/dL — ABNORMAL LOW (ref 1.7–2.4)

## 2021-12-23 MED ORDER — POTASSIUM CHLORIDE 10 MEQ/100ML IV SOLN
10.0000 meq | INTRAVENOUS | Status: AC
  Administered 2021-12-23 (×3): 10 meq via INTRAVENOUS
  Filled 2021-12-23 (×3): qty 100

## 2021-12-23 MED ORDER — POTASSIUM CHLORIDE CRYS ER 20 MEQ PO TBCR: 40.0000 meq | EXTENDED_RELEASE_TABLET | Freq: Once | ORAL | Status: DC

## 2021-12-23 NOTE — Progress Notes (Signed)
PROGRESS NOTE    Patient: [name]                            PCP: Waldron Session, FNP                    DOB: November 29, 1955            DOA: 12/22/2021 DTO:[id number]             DOS: 12/23/2021, 12:54 PM   LOS: 0 days   Date of Service: The patient was seen and examined on 12/23/2021  Subjective:   The patient was seen and examined this morning. Hemodynamically stable Abdominal pain tolerated improved with IV pain meds   Brief Narrative:   [name] is a 66 y.o. female with medical history significant of DM2, HTN, hypothyroidism, GERD, depression, iron deficiency anemia, osteoarthritis, chronic pain, sigmoidectomy secondary to diverticulosis back in 2012 comes to the hospital with complaints of rectal bleeding.  Patient states yesterday evening she started having abdominal discomfort with mild nausea and vomiting followed by multiple episodes of bloody bowel movement.  She tells me that she has had at least 10+ bowel movements overnight and even while she has been in the ED.  Her last colonoscopy was in 2019 which was overall unremarkable. She denies any fevers and chills.   In the ED she was noted to have leukocytosis.  CT of the abdomen pelvis showed concerns for colonic thickening in the transverse and descending colon suggestive of possible ischemic versus infectious colitis.  Had elevated lactic acid levels.  She was seen by general surgery who recommended conservative management.  GI was also consulted.   Assessment/Plan Principal Problem:   Rectal bleed Active Problems:   Hypokalemia   Hypocalcemia   Colitis   HTN (hypertension)   HLD (hyperlipidemia)   DM2 (diabetes mellitus, type 2) (HCC)   Iron deficiency anemia   Depression     Rectal bleed, lower GI bleed Colitis transverse and descending colon - There is suspicion for ischemic versus infectious colitis.   -Continue current antibiotics of IV Cipro and Flagyl -Continuing IV fluid -No surgical invention  per surgery -GI following closely -agreed with current management, considering advancing diet, pending GI intervention at this point  - Stool studies have been ordered, C. difficile negative  -Currently hemoglobin remained stable at 13.7 -  pain control.   Hypokalemia/hypoCalcemia - Repletion ordered.  Check magnesium and phosphorus levels.   Essential hypertension -Currently home meds on hold.  IV as needed Lopressor and hydralazine ordered  Diabetes mellitus type 2 -Home metformin on hold.  Lactic acid slightly elevated.  As oral tolerance improves this can be slowly resumed.  In the meantime sliding scale and Accu-Cheks  Hyperlipidemia -Statin  Hypothyroidism -On home Synthroid currently on hold.  Depression -Home meds on hold  Iron deficiency anemia -Home meds on hold           ----------------------------------------------------------------------------------------------------------------------------------------------- Nutritional status:  The patient's BMI is: Body mass index is 37.14 kg/m. I agree with the assessment and plan as outlined below: Nutrition Status:       -----------------------------------------------------------------------------------------------------------------------------------------------  DVT prophylaxis:  SCDs Start: 12/22/21 1240   Code Status:   Code Status: Full Code  Family Communication: No family member present at bedside- attempt will be made to update daily The above findings and plan of care has been discussed with patient (and family)  in  detail,  they expressed understanding and agreement of above. -Advance care planning has been discussed.   Admission status:   Status is: Observation The patient remains OBS appropriate and will d/c before 2 midnights.  Continue monitoring for rectal bleed     Procedures:   No admission procedures for hospital encounter.   Antimicrobials:  Anti-infectives (From admission,  onward)    Start     Dose/Rate Route Frequency Ordered Stop   12/22/21 1000  metroNIDAZOLE (FLAGYL) IVPB 500 mg        500 mg 100 mL/hr over 60 Minutes Intravenous Every 8 hours 12/22/21 0958     12/22/21 1000  ciprofloxacin (CIPRO) IVPB 400 mg        400 mg 200 mL/hr over 60 Minutes Intravenous Every 12 hours 12/22/21 0958          Medication:   insulin aspart  0-9 Units Subcutaneous TID WC    acetaminophen **OR** acetaminophen, bisacodyl, guaiFENesin, hydrALAZINE, ipratropium-albuterol, metoprolol tartrate, morphine injection, oxyCODONE, prochlorperazine, senna-docusate, traZODone   Objective:   Vitals:   12/22/21 1330 12/22/21 1409 12/22/21 2052 12/23/21 0519  BP:  125/76 119/60 139/67  Pulse: 93 78 73 81  Resp: 14 16 20 18   Temp:  97.8 F (36.6 C) 98.5 F (36.9 C) 98 F (36.7 C)  TempSrc:  Oral  Oral  SpO2: 98% 96% 95% 92%  Weight:  95.1 kg    Height:  5\' 3"  (1.6 m)      Intake/Output Summary (Last 24 hours) at 12/23/2021 1254 Last data filed at 12/23/2021 0900 Gross per 24 hour  Intake 760 ml  Output --  Net 760 ml   Filed Weights   12/22/21 0726 12/22/21 1409  Weight: 96.1 kg 95.1 kg     Examination:   Physical Exam  Constitution:  Alert, cooperative, no distress,  Appears calm and comfortable  Psychiatric:   Normal and stable mood and affect, cognition intact,   HEENT:        Normocephalic, PERRL, otherwise with in Normal limits  Chest:         Chest symmetric Cardio vascular:  S1/S2, RRR, No murmure, No Rubs or Gallops  pulmonary: Clear to auscultation bilaterally, respirations unlabored, negative wheezes / crackles Abdomen: Soft, non-tender, non-distended, bowel sounds,no masses, no organomegaly Muscular skeletal: Limited exam - in bed, able to move all 4 extremities,   Neuro: CNII-XII intact. , normal motor and sensation, reflexes intact  Extremities: No pitting edema lower extremities, +2 pulses  Skin: Dry, warm to touch, negative for any  Rashes, No open wounds Wounds: per nursing documentation   ------------------------------------------------------------------------------------------------------------------------------------------    LABs:     Latest Ref Rng & Units 12/23/2021    5:27 AM 12/22/2021    8:05 AM 12/22/2021    8:00 AM  CBC  WBC 4.0 - 10.5 K/uL 13.9   13.7   Hemoglobin 12.0 - 15.0 g/dL 02/22/2022  02/22/2022  81.7   Hematocrit 36.0 - 46.0 % 43.5  46.0  45.4   Platelets 150 - 400 K/uL 179   204       Latest Ref Rng & Units 12/23/2021    5:27 AM 12/22/2021    8:05 AM 12/22/2021    8:00 AM  CMP  Glucose 70 - 99 mg/dL 02/22/2022  02/22/2022  903   BUN 8 - 23 mg/dL 9  17  18    Creatinine 0.44 - 1.00 mg/dL 833  383    Sodium 135 - 145  mmol/L 137  139  137   Potassium 3.5 - 5.1 mmol/L 3.3  3.3  3.2   Chloride 98 - 111 mmol/L 104  99  101   CO2 22 - 32 mmol/L 26   26   Calcium 8.9 - 10.3 mg/dL 8.4   8.7   Total Protein 6.5 - 8.1 g/dL 5.8   6.3   Total Bilirubin 0.3 - 1.2 mg/dL 0.6   0.6   Alkaline Phos 38 - 126 U/L 54   64   AST 15 - 41 U/L 16   20   ALT 0 - 44 U/L 15   18        Micro Results Recent Results (from the past 240 hour(s))  C Difficile Quick Screen w PCR reflex     Status: None   Collection Time: 12/22/21 12:03 PM   Specimen: STOOL  Result Value Ref Range Status   C Diff antigen NEGATIVE NEGATIVE Final   C Diff toxin NEGATIVE NEGATIVE Final   C Diff interpretation No C. difficile detected.  Final    Comment: Performed at [facility], [address] [place], [place] [address]  Gastrointestinal Panel by PCR , Stool     Status: None   Collection Time: 12/22/21 12:04 PM   Specimen: STOOL  Result Value Ref Range Status   Campylobacter species NOT DETECTED NOT DETECTED Final   Plesimonas shigelloides NOT DETECTED NOT DETECTED Final   Salmonella species NOT DETECTED NOT DETECTED Final   Yersinia enterocolitica NOT DETECTED NOT DETECTED Final   Vibrio species NOT DETECTED NOT DETECTED Final   Vibrio  cholerae NOT DETECTED NOT DETECTED Final   Enteroaggregative E coli (EAEC) NOT DETECTED NOT DETECTED Final   Enteropathogenic E coli (EPEC) NOT DETECTED NOT DETECTED Final   Enterotoxigenic E coli (ETEC) NOT DETECTED NOT DETECTED Final   Shiga like toxin producing E coli (STEC) NOT DETECTED NOT DETECTED Final   Shigella/Enteroinvasive E coli (EIEC) NOT DETECTED NOT DETECTED Final   Cryptosporidium NOT DETECTED NOT DETECTED Final   Cyclospora cayetanensis NOT DETECTED NOT DETECTED Final   Entamoeba histolytica NOT DETECTED NOT DETECTED Final   Giardia lamblia NOT DETECTED NOT DETECTED Final   Adenovirus F40/41 NOT DETECTED NOT DETECTED Final   Astrovirus NOT DETECTED NOT DETECTED Final   Norovirus GI/GII NOT DETECTED NOT DETECTED Final   Rotavirus A NOT DETECTED NOT DETECTED Final   Sapovirus (I, II, IV, and V) NOT DETECTED NOT DETECTED Final    Comment: Performed at [facility], [address], [place], [place] [address]    Radiology Reports No results found.  SIGNED: Kendell Bane, MD, FHM. [facility],  Pager (please use [email or web address] to page/text) Please use Epic Secure Chat for non-urgent communication (7AM-7PM)  If 7PM-7AM, please contact night-coverage [email or web address], 12/23/2021, 12:54 PM

## 2021-12-23 NOTE — TOC Progression Note (Signed)
  Transition of Care (TOC) Screening Note   Patient Details  Name: [name] Date of Birth: [birth year]   Transition of Care [facility]) CM/SW Contact:    Leitha Bleak, RN Phone Number: 12/23/2021, 2:10 PM    Transition of Care Department [facility]) has reviewed patient and no TOC needs have been identified at this time. We will continue to monitor patient advancement through interdisciplinary progression rounds. If new patient transition needs arise, please place a TOC consult.         Barriers to Discharge: Continued Medical Work up

## 2021-12-23 NOTE — Progress Notes (Signed)
Gastroenterology Progress Note   Referring Provider: No ref. provider found Primary Care Physician:  Waldron Session, FNP Primary Gastroenterologist:  Dr.  Patient ID: [name]; [id number]; Mar 22, 1956   Subjective:    Feels some improved.  Abdominal pain less severe, mostly crampy.  Passed a small amount of blood almost hourly throughout the night that seems to be getting less.  Really does not have a good appetite.  No vomiting.  Objective:   Vital signs in last 24 hours: Temp:  [97.8 F (36.6 C)-98.5 F (36.9 C)] 98 F (36.7 C) (07/13 0519) Pulse Rate:  [73-93] 81 (07/13 0519) Resp:  [14-26] 18 (07/13 0519) BP: (119-171)/(60-145) 139/67 (07/13 0519) SpO2:  [90 %-98 %] 92 % (07/13 0519) FiO2 (%):  [24 %] 24 % (07/12 1240) Weight:  [95.1 kg] 95.1 kg (07/12 1409) Last BM Date : 12/22/21 General:   Alert,  Well-developed, well-nourished, pleasant and cooperative in NAD Head:  Normocephalic and atraumatic. Eyes:  Sclera clear, no icterus.  Abdomen:  Soft, nondistended.  Moderate left lower quadrant tenderness . normal bowel sounds, without guarding, and without rebound.   Extremities:  Without clubbing, deformity or edema. Neurologic:  Alert and  oriented x4;  grossly normal neurologically. Skin:  Intact without significant lesions or rashes. Psych:  Alert and cooperative. Normal mood and affect.  Intake/Output from previous day: 07/12 0701 - 07/13 0700 In: 1120 [P.O.:120; IV Piggyback:1000] Out: -  Intake/Output this shift: Total I/O In: 240 [P.O.:240] Out: -   Lab Results: CBC Recent Labs    12/22/21 0800 12/22/21 0805 12/23/21 0527  WBC 13.7*  --  13.9*  HGB 15.2* 15.6* 13.7  HCT 45.4 46.0 43.5  MCV 88.8  --  92.0  PLT 204  --  179   BMET Recent Labs    12/22/21 0800 12/22/21 0805 12/23/21 0527  NA 137 139 137  K 3.2* 3.3* 3.3*  CL 101 99 104  CO2 26  --  26  GLUCOSE 245* 244* 217*  BUN 18 17 9   CREATININE 0.82 0.60 0.53  CALCIUM 8.7*  --   8.4*   LFTs Recent Labs    12/22/21 0800 12/23/21 0527  BILITOT 0.6 0.6  ALKPHOS 64 54  AST 20 16  ALT 18 15  PROT 6.3* 5.8*  ALBUMIN 3.3* 2.8*   Recent Labs    12/22/21 0800  LIPASE 36   PT/INR Recent Labs    12/22/21 0800 12/23/21 0527  LABPROT 14.0 14.9  INR 1.1 1.2      Imaging Studies: CT Angio Abd/Pel W and/or Wo Contrast  Result Date: 12/22/2021 CLINICAL DATA:  GI bleed, lower Abdomen pain and n/v with blood x 1 day. EXAM: CTA ABDOMEN AND PELVIS WITHOUT AND WITH CONTRAST TECHNIQUE: Multidetector CT imaging of the abdomen and pelvis was performed using the standard protocol during bolus administration of intravenous contrast. Multiplanar reconstructed images and MIPs were obtained and reviewed to evaluate the vascular anatomy. RADIATION DOSE REDUCTION: This exam was performed according to the departmental dose-optimization program which includes automated exposure control, adjustment of the mA and/or kV according to patient size and/or use of iterative reconstruction technique. CONTRAST:  02/22/2022 OMNIPAQUE IOHEXOL 350 MG/ML SOLN COMPARISON:  CT AP, 03/21/2014 (report) and 02/23/2007. KUB, 10/30/2020. FINDINGS: VASCULAR Aorta: Normal caliber aorta without aneurysm, dissection, vasculitis or significant stenosis. Celiac: Widely patent without evidence of aneurysm, dissection, vasculitis or significant stenosis. SMA: Widely patent without evidence of aneurysm, dissection, vasculitis or significant stenosis. Renals:  A single LEFT and 2 RIGHT renal arteries are present. The renal arteries are widely patent without evidence of aneurysm, dissection, vasculitis, fibromuscular dysplasia or significant stenosis. IMA: Widely patent without evidence of aneurysm, dissection, vasculitis or significant stenosis. Distal IMA territory branches are opacified, to the technical extent of this evaluation. Pelvis: Mild aortic bifurcation and iliac artery atherosclerosis, otherwise patent without  evidence of aneurysm, dissection, vasculitis or significant stenosis. Proximal Outflow: Bilateral common femoral and visualized portions of the superficial and profunda femoral arteries are patent without evidence of aneurysm, dissection, vasculitis or significant stenosis. Veins: No obvious venous abnormality within the limitations of this arterial phase study. Review of the MIP images confirms the above findings. NON-VASCULAR Lower chest: No acute abnormality. Hepatobiliary: No focal liver abnormality is seen. Status post cholecystectomy. No biliary dilatation. Pancreas: No pancreatic ductal dilatation or surrounding inflammatory changes. Spleen: Normal in size without focal abnormality. Adrenals/Urinary Tract: Adrenal glands are unremarkable. Kidneys are normal, without renal calculi, focal lesion, or hydronephrosis. Bladder is unremarkable. Stomach/Bowel: *Stomach is within normal limits. Appendix is not definitively visualized, likely surgically absent. *Moderate amount of colonic mucosal thickening and pericolonic stranding involving the mid to distal transverse colon and extending to the mid to distal descending colon, in a IMA vascular territorial distribution *Postsurgical changes of sigmoidectomy with intact anastomosis. *Mild air and fluid distention colon proximal to the anastomosis without overt colonic obstruction. Lymphatic: No enlarged abdominal or pelvic lymph nodes. Reproductive: Status post hysterectomy. No adnexal masses. Other: No abdominal wall hernia or abnormality. No abdominopelvic ascites. Musculoskeletal: RIGHT hip arthroplasty with proud acetabular fixation screw extending into the RIGHT iliacus. Degenerative changes of the spine. No acute osseous findings. IMPRESSION: VASCULAR 1. No acute vascular abnormality within the abdomen or pelvis or evidence of acute GI bleed. 2. Distal IMA territory branches are opacified, to the technical extent of this evaluation. NON-VASCULAR 1. Colonic  mucosal thickening and pericolonic stranding involving the distal transverse to descending colon. Differential diagnosis includes mesenteric ischemia versus colitis, more likely. 2. Postsurgical changes of sigmoidectomy with minimal colonic distention proximal to the anastomosis. No overt colonic obstruction. Additional incidental, chronic and senescent findings as above. Electronically Signed   By: Roanna Banning M.D.   On: 12/22/2021 09:26  [2 weeks]  Assessment:   Suspected ischemic colitis: CTA abdomen and pelvis with mucosal thickening and pericolonic stranding involving the distal transverse to descending colon. No evidence of significant mesenteric occlusive disease. Last colonoscopy 2019.  C. difficile quick screen negative.  GI pathogen panel negative.  Hemoglobin 13.7 today. Clinically continues to improve with less abdominal pain. Still having frequent blood per rectum but improving. Appetite remains poor.     Plan:   Consider discontinuing antibiotics. Continue supportive measures. Continue clear liquids for now.  May consider advancing diet at dinner depending on how she is doing. Would recommend outpatient colonoscopy, [facility], once current symptoms resolved. F/u cbc tomorrow. Depending on clinical course over the next 24 hours, she may be able to be discharged tomorrow.     LOS: 0 days   Leanna Battles. Dixon Boos [facility] [phone] 7/13/202311:11 AM

## 2021-12-23 NOTE — Hospital Course (Addendum)
[name] is a 66 y.o. female with medical history significant of DM2, HTN, hypothyroidism, GERD, depression, iron deficiency anemia, osteoarthritis, chronic pain, sigmoidectomy secondary to diverticulosis back in 2012 comes to the hospital with complaints of rectal bleeding.  Patient states yesterday evening she started having abdominal discomfort with mild nausea and vomiting followed by multiple episodes of bloody bowel movement.  She tells me that she has had at least 10+ bowel movements overnight and even while she has been in the ED.  Her last colonoscopy was in 2019 which was overall unremarkable. She denies any fevers and chills.   In the ED she was noted to have leukocytosis.  CT of the abdomen pelvis showed concerns for colonic thickening in the transverse and descending colon suggestive of possible ischemic versus infectious colitis.  Had elevated lactic acid levels.  She was seen by general surgery who recommended conservative management.  GI was also consulted.   Assessment/Plan Principal Problem:   Rectal bleed Active Problems:   Hypokalemia   Hypocalcemia   Colitis   HTN (hypertension)   HLD (hyperlipidemia)   DM2 (diabetes mellitus, type 2) (HCC)   Iron deficiency anemia   Depression     Rectal bleed, lower GI bleed Colitis transverse and descending colon - There is suspicion for ischemic versus infectious colitis.   -Continue current antibiotics of IV Cipro and Flagyl -Continued IV fluid -No surgical invention per surgery -GI following closely -agreed with current management, considering advancing diet, pending GI intervention at this point -Diet advanced to soft diet 7/14 and patient tolerated  - Stool studies have been ordered, C. difficile negative, stool pathogen panel neg -no further hematochezia x 24 hours -Currently hemoglobin remained stable  -4 more days abx after d/c to finish one week   Hypokalemia/hypocalcemia/Hypomagnesemia - Repletion ordered.     Essential hypertension -Currently home meds on hold.  IV as needed Lopressor and hydralazine ordered  Diabetes mellitus type 2 -Home metformin on hold.  Lactic acid slightly elevated.  As oral tolerance improves this can be slowly resumed.  In the meantime sliding scale and Accu-Cheks -restart metformin after d/c  Hyperlipidemia -Statin  Hypothyroidism -On home Synthroid>>restart  Depression -Home meds>>restart  Iron deficiency anemia -Home meds on hold>>restart  Hypokalemia -repleted

## 2021-12-23 NOTE — Inpatient Diabetes Management (Signed)
Inpatient Diabetes Program Recommendations  AACE/ADA: New Consensus Statement on Inpatient Glycemic Control (2015)  Target Ranges:  Prepandial:   less than 140 mg/dL      Peak postprandial:   less than 180 mg/dL (1-2 hours)      Critically ill patients:  140 - 180 mg/dL   Lab Results  Component Value Date   GLUCAP 219 (H) 12/23/2021   HGBA1C 7.3 (H) 12/22/2021    Review of Glycemic Control  Latest Reference Range & Units 04/13/21 07:56 04/13/21 11:43 04/13/21 16:37 04/13/21 21:00 04/14/21 07:29 12/22/21 16:22 12/22/21 22:07 12/23/21 06:22  Glucose-Capillary 70 - 99 mg/dL 003 (H) 491 (H) 791 (H) 269 (H) 195 (H) 237 (H) 247 (H) 219 (H)  (H): Data is abnormally high  Diabetes history: DM2 Outpatient Diabetes medications: Soliqua 32 units qd (Lantus + Lixisenatide), Metformin 500 mg prn (?) Current orders for Inpatient glycemic control: Novolog 0-9 units tid correction  Inpatient Diabetes Program Recommendations:   Patient currently on clear liquids. Please consider: -Semglee 16 units qd (50% home basal insulin dose)  Thank you, Darel Hong E. Keidan Aumiller, RN, MSN, CDE  Diabetes Coordinator Inpatient Glycemic Control Team Team Pager [phone] (8am-5pm) 12/23/2021 10:03 AM

## 2021-12-23 NOTE — Progress Notes (Addendum)
[facility] Progress Note     Subjective: Abdominal pain, nausea, and vomiting have improved since yesterday. Patient reports a few bowel movements in the evening/overnight that are still bloody with mucous-like consistency, but the frequency of bowel movements have decreased since when she presented. No acute concerns or issues overnight. Denies headache, fever, chills.  Objective: Vital signs in last 24 hours: Temp:  [97.8 F (36.6 C)-98.5 F (36.9 C)] 98 F (36.7 C) (07/13 0519) Pulse Rate:  [73-93] 81 (07/13 0519) Resp:  [11-26] 18 (07/13 0519) BP: (119-171)/(60-145) 139/67 (07/13 0519) SpO2:  [90 %-99 %] 92 % (07/13 0519) FiO2 (%):  [24 %] 24 % (07/12 1240) Weight:  [95.1 kg] 95.1 kg (07/12 1409) Last BM Date : 12/22/21  Intake/Output from previous day: 07/12 0701 - 07/13 0700 In: 1120 [P.O.:120; IV Piggyback:1000] Out: -  Intake/Output this shift: No intake/output data recorded.  General appearance: alert, cooperative, and no distress Resp: clear to auscultation bilaterally, regular work of breathing Cardio: normal rate, regular rhythm, no murmurs GI: soft, non-distended, no percussion tenderness, mild tenderness to palpation in periumbilical region. No rigidity, guarding, or rebound tenderness  Skin: warm and dry  Lab Results:  Recent Labs    12/22/21 0800 12/22/21 0805 12/23/21 0527  WBC 13.7*  --  13.9*  HGB 15.2* 15.6* 13.7  HCT 45.4 46.0 43.5  PLT 204  --  179   BMET Recent Labs    12/22/21 0800 12/22/21 0805 12/23/21 0527  NA 137 139 137  K 3.2* 3.3* 3.3*  CL 101 99 104  CO2 26  --  26  GLUCOSE 245* 244* 217*  BUN 18 17 9   CREATININE 0.82 0.60 0.53  CALCIUM 8.7*  --  8.4*   PT/INR Recent Labs    12/22/21 0800 12/23/21 0527  LABPROT 14.0 14.9  INR 1.1 1.2    Studies/Results: CT Angio Abd/Pel W and/or Wo Contrast  Result Date: 12/22/2021 CLINICAL DATA:  GI bleed, lower Abdomen pain and n/v with blood x 1 day. EXAM:  CTA ABDOMEN AND PELVIS WITHOUT AND WITH CONTRAST TECHNIQUE: Multidetector CT imaging of the abdomen and pelvis was performed using the standard protocol during bolus administration of intravenous contrast. Multiplanar reconstructed images and MIPs were obtained and reviewed to evaluate the vascular anatomy. RADIATION DOSE REDUCTION: This exam was performed according to the departmental dose-optimization program which includes automated exposure control, adjustment of the mA and/or kV according to patient size and/or use of iterative reconstruction technique. CONTRAST:  02/22/2022 OMNIPAQUE IOHEXOL 350 MG/ML SOLN COMPARISON:  CT AP, 03/21/2014 (report) and 02/23/2007. KUB, 10/30/2020. FINDINGS: VASCULAR Aorta: Normal caliber aorta without aneurysm, dissection, vasculitis or significant stenosis. Celiac: Widely patent without evidence of aneurysm, dissection, vasculitis or significant stenosis. SMA: Widely patent without evidence of aneurysm, dissection, vasculitis or significant stenosis. Renals: A single LEFT and 2 RIGHT renal arteries are present. The renal arteries are widely patent without evidence of aneurysm, dissection, vasculitis, fibromuscular dysplasia or significant stenosis. IMA: Widely patent without evidence of aneurysm, dissection, vasculitis or significant stenosis. Distal IMA territory branches are opacified, to the technical extent of this evaluation. Pelvis: Mild aortic bifurcation and iliac artery atherosclerosis, otherwise patent without evidence of aneurysm, dissection, vasculitis or significant stenosis. Proximal Outflow: Bilateral common femoral and visualized portions of the superficial and profunda femoral arteries are patent without evidence of aneurysm, dissection, vasculitis or significant stenosis. Veins: No obvious venous abnormality within the limitations of this arterial phase study. Review of the MIP images confirms  the above findings. NON-VASCULAR Lower chest: No acute abnormality.  Hepatobiliary: No focal liver abnormality is seen. Status post cholecystectomy. No biliary dilatation. Pancreas: No pancreatic ductal dilatation or surrounding inflammatory changes. Spleen: Normal in size without focal abnormality. Adrenals/Urinary Tract: Adrenal glands are unremarkable. Kidneys are normal, without renal calculi, focal lesion, or hydronephrosis. Bladder is unremarkable. Stomach/Bowel: *Stomach is within normal limits. Appendix is not definitively visualized, likely surgically absent. *Moderate amount of colonic mucosal thickening and pericolonic stranding involving the mid to distal transverse colon and extending to the mid to distal descending colon, in a IMA vascular territorial distribution *Postsurgical changes of sigmoidectomy with intact anastomosis. *Mild air and fluid distention colon proximal to the anastomosis without overt colonic obstruction. Lymphatic: No enlarged abdominal or pelvic lymph nodes. Reproductive: Status post hysterectomy. No adnexal masses. Other: No abdominal wall hernia or abnormality. No abdominopelvic ascites. Musculoskeletal: RIGHT hip arthroplasty with proud acetabular fixation screw extending into the RIGHT iliacus. Degenerative changes of the spine. No acute osseous findings. IMPRESSION: VASCULAR 1. No acute vascular abnormality within the abdomen or pelvis or evidence of acute GI bleed. 2. Distal IMA territory branches are opacified, to the technical extent of this evaluation. NON-VASCULAR 1. Colonic mucosal thickening and pericolonic stranding involving the distal transverse to descending colon. Differential diagnosis includes mesenteric ischemia versus colitis, more likely. 2. Postsurgical changes of sigmoidectomy with minimal colonic distention proximal to the anastomosis. No overt colonic obstruction. Additional incidental, chronic and senescent findings as above. Electronically Signed   By: Roanna Banning M.D.   On: 12/22/2021 09:26     Anti-infectives: Anti-infectives (From admission, onward)    Start     Dose/Rate Route Frequency Ordered Stop   12/22/21 1000  metroNIDAZOLE (FLAGYL) IVPB 500 mg        500 mg 100 mL/hr over 60 Minutes Intravenous Every 8 hours 12/22/21 0958     12/22/21 1000  ciprofloxacin (CIPRO) IVPB 400 mg        400 mg 200 mL/hr over 60 Minutes Intravenous Every 12 hours 12/22/21 6237         Assessment/Plan: [name] is a 65 y.o. female who presented on 7/12 with abdominal pain, severe nausea, vomiting, and bloody stools suggestive of ischemic colitis.  Today, her abdominal pain and cramping have improved, as well as her nausea. She reports bloody, mucous stools. Patient is afebrile and hemodynamically stable.  - No acute surgical intervention required at this time. Continue supportive, conservative management and monitoring for worsening of emesis, abdominal pain/cramping, and bloody stools - Fluid diet, IV fluids - Patient displays regular work of breathing on RA - IV antibiotics: Cipro/Flagyl; Patient's WBC 13.9 - continue to monitor for fever, patient has remained afebrile   LOS: 0 days   Marchell Froman (Max) Adalaide Jaskolski, Medical Student 12/23/2021

## 2021-12-24 ENCOUNTER — Telehealth: Payer: Self-pay | Admitting: Gastroenterology

## 2021-12-24 DIAGNOSIS — E876 Hypokalemia: Secondary | ICD-10-CM | POA: Diagnosis not present

## 2021-12-24 DIAGNOSIS — K625 Hemorrhage of anus and rectum: Secondary | ICD-10-CM | POA: Diagnosis not present

## 2021-12-24 DIAGNOSIS — K922 Gastrointestinal hemorrhage, unspecified: Secondary | ICD-10-CM

## 2021-12-24 DIAGNOSIS — K529 Noninfective gastroenteritis and colitis, unspecified: Secondary | ICD-10-CM | POA: Diagnosis not present

## 2021-12-24 DIAGNOSIS — I10 Essential (primary) hypertension: Secondary | ICD-10-CM | POA: Diagnosis not present

## 2021-12-24 LAB — BASIC METABOLIC PANEL
Anion gap: 7 (ref 5–15)
BUN: 7 mg/dL — ABNORMAL LOW (ref 8–23)
CO2: 26 mmol/L (ref 22–32)
Calcium: 8.4 mg/dL — ABNORMAL LOW (ref 8.9–10.3)
Chloride: 105 mmol/L (ref 98–111)
Creatinine, Ser: 0.56 mg/dL (ref 0.44–1.00)
GFR, Estimated: >60 mL/min (ref >60)
Glucose, Bld: 159 mg/dL — ABNORMAL HIGH (ref 70–99)
Potassium: 3.4 mmol/L — ABNORMAL LOW (ref 3.5–5.1)
Sodium: 138 mmol/L (ref 135–145)

## 2021-12-24 LAB — CBC
HCT: 40.1 % (ref 36.0–46.0)
Hemoglobin: 12.8 g/dL (ref 12.0–15.0)
MCH: 29.4 pg (ref 26.0–34.0)
MCHC: 31.9 g/dL (ref 30.0–36.0)
MCV: 92 fL (ref 80.0–100.0)
Platelets: 182 10*3/uL (ref 150–400)
RBC: 4.36 MIL/uL (ref 3.87–5.11)
RDW: 12.9 % (ref 11.5–15.5)
WBC: 12.4 10*3/uL — ABNORMAL HIGH (ref 4.0–10.5)
nRBC: 0 % (ref 0.0–0.2)

## 2021-12-24 LAB — GLUCOSE, CAPILLARY
Glucose-Capillary: 157 mg/dL — ABNORMAL HIGH (ref 70–99)
Glucose-Capillary: 195 mg/dL — ABNORMAL HIGH (ref 70–99)

## 2021-12-24 MED ORDER — CIPROFLOXACIN HCL 250 MG PO TABS: 500.0000 mg | ORAL_TABLET | Freq: Two times a day (BID) | ORAL | Status: DC

## 2021-12-24 MED ORDER — MAGNESIUM SULFATE 2 GM/50ML IV SOLN
2.0000 g | Freq: Once | INTRAVENOUS | Status: AC
  Administered 2021-12-24: 2 g via INTRAVENOUS
  Filled 2021-12-24: qty 50

## 2021-12-24 MED ORDER — POTASSIUM CHLORIDE CRYS ER 20 MEQ PO TBCR
20.0000 meq | EXTENDED_RELEASE_TABLET | Freq: Once | ORAL | Status: AC
  Administered 2021-12-24: 20 meq via ORAL
  Filled 2021-12-24: qty 1

## 2021-12-24 MED ORDER — METRONIDAZOLE 500 MG PO TABS: 500.0000 mg | ORAL_TABLET | Freq: Two times a day (BID) | ORAL | Status: DC

## 2021-12-24 MED ORDER — CIPROFLOXACIN HCL 500 MG PO TABS: 500.0000 mg | ORAL_TABLET | Freq: Two times a day (BID) | ORAL | 0 refills | Status: DC

## 2021-12-24 MED ORDER — METRONIDAZOLE 500 MG PO TABS: 500.0000 mg | ORAL_TABLET | Freq: Two times a day (BID) | ORAL | 0 refills | Status: DC

## 2021-12-24 NOTE — Telephone Encounter (Signed)
Patient seen for rectal bleeding and suspected ischemic colitis, patient needs hospital follow up in the next few weeks. Prefers to see Celso Amy with [facility] in [place] if we could get this arranged for her.   Brooke Bonito, MSN, FNP-BC, AGACNP-BC [facility]

## 2021-12-24 NOTE — Discharge Summary (Signed)
Physician Discharge Summary   Patient: [name] MRN: [id number] DOB: 05/17/1956  Admit date:     12/22/2021  Discharge date: 12/24/21  Discharge Physician: Onalee Hua Ovella Manygoats   PCP: Waldron Session, FNP   Recommendations at discharge:   Please follow up with primary care provider within 1-2 weeks  Please repeat BMP and CBC in one week     Hospital Course: [name] is a 66 y.o. female with medical history significant of DM2, HTN, hypothyroidism, GERD, depression, iron deficiency anemia, osteoarthritis, chronic pain, sigmoidectomy secondary to diverticulosis back in 2012 comes to the hospital with complaints of rectal bleeding.  Patient states yesterday evening she started having abdominal discomfort with mild nausea and vomiting followed by multiple episodes of bloody bowel movement.  She tells me that she has had at least 10+ bowel movements overnight and even while she has been in the ED.  Her last colonoscopy was in 2019 which was overall unremarkable. She denies any fevers and chills.   In the ED she was noted to have leukocytosis.  CT of the abdomen pelvis showed concerns for colonic thickening in the transverse and descending colon suggestive of possible ischemic versus infectious colitis.  Had elevated lactic acid levels.  She was seen by general surgery who recommended conservative management.  GI was also consulted.   Assessment/Plan Principal Problem:   Rectal bleed Active Problems:   Hypokalemia   Hypocalcemia   Colitis   HTN (hypertension)   HLD (hyperlipidemia)   DM2 (diabetes mellitus, type 2) (HCC)   Iron deficiency anemia   Depression     Rectal bleed, lower GI bleed Colitis transverse and descending colon - There is suspicion for ischemic versus infectious colitis.   -Continue current antibiotics of IV Cipro and Flagyl -Continued IV fluid -No surgical invention per surgery -GI following closely -agreed with current management, considering advancing diet,  pending GI intervention at this point -Diet advanced to soft diet 7/14 and patient tolerated  - Stool studies have been ordered, C. difficile negative, stool pathogen panel neg -no further hematochezia x 24 hours -Currently hemoglobin remained stable  -4 more days abx after d/c to finish one week   Hypokalemia/hypocalcemia/Hypomagnesemia - Repletion ordered.    Essential hypertension -Currently home meds on hold.  IV as needed Lopressor and hydralazine ordered  Diabetes mellitus type 2 -Home metformin on hold.  Lactic acid slightly elevated.  As oral tolerance improves this can be slowly resumed.  In the meantime sliding scale and Accu-Cheks -restart metformin after d/c  Hyperlipidemia -Statin  Hypothyroidism -On home Synthroid>>restart  Depression -Home meds>>restart  Iron deficiency anemia -Home meds on hold>>restart  Hypokalemia -repleted          Consultants: GI Procedures performed: none  Disposition: Home Diet recommendation:  Carb modified diet DISCHARGE MEDICATION: Allergies as of 12/24/2021       Reactions   Hydromorphone Shortness Of Breath   Respiratory arrest per patient Other reaction(s): coded with fast admin Note: Imported from external source.   Penicillins Anaphylaxis   Erythromycin Base Nausea And Vomiting   Ibuprofen Other (See Comments)   Per pt, it makes her bleed   Liraglutide    Meperidine Hives   Nsaids    Other reaction(s): GI bleed Note: Imported from external source.        Medication List     TAKE these medications    buPROPion 150 MG 12 hr tablet Commonly known as: WELLBUTRIN SR Take 150 mg by mouth  2 (two) times daily.   busPIRone 10 MG tablet Commonly known as: BUSPAR Take 15 mg by mouth 2 (two) times daily.   Cholecalciferol 25 MCG (1000 UT) tablet Take 1,000 Units by mouth daily.   ciprofloxacin 500 MG tablet Commonly known as: CIPRO Take 1 tablet (500 mg total) by mouth 2 (two) times daily.    COLLAGEN PO Take 6,000 mg by mouth daily.   cyanocobalamin 2000 MCG tablet Take 2,000 mcg by mouth daily.   estradiol 0.5 MG tablet Commonly known as: ESTRACE Take 0.5 mg by mouth daily.   ferrous gluconate 324 MG tablet Commonly known as: FERGON Take 324 mg by mouth daily with breakfast.   fluconazole 150 MG tablet Commonly known as: DIFLUCAN Take 150 mg by mouth once a week.   FLUoxetine 40 MG capsule Commonly known as: PROzac Take 1 capsule (40 mg total) by mouth daily.   furosemide 20 MG tablet Commonly known as: LASIX Take 20 mg by mouth every Monday, Wednesday, and Friday.   gabapentin 600 MG tablet Commonly known as: NEURONTIN Take 600 mg by mouth in the morning, at noon, in the evening, and at bedtime.   hydrOXYzine 25 MG tablet Commonly known as: ATARAX Take 25 mg by mouth 3 (three) times daily.   insulin aspart 100 UNIT/ML injection Commonly known as: novoLOG Inject 5-8 Units into the skin 3 (three) times daily before meals.   levocetirizine 5 MG tablet Commonly known as: XYZAL Take 5 mg by mouth every evening.   levothyroxine 175 MCG tablet Commonly known as: SYNTHROID Take 175 mcg by mouth daily before breakfast.   LORazepam 0.5 MG tablet Commonly known as: ATIVAN Take 1 tablet (0.5 mg total) by mouth 2 (two) times daily.   lovastatin 40 MG tablet Commonly known as: MEVACOR Take 40 mg by mouth at bedtime.   meclizine 25 MG tablet Commonly known as: ANTIVERT Take 25 mg by mouth 3 (three) times daily as needed for dizziness.   metFORMIN 500 MG tablet Commonly known as: GLUCOPHAGE Take 500 mg by mouth daily as needed (sugar).   metroNIDAZOLE 500 MG tablet Commonly known as: FLAGYL Take 1 tablet (500 mg total) by mouth every 12 (twelve) hours.   montelukast 10 MG tablet Commonly known as: SINGULAIR Take 10 mg by mouth daily.   ondansetron 4 MG tablet Commonly known as: ZOFRAN Take 4 mg by mouth 3 (three) times daily as needed for  refractory nausea / vomiting.   oxybutynin 5 MG tablet Commonly known as: DITROPAN Take 5 mg by mouth daily.   oxyCODONE 5 MG immediate release tablet Commonly known as: Oxy IR/ROXICODONE Take 1-2 tablets (5-10 mg total) by mouth every 6 (six) hours as needed for breakthrough pain. What changed: how much to take   pantoprazole 40 MG tablet Commonly known as: PROTONIX Take 40 mg by mouth daily.   rizatriptan 10 MG tablet Commonly known as: MAXALT Take 10 mg by mouth every 6 (six) hours as needed for migraine. May repeat in 2 hours if needed   Soliqua 100-33 UNT-MCG/ML Sopn Generic drug: Insulin Glargine-Lixisenatide Inject 32 Units into the skin at bedtime.   tiZANidine 4 MG capsule Commonly known as: ZANAFLEX Take 4 mg by mouth in the morning and at bedtime.   traZODone 50 MG tablet Commonly known as: DESYREL Take 50 mg by mouth at bedtime.   valACYclovir 500 MG tablet Commonly known as: VALTREX Take 500 mg by mouth 2 (two) times daily as needed (shingles).  zinc gluconate 50 MG tablet Take 50 mg by mouth daily.        Discharge Exam: Filed Weights   12/22/21 0726 12/22/21 1409  Weight: 96.1 kg 95.1 kg   HEENT:  [place]/AT, No thrush, no icterus CV:  RRR, no rub, no S3, no S4 Lung:  CTA, no wheeze, no rhonchi Abd:  soft/+BS, NT Ext:  No edema, no lymphangitis, no synovitis, no rash   Condition at discharge: stable  The results of significant diagnostics from this hospitalization (including imaging, microbiology, ancillary and laboratory) are listed below for reference.   Imaging Studies: CT Angio Abd/Pel W and/or Wo Contrast  Result Date: 12/22/2021 CLINICAL DATA:  GI bleed, lower Abdomen pain and n/v with blood x 1 day. EXAM: CTA ABDOMEN AND PELVIS WITHOUT AND WITH CONTRAST TECHNIQUE: Multidetector CT imaging of the abdomen and pelvis was performed using the standard protocol during bolus administration of intravenous contrast. Multiplanar reconstructed  images and MIPs were obtained and reviewed to evaluate the vascular anatomy. RADIATION DOSE REDUCTION: This exam was performed according to the departmental dose-optimization program which includes automated exposure control, adjustment of the mA and/or kV according to patient size and/or use of iterative reconstruction technique. CONTRAST:  100mL OMNIPAQUE IOHEXOL 350 MG/ML SOLN COMPARISON:  CT AP, 03/21/2014 (report) and 02/23/2007. KUB, 10/30/2020. FINDINGS: VASCULAR Aorta: Normal caliber aorta without aneurysm, dissection, vasculitis or significant stenosis. Celiac: Widely patent without evidence of aneurysm, dissection, vasculitis or significant stenosis. SMA: Widely patent without evidence of aneurysm, dissection, vasculitis or significant stenosis. Renals: A single LEFT and 2 RIGHT renal arteries are present. The renal arteries are widely patent without evidence of aneurysm, dissection, vasculitis, fibromuscular dysplasia or significant stenosis. IMA: Widely patent without evidence of aneurysm, dissection, vasculitis or significant stenosis. Distal IMA territory branches are opacified, to the technical extent of this evaluation. Pelvis: Mild aortic bifurcation and iliac artery atherosclerosis, otherwise patent without evidence of aneurysm, dissection, vasculitis or significant stenosis. Proximal Outflow: Bilateral common femoral and visualized portions of the superficial and profunda femoral arteries are patent without evidence of aneurysm, dissection, vasculitis or significant stenosis. Veins: No obvious venous abnormality within the limitations of this arterial phase study. Review of the MIP images confirms the above findings. NON-VASCULAR Lower chest: No acute abnormality. Hepatobiliary: No focal liver abnormality is seen. Status post cholecystectomy. No biliary dilatation. Pancreas: No pancreatic ductal dilatation or surrounding inflammatory changes. Spleen: Normal in size without focal abnormality.  Adrenals/Urinary Tract: Adrenal glands are unremarkable. Kidneys are normal, without renal calculi, focal lesion, or hydronephrosis. Bladder is unremarkable. Stomach/Bowel: *Stomach is within normal limits. Appendix is not definitively visualized, likely surgically absent. *Moderate amount of colonic mucosal thickening and pericolonic stranding involving the mid to distal transverse colon and extending to the mid to distal descending colon, in a IMA vascular territorial distribution *Postsurgical changes of sigmoidectomy with intact anastomosis. *Mild air and fluid distention colon proximal to the anastomosis without overt colonic obstruction. Lymphatic: No enlarged abdominal or pelvic lymph nodes. Reproductive: Status post hysterectomy. No adnexal masses. Other: No abdominal wall hernia or abnormality. No abdominopelvic ascites. Musculoskeletal: RIGHT hip arthroplasty with proud acetabular fixation screw extending into the RIGHT iliacus. Degenerative changes of the spine. No acute osseous findings. IMPRESSION: VASCULAR 1. No acute vascular abnormality within the abdomen or pelvis or evidence of acute GI bleed. 2. Distal IMA territory branches are opacified, to the technical extent of this evaluation. NON-VASCULAR 1. Colonic mucosal thickening and pericolonic stranding involving the distal transverse to descending colon. Differential  diagnosis includes mesenteric ischemia versus colitis, more likely. 2. Postsurgical changes of sigmoidectomy with minimal colonic distention proximal to the anastomosis. No overt colonic obstruction. Additional incidental, chronic and senescent findings as above. Electronically Signed   By: Roanna Banning M.D.   On: 12/22/2021 09:26    Microbiology: Results for orders placed or performed during the hospital encounter of 12/22/21  C Difficile Quick Screen w PCR reflex     Status: None   Collection Time: 12/22/21 12:03 PM   Specimen: STOOL  Result Value Ref Range Status   C Diff  antigen NEGATIVE NEGATIVE Final   C Diff toxin NEGATIVE NEGATIVE Final   C Diff interpretation No C. difficile detected.  Final    Comment: Performed at [facility], [address], [place], [place] [address]  Gastrointestinal Panel by PCR , Stool     Status: None   Collection Time: 12/22/21 12:04 PM   Specimen: STOOL  Result Value Ref Range Status   Campylobacter species NOT DETECTED NOT DETECTED Final   Plesimonas shigelloides NOT DETECTED NOT DETECTED Final   Salmonella species NOT DETECTED NOT DETECTED Final   Yersinia enterocolitica NOT DETECTED NOT DETECTED Final   Vibrio species NOT DETECTED NOT DETECTED Final   Vibrio cholerae NOT DETECTED NOT DETECTED Final   Enteroaggregative E coli (EAEC) NOT DETECTED NOT DETECTED Final   Enteropathogenic E coli (EPEC) NOT DETECTED NOT DETECTED Final   Enterotoxigenic E coli (ETEC) NOT DETECTED NOT DETECTED Final   Shiga like toxin producing E coli (STEC) NOT DETECTED NOT DETECTED Final   Shigella/Enteroinvasive E coli (EIEC) NOT DETECTED NOT DETECTED Final   Cryptosporidium NOT DETECTED NOT DETECTED Final   Cyclospora cayetanensis NOT DETECTED NOT DETECTED Final   Entamoeba histolytica NOT DETECTED NOT DETECTED Final   Giardia lamblia NOT DETECTED NOT DETECTED Final   Adenovirus F40/41 NOT DETECTED NOT DETECTED Final   Astrovirus NOT DETECTED NOT DETECTED Final   Norovirus GI/GII NOT DETECTED NOT DETECTED Final   Rotavirus A NOT DETECTED NOT DETECTED Final   Sapovirus (I, II, IV, and V) NOT DETECTED NOT DETECTED Final    Comment: Performed at [facility], [address] [place], [place] [address]    Labs: CBC: Recent Labs  Lab 12/22/21 0800 12/22/21 0805 12/23/21 0527 12/24/21 0452  WBC 13.7*  --  13.9* 12.4*  NEUTROABS 11.2*  --   --   --   HGB 15.2* 15.6* 13.7 12.8  HCT 45.4 46.0 43.5 40.1  MCV 88.8  --  92.0 92.0  PLT 204  --  179 182   Basic Metabolic Panel: Recent Labs  Lab 12/22/21 0800 12/22/21 0805  12/23/21 0527 12/24/21 0452  NA 137 139 137 138  K 3.2* 3.3* 3.3* 3.4*  CL 101 99 104 105  CO2 26  --  26 26  GLUCOSE 245* 244* 217* 159*  BUN 18 17 9  7*  CREATININE 0.82 0.60 0.53 0.56  CALCIUM 8.7*  --  8.4* 8.4*  MG  --   --  1.5*  --   PHOS  --   --  2.4*  --    Liver Function Tests: Recent Labs  Lab 12/22/21 0800 12/23/21 0527  AST 20 16  ALT 18 15  ALKPHOS 64 54  BILITOT 0.6 0.6  PROT 6.3* 5.8*  ALBUMIN 3.3* 2.8*   CBG: Recent Labs  Lab 12/23/21 1128 12/23/21 1620 12/23/21 2142 12/24/21 0739 12/24/21 1141  GLUCAP 217* 181* 180* 157* 195*  Discharge time spent: greater than 30 minutes.  Signed: Catarina Hartshorn, MD [facility] 12/24/2021

## 2021-12-24 NOTE — Progress Notes (Signed)
[facility] Progress Note     Subjective: Patient seen and examined.  She is resting comfortably in bed.  Her abdominal pain is significantly improved.  She denies nausea and vomiting.  She is tolerating a full liquid diet without issue.  She denies any further blood in her bowel movements.  Objective: Vital signs in last 24 hours: Temp:  [98.6 F (37 C)-98.7 F (37.1 C)] 98.7 F (37.1 C) (07/14 0447) Pulse Rate:  [71-76] 72 (07/14 0447) Resp:  [18-20] 20 (07/14 0447) BP: (121-142)/(67-70) 121/70 (07/14 0447) SpO2:  [96 %-97 %] 96 % (07/14 0447) Last BM Date : 12/22/21 (only passign mucus now)  Intake/Output from previous day: 07/13 0701 - 07/14 0700 In: 1406.1 [P.O.:480; IV Piggyback:926.1] Out: -  Intake/Output this shift: No intake/output data recorded.  General appearance: alert, cooperative, and no distress GI: Abdomen soft, nondistended, no percussion tenderness, mild tenderness to palpation in periumbilical region; no rigidity, guarding, rebound tenderness  Lab Results:  Recent Labs    12/23/21 0527 12/24/21 0452  WBC 13.9* 12.4*  HGB 13.7 12.8  HCT 43.5 40.1  PLT 179 182   BMET Recent Labs    12/23/21 0527 12/24/21 0452  NA 137 138  K 3.3* 3.4*  CL 104 105  CO2 26 26  GLUCOSE 217* 159*  BUN 9 7*  CREATININE 0.53 0.56  CALCIUM 8.4* 8.4*   PT/INR Recent Labs    12/22/21 0800 12/23/21 0527  LABPROT 14.0 14.9  INR 1.1 1.2    Studies/Results: No results found.  Anti-infectives: Anti-infectives (From admission, onward)    Start     Dose/Rate Route Frequency Ordered Stop   12/22/21 1000  metroNIDAZOLE (FLAGYL) IVPB 500 mg        500 mg 100 mL/hr over 60 Minutes Intravenous Every 8 hours 12/22/21 0958     12/22/21 1000  ciprofloxacin (CIPRO) IVPB 400 mg        400 mg 200 mL/hr over 60 Minutes Intravenous Every 12 hours 12/22/21 0958         Assessment/Plan:  Patient is a 66 year old female who was admitted with concern  for ischemic colitis.  -Leukocytosis slightly improved to 12.4 from 13.9 -Continue IV antibiotics -If patient tolerates full liquid diet this AM, diet may be advanced to GI soft -IV fluids per primary team -Appreciate GI recommendations -No acute surgical intervention at this time -Once patient tolerates a soft diet, she will be stable from a general surgery standpoint.  Would recommend discharge home with PO antibiotics -Care per primary team   LOS: 1 day    Johneric Mcfadden A Zeeva Courser 12/24/2021

## 2021-12-24 NOTE — Progress Notes (Cosign Needed)
Gastroenterology Progress Note   Referring Provider: No ref. provider found Primary Care Physician:  Waldron Session, FNP Primary Gastroenterologist:  Celso Amy, PA [facility])  Patient ID: [name]; [id number]; 25-Feb-1956    Subjective   Patient reports feeling well today. Denies nausea, abdominal pain, vomiting, or any rectal bleeding. Last episode of rectal bleeding was last night. She tolerated soft diet for lunch asnd is ready for discharge.    Objective   Vital signs in last 24 hours Temp:  [98.6 F (37 C)-98.7 F (37.1 C)] 98.7 F (37.1 C) (07/14 1320) Pulse Rate:  [72-76] 75 (07/14 1320) Resp:  [19-20] 20 (07/14 1320) BP: (121-142)/(70-72) 132/72 (07/14 1320) SpO2:  [96 %-97 %] 97 % (07/14 1320) Last BM Date : 12/22/21 (only passign mucus now)  Physical Exam General:   Alert and oriented, pleasant Head:  Normocephalic and atraumatic. Eyes:  No icterus, sclera clear. Conjuctiva pink.  Mouth:  Without lesions, mucosa pink and moist. .  Msk:  Symmetrical without gross deformities. Normal posture. Extremities:  Without clubbing or edema. Neurologic:  Alert and  oriented x4;  grossly normal neurologically. Skin:  Warm and dry, intact without significant lesions.  Psych:  Alert and cooperative. Normal mood and affect.  Intake/Output from previous day: 07/13 0701 - 07/14 0700 In: 1406.1 [P.O.:480; IV Piggyback:926.1] Out: -  Intake/Output this shift: No intake/output data recorded.  Lab Results  Recent Labs    12/22/21 0800 12/22/21 0805 12/23/21 0527 12/24/21 0452  WBC 13.7*  --  13.9* 12.4*  HGB 15.2* 15.6* 13.7 12.8  HCT 45.4 46.0 43.5 40.1  PLT 204  --  179 182   BMET Recent Labs    12/22/21 0800 12/22/21 0805 12/23/21 0527 12/24/21 0452  NA 137 139 137 138  K 3.2* 3.3* 3.3* 3.4*  CL 101 99 104 105  CO2 26  --  26 26  GLUCOSE 245* 244* 217* 159*  BUN 18 17 9  7*  CREATININE 0.82 0.60 0.53 0.56  CALCIUM 8.7*  --  8.4* 8.4*    LFT Recent Labs    12/22/21 0800 12/23/21 0527  PROT 6.3* 5.8*  ALBUMIN 3.3* 2.8*  AST 20 16  ALT 18 15  ALKPHOS 64 54  BILITOT 0.6 0.6   PT/INR Recent Labs    12/22/21 0800 12/23/21 0527  LABPROT 14.0 14.9  INR 1.1 1.2   Hepatitis Panel No results for input(s): "HEPBSAG", "HCVAB", "HEPAIGM", "HEPBIGM" in the last 72 hours.   Studies/Results CT Angio Abd/Pel W and/or Wo Contrast  Result Date: 12/22/2021 CLINICAL DATA:  GI bleed, lower Abdomen pain and n/v with blood x 1 day. EXAM: CTA ABDOMEN AND PELVIS WITHOUT AND WITH CONTRAST TECHNIQUE: Multidetector CT imaging of the abdomen and pelvis was performed using the standard protocol during bolus administration of intravenous contrast. Multiplanar reconstructed images and MIPs were obtained and reviewed to evaluate the vascular anatomy. RADIATION DOSE REDUCTION: This exam was performed according to the departmental dose-optimization program which includes automated exposure control, adjustment of the mA and/or kV according to patient size and/or use of iterative reconstruction technique. CONTRAST:  02/22/2022 OMNIPAQUE IOHEXOL 350 MG/ML SOLN COMPARISON:  CT AP, 03/21/2014 (report) and 02/23/2007. KUB, 10/30/2020. FINDINGS: VASCULAR Aorta: Normal caliber aorta without aneurysm, dissection, vasculitis or significant stenosis. Celiac: Widely patent without evidence of aneurysm, dissection, vasculitis or significant stenosis. SMA: Widely patent without evidence of aneurysm, dissection, vasculitis or significant stenosis. Renals: A single LEFT and 2 RIGHT renal arteries are  present. The renal arteries are widely patent without evidence of aneurysm, dissection, vasculitis, fibromuscular dysplasia or significant stenosis. IMA: Widely patent without evidence of aneurysm, dissection, vasculitis or significant stenosis. Distal IMA territory branches are opacified, to the technical extent of this evaluation. Pelvis: Mild aortic bifurcation and iliac  artery atherosclerosis, otherwise patent without evidence of aneurysm, dissection, vasculitis or significant stenosis. Proximal Outflow: Bilateral common femoral and visualized portions of the superficial and profunda femoral arteries are patent without evidence of aneurysm, dissection, vasculitis or significant stenosis. Veins: No obvious venous abnormality within the limitations of this arterial phase study. Review of the MIP images confirms the above findings. NON-VASCULAR Lower chest: No acute abnormality. Hepatobiliary: No focal liver abnormality is seen. Status post cholecystectomy. No biliary dilatation. Pancreas: No pancreatic ductal dilatation or surrounding inflammatory changes. Spleen: Normal in size without focal abnormality. Adrenals/Urinary Tract: Adrenal glands are unremarkable. Kidneys are normal, without renal calculi, focal lesion, or hydronephrosis. Bladder is unremarkable. Stomach/Bowel: *Stomach is within normal limits. Appendix is not definitively visualized, likely surgically absent. *Moderate amount of colonic mucosal thickening and pericolonic stranding involving the mid to distal transverse colon and extending to the mid to distal descending colon, in a IMA vascular territorial distribution *Postsurgical changes of sigmoidectomy with intact anastomosis. *Mild air and fluid distention colon proximal to the anastomosis without overt colonic obstruction. Lymphatic: No enlarged abdominal or pelvic lymph nodes. Reproductive: Status post hysterectomy. No adnexal masses. Other: No abdominal wall hernia or abnormality. No abdominopelvic ascites. Musculoskeletal: RIGHT hip arthroplasty with proud acetabular fixation screw extending into the RIGHT iliacus. Degenerative changes of the spine. No acute osseous findings. IMPRESSION: VASCULAR 1. No acute vascular abnormality within the abdomen or pelvis or evidence of acute GI bleed. 2. Distal IMA territory branches are opacified, to the technical extent  of this evaluation. NON-VASCULAR 1. Colonic mucosal thickening and pericolonic stranding involving the distal transverse to descending colon. Differential diagnosis includes mesenteric ischemia versus colitis, more likely. 2. Postsurgical changes of sigmoidectomy with minimal colonic distention proximal to the anastomosis. No overt colonic obstruction. Additional incidental, chronic and senescent findings as above. Electronically Signed   By: Roanna Banning M.D.   On: 12/22/2021 09:26    Assessment  66 y.o. female with a history of GERD, diabetes, anxiety/depression, chronic pain, asthma, HLD, HTN, vaginal fistula sigmoid colon who presented to the ED on 7/12 with abdominal pain, nausea, vomiting, and hematochezia.  CT angio concerning for possible mesenteric ischemia versus colitis.  GI consulted for further evaluation.  Suspected ischemic colitis: CTA A/P with mucosal thickening and pericolonic stranding involving the distal transverse to descending colon, no evidence of significant mesenteric occlusive disease.  Last colonoscopy was in 2019.  C. difficile and GI pathogen panel negative.  Hemoglobin has remained stable, slight drop from 13.7-12.8 today.  Improving leukocytosis with WBC 12.4. General surgery has been following. Has continued to improve, now without abdominal pain and has tolerated soft diet at lunch. No episodes of rectal bleeding since last night.    Plan / Recommendations  Continue supportive measures Advance to soft diet Recommend outpatient colonoscopy with primary GI ([facility]) once symptoms have resolved. Continue abx per general surgery recommendations.     LOS: 1 day    12/24/2021, 2:26 PM   Brooke Bonito, MSN, FNP-BC, AGACNP-BC [facility]

## 2021-12-27 ENCOUNTER — Encounter: Payer: Self-pay | Admitting: Adult Health

## 2021-12-27 ENCOUNTER — Telehealth (INDEPENDENT_AMBULATORY_CARE_PROVIDER_SITE_OTHER): Payer: Medicare HMO | Admitting: Adult Health

## 2021-12-27 DIAGNOSIS — F331 Major depressive disorder, recurrent, moderate: Secondary | ICD-10-CM

## 2021-12-27 DIAGNOSIS — F411 Generalized anxiety disorder: Secondary | ICD-10-CM

## 2021-12-27 DIAGNOSIS — G4700 Insomnia, unspecified: Secondary | ICD-10-CM | POA: Diagnosis not present

## 2021-12-27 DIAGNOSIS — F4310 Post-traumatic stress disorder, unspecified: Secondary | ICD-10-CM | POA: Diagnosis not present

## 2021-12-27 DIAGNOSIS — F410 Panic disorder [episodic paroxysmal anxiety] without agoraphobia: Secondary | ICD-10-CM

## 2021-12-27 MED ORDER — TRAZODONE HCL 50 MG PO TABS: 50.0000 mg | ORAL_TABLET | Freq: Every day | ORAL | 5 refills | Status: DC

## 2021-12-27 MED ORDER — FLUOXETINE HCL 40 MG PO CAPS: 40.0000 mg | ORAL_CAPSULE | Freq: Every day | ORAL | 5 refills | Status: DC

## 2021-12-27 MED ORDER — BUPROPION HCL ER (SR) 150 MG PO TB12: 150.0000 mg | ORAL_TABLET | Freq: Two times a day (BID) | ORAL | 5 refills | Status: DC

## 2021-12-27 MED ORDER — LORAZEPAM 0.5 MG PO TABS: 0.5000 mg | ORAL_TABLET | Freq: Two times a day (BID) | ORAL | 2 refills | Status: DC

## 2021-12-27 MED ORDER — BUSPIRONE HCL 10 MG PO TABS: 15.0000 mg | ORAL_TABLET | Freq: Two times a day (BID) | ORAL | 5 refills | Status: DC

## 2021-12-27 NOTE — Telephone Encounter (Signed)
Noted  

## 2021-12-27 NOTE — Progress Notes (Signed)
[name] [id number] 1956/04/02 66 y.o.  Virtual Visit via Video Note  I connected with pt @ on 12/27/21 at  2:00 PM EDT by a video enabled telemedicine application and verified that I am speaking with the correct person using two identifiers.   I discussed the limitations of evaluation and management by telemedicine and the availability of in person appointments. The patient expressed understanding and agreed to proceed.  I discussed the assessment and treatment plan with the patient. The patient was provided an opportunity to ask questions and all were answered. The patient agreed with the plan and demonstrated an understanding of the instructions.   The patient was advised to call back or seek an in-person evaluation if the symptoms worsen or if the condition fails to improve as anticipated.  I provided 25 minutes of non-face-to-face time during this encounter.  The patient was located at home.  The provider was located at [facility].   Dorothyann Gibbs, NP   Subjective:   Patient ID:  Tapia Melanie is a 66 y.o. (DOB June 04, 1956) female.  Chief Complaint: No chief complaint on file.   HPI Darriana B Benda presents for follow-up of MDD, [name], [name], insomnia and panic attacks.  Describes mood today as "not the best". Pleasant. Tearful at times. Mood symptoms - reports depression, anxiety, and irritability - feels like increase in Prozac has been helpful. Reports worry and rumination. Reports over thinking. Reports panic attacks - reports 3 since last visit. Mood has been lower with recent hospitalization - 3 days - colitis - GI bleed. Recently discharged - missed 6 doses of medications. Stating "I'm hanging in there". Varying interest and motivation. Taking medications as prescribed. Followed for multiple medical issues. Energy levels lower. Active, does not have a regular exercise routine with physical disabilities. Enjoys some usual interests and activities. Divorced  from husband of 40 years, but lives with ex-husband and his mother - 32 years old. Has a dog - "[name]" 4 years. Has 3 grown children.  Spending time with family. Appetite adequate. Weight loss - 214 from 209 while hospitalized - 63". Sleeping issues with recent hospitalization. Doing better since returning home. Averages 7 hours. Focus and concentration stable. Completing tasks. Managing aspects of household. 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  Denies AH or VH. Reporting some paranoia - happens when she gets too close to too many people at once - being judged.  Planning to restart therapy.  Chronic pain - working with pain management.  Previous medication trials: Cymbalta, Prozac  Review of Systems:  Review of Systems  Musculoskeletal:  Negative for gait problem.  Neurological:  Negative for tremors.  Psychiatric/Behavioral:         Please refer to HPI    Medications: I have reviewed the patient's current medications.  Current Outpatient Medications  Medication Sig Dispense Refill   buPROPion (WELLBUTRIN SR) 150 MG 12 hr tablet Take 150 mg by mouth 2 (two) times daily.     busPIRone (BUSPAR) 10 MG tablet Take 15 mg by mouth 2 (two) times daily.     Cholecalciferol 25 MCG (1000 UT) tablet Take 1,000 Units by mouth daily.     ciprofloxacin (CIPRO) 500 MG tablet Take 1 tablet (500 mg total) by mouth 2 (two) times daily. 8 tablet 0   COLLAGEN PO Take 6,000 mg by mouth daily.     cyanocobalamin 2000 MCG tablet Take 2,000 mcg by mouth daily.  estradiol (ESTRACE) 0.5 MG tablet Take 0.5 mg by mouth daily.     ferrous gluconate (FERGON) 324 MG tablet Take 324 mg by mouth daily with breakfast.     fluconazole (DIFLUCAN) 150 MG tablet Take 150 mg by mouth once a week.     FLUoxetine (PROZAC) 40 MG capsule Take 1 capsule (40 mg total) by mouth daily. 30 capsule 2   furosemide (LASIX) 20 MG tablet Take 20 mg by mouth every Monday, Wednesday, and Friday.      gabapentin (NEURONTIN) 600 MG tablet Take 600 mg by mouth in the morning, at noon, in the evening, and at bedtime.     hydrOXYzine (ATARAX) 25 MG tablet Take 25 mg by mouth 3 (three) times daily.     insulin aspart (NOVOLOG) 100 UNIT/ML injection Inject 5-8 Units into the skin 3 (three) times daily before meals.     Insulin Glargine-Lixisenatide (SOLIQUA) 100-33 UNT-MCG/ML SOPN Inject 32 Units into the skin at bedtime.     levocetirizine (XYZAL) 5 MG tablet Take 5 mg by mouth every evening.     levothyroxine (SYNTHROID) 175 MCG tablet Take 175 mcg by mouth daily before breakfast.     LORazepam (ATIVAN) 0.5 MG tablet Take 1 tablet (0.5 mg total) by mouth 2 (two) times daily. 60 tablet 2   lovastatin (MEVACOR) 40 MG tablet Take 40 mg by mouth at bedtime.     meclizine (ANTIVERT) 25 MG tablet Take 25 mg by mouth 3 (three) times daily as needed for dizziness.     metFORMIN (GLUCOPHAGE) 500 MG tablet Take 500 mg by mouth daily as needed (sugar).     metroNIDAZOLE (FLAGYL) 500 MG tablet Take 1 tablet (500 mg total) by mouth every 12 (twelve) hours. 8 tablet 0   montelukast (SINGULAIR) 10 MG tablet Take 10 mg by mouth daily.     ondansetron (ZOFRAN) 4 MG tablet Take 4 mg by mouth 3 (three) times daily as needed for refractory nausea / vomiting.     oxybutynin (DITROPAN) 5 MG tablet Take 5 mg by mouth daily.     oxyCODONE (OXY IR/ROXICODONE) 5 MG immediate release tablet Take 1-2 tablets (5-10 mg total) by mouth every 6 (six) hours as needed for breakthrough pain. (Patient taking differently: Take 15 mg by mouth every 6 (six) hours as needed for breakthrough pain.) 28 tablet 0   pantoprazole (PROTONIX) 40 MG tablet Take 40 mg by mouth daily.     rizatriptan (MAXALT) 10 MG tablet Take 10 mg by mouth every 6 (six) hours as needed for migraine. May repeat in 2 hours if needed     tiZANidine (ZANAFLEX) 4 MG capsule Take 4 mg by mouth in the morning and at bedtime.     traZODone (DESYREL) 50 MG tablet Take  50 mg by mouth at bedtime.     valACYclovir (VALTREX) 500 MG tablet Take 500 mg by mouth 2 (two) times daily as needed (shingles).     zinc gluconate 50 MG tablet Take 50 mg by mouth daily.     No current facility-administered medications for this visit.    Medication Side Effects: None  Allergies:  Allergies  Allergen Reactions   Hydromorphone Shortness Of Breath    Respiratory arrest per patient Other reaction(s): coded with fast admin Note: Imported from external source.   Penicillins Anaphylaxis   Erythromycin Base Nausea And Vomiting   Ibuprofen Other (See Comments)    Per pt, it makes her bleed   Liraglutide  Meperidine Hives   Nsaids     Other reaction(s): GI bleed Note: Imported from external source.    Past Medical History:  Diagnosis Date   Anxiety    Asthma    Chronic pain    Depression    Diabetes mellitus without complication (HCC)    GERD (gastroesophageal reflux disease)    Headache    migraines   Hyperlipidemia    Hypertension    Hypothyroidism    Osteoarthritis    PONV (postoperative nausea and vomiting)     No family history on file.  Social History   Socioeconomic History   Marital status: Married    Spouse name: Not on file   Number of children: Not on file   Years of education: Not on file   Highest education level: Not on file  Occupational History   Not on file  Tobacco Use   Smoking status: Never    Passive exposure: Never   Smokeless tobacco: Never  Vaping Use   Vaping Use: Never used  Substance and Sexual Activity   Alcohol use: Never   Drug use: Never   Sexual activity: Not Currently    Birth control/protection: None  Other Topics Concern   Not on file  Social History Narrative   Not on file   Social Determinants of Health   Financial Resource Strain: Not on file  Food Insecurity: Not on file  Transportation Needs: Not on file  Physical Activity: Not on file  Stress: Not on file  Social Connections: Not on  file  Intimate Partner Violence: Not on file    Past Medical History, Surgical history, Social history, and Family history were reviewed and updated as appropriate.   Please see review of systems for further details on the patient's review from today.   Objective:   Physical Exam:  There were no vitals taken for this visit.  Physical Exam Constitutional:      General: She is not in acute distress. Musculoskeletal:        General: No deformity.  Neurological:     Mental Status: She is alert and oriented to person, place, and time.     Coordination: Coordination normal.  Psychiatric:        Attention and Perception: Attention and perception normal. She does not perceive auditory or visual hallucinations.        Mood and Affect: Mood normal. Mood is not anxious or depressed. Affect is not labile, blunt, angry or inappropriate.        Speech: Speech normal.        Behavior: Behavior normal.        Thought Content: Thought content normal. Thought content is not paranoid or delusional. Thought content does not include homicidal or suicidal ideation. Thought content does not include homicidal or suicidal plan.        Cognition and Memory: Cognition and memory normal.        Judgment: Judgment normal.     Comments: Insight intact     Lab Review:     Component Value Date/Time   NA 138 12/24/2021 0452   K 3.4 (L) 12/24/2021 0452   CL 105 12/24/2021 0452   CO2 26 12/24/2021 0452   GLUCOSE 159 (H) 12/24/2021 0452   BUN 7 (L) 12/24/2021 0452   CREATININE 0.56 12/24/2021 0452   CALCIUM 8.4 (L) 12/24/2021 0452   PROT 5.8 (L) 12/23/2021 0527   ALBUMIN 2.8 (L) 12/23/2021 0527   AST 16 12/23/2021 0527  ALT 15 12/23/2021 0527   ALKPHOS 54 12/23/2021 0527   BILITOT 0.6 12/23/2021 0527   GFRNONAA >60 12/24/2021 0452       Component Value Date/Time   WBC 12.4 (H) 12/24/2021 0452   RBC 4.36 12/24/2021 0452   HGB 12.8 12/24/2021 0452   HCT 40.1 12/24/2021 0452   PLT 182 12/24/2021  0452   MCV 92.0 12/24/2021 0452   MCH 29.4 12/24/2021 0452   MCHC 31.9 12/24/2021 0452   RDW 12.9 12/24/2021 0452   LYMPHSABS 1.6 12/22/2021 0800   MONOABS 0.8 12/22/2021 0800   EOSABS 0.0 12/22/2021 0800   BASOSABS 0.1 12/22/2021 0800    No results found for: "POCLITH", "LITHIUM"   No results found for: "PHENYTOIN", "PHENOBARB", "VALPROATE", "CBMZ"   .res Assessment: Plan:    Plan:  PDMP reviewed  Buspar 15mg  BID Wellbutrin SR 150mg  BID Trazadone 50mg  at bedtime Ativan 0.5mg  BID prn as needed Prozac 40mg  daily   Time spent with patient was 25 minutes. Greater than 50% of face to face time with patient was spent on counseling and coordination of care.    RTC 4 weeks  Patient advised to contact office with any questions, adverse effects, or acute worsening in signs and symptoms.   Discussed potential benefits, risk, and side effects of benzodiazepines to include potential risk of tolerance and dependence, as well as possible drowsiness.  Advised patient not to drive if experiencing drowsiness and to take lowest possible effective dose to minimize risk of dependence and tolerance.  There are no diagnoses linked to this encounter.   Please see After Visit Summary for patient specific instructions.  Future Appointments  Date Time Provider Department Center  12/27/2021  2:00 PM Sianni Cloninger, , NP [facility] None    No orders of the defined types were placed in this encounter.     -------------------------------

## 2021-12-27 NOTE — Telephone Encounter (Signed)
Notes sent to [facility] for HFU

## 2022-01-26 ENCOUNTER — Encounter: Payer: Self-pay | Admitting: Adult Health

## 2022-01-26 ENCOUNTER — Telehealth (INDEPENDENT_AMBULATORY_CARE_PROVIDER_SITE_OTHER): Payer: Medicare HMO | Admitting: Adult Health

## 2022-01-26 DIAGNOSIS — F331 Major depressive disorder, recurrent, moderate: Secondary | ICD-10-CM | POA: Diagnosis not present

## 2022-01-26 DIAGNOSIS — F410 Panic disorder [episodic paroxysmal anxiety] without agoraphobia: Secondary | ICD-10-CM | POA: Diagnosis not present

## 2022-01-26 DIAGNOSIS — F4310 Post-traumatic stress disorder, unspecified: Secondary | ICD-10-CM

## 2022-01-26 DIAGNOSIS — F411 Generalized anxiety disorder: Secondary | ICD-10-CM

## 2022-01-26 DIAGNOSIS — G4700 Insomnia, unspecified: Secondary | ICD-10-CM

## 2022-01-26 MED ORDER — REXULTI 0.5 MG PO TABS: 0.5000 mg | ORAL_TABLET | Freq: Every day | ORAL | 2 refills | Status: DC

## 2022-01-26 NOTE — Progress Notes (Signed)
LORE POLKA [id number] 05/28/1956 66 y.o.  Virtual Visit via Video Note  I connected with pt @ on 01/26/22 at  2:00 PM EDT by a video enabled telemedicine application and verified that I am speaking with the correct person using two identifiers.   I discussed the limitations of evaluation and management by telemedicine and the availability of in person appointments. The patient expressed understanding and agreed to proceed.  I discussed the assessment and treatment plan with the patient. The patient was provided an opportunity to ask questions and all were answered. The patient agreed with the plan and demonstrated an understanding of the instructions.   The patient was advised to call back or seek an in-person evaluation if the symptoms worsen or if the condition fails to improve as anticipated.  I provided 25 minutes of non-face-to-face time during this encounter.  The patient was located at home.  The provider was located at [facility].   Dorothyann Gibbs, NP   Subjective:   Patient ID:  BITANIA SHANKLAND is a 66 y.o. (DOB 10-23-1955) female.  Chief Complaint: No chief complaint on file.   HPI Ashawnti B Opfer presents for follow-up of MDD, [name], [name], insomnia and panic attacks.  Describes mood today as "not to good". Pleasant. Tearful at times. Mood symptoms - reports depression, anxiety, and irritability. Reports worry and rumination. Reports over thinking. Reports panic attacks. Mood has been lower. Recently lost brother and is grieving his loss. Taking medications as prescribed and feel they are helpful, but not working as well as the were. Willing to consider other options. Stating "I'm willing to do anything at this point". Varying interest and motivation. Taking medications as prescribed. Followed for multiple medical issues. Energy levels lower. Active, does not have a regular exercise routine with physical disabilities. Enjoys some usual interests and activities.  Divorced from husband of 40 years, but lives with ex-husband and his mother - 1 years old. Has a dog - "[name]". Has 3 grown children.  Spending time with family. Appetite adequate. Weight loss - 216 pounds - 63". Sleeping has improved. Averages 6 to 7 hours. Focus and concentration stable. Completing tasks. Managing aspects of household. 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  Denies AH or VH. Denies self harm. Denies substance abuse. Reporting some paranoia.  Planning to restart therapy.  Chronic pain - working with pain management.  Previous medication trials: Cymbalta, Prozac   Review of Systems:  Review of Systems  Musculoskeletal:  Negative for gait problem.  Neurological:  Negative for tremors.  Psychiatric/Behavioral:         Please refer to HPI    Medications: I have reviewed the patient's current medications.  Current Outpatient Medications  Medication Sig Dispense Refill   Brexpiprazole (REXULTI) 0.5 MG TABS Take 1 tablet (0.5 mg total) by mouth daily. 30 tablet 2   buPROPion (WELLBUTRIN SR) 150 MG 12 hr tablet Take 1 tablet (150 mg total) by mouth 2 (two) times daily. 60 tablet 5   busPIRone (BUSPAR) 10 MG tablet Take 1.5 tablets (15 mg total) by mouth 2 (two) times daily. 60 tablet 5   Cholecalciferol 25 MCG (1000 UT) tablet Take 1,000 Units by mouth daily.     ciprofloxacin (CIPRO) 500 MG tablet Take 1 tablet (500 mg total) by mouth 2 (two) times daily. 8 tablet 0   COLLAGEN PO Take 6,000 mg by mouth daily.     cyanocobalamin 2000 MCG  tablet Take 2,000 mcg by mouth daily.     estradiol (ESTRACE) 0.5 MG tablet Take 0.5 mg by mouth daily.     ferrous gluconate (FERGON) 324 MG tablet Take 324 mg by mouth daily with breakfast.     fluconazole (DIFLUCAN) 150 MG tablet Take 150 mg by mouth once a week.     FLUoxetine (PROZAC) 40 MG capsule Take 1 capsule (40 mg total) by mouth daily. 30 capsule 5   furosemide (LASIX) 20 MG tablet Take 20  mg by mouth every Monday, Wednesday, and Friday.     gabapentin (NEURONTIN) 600 MG tablet Take 600 mg by mouth in the morning, at noon, in the evening, and at bedtime.     hydrOXYzine (ATARAX) 25 MG tablet Take 25 mg by mouth 3 (three) times daily.     insulin aspart (NOVOLOG) 100 UNIT/ML injection Inject 5-8 Units into the skin 3 (three) times daily before meals.     Insulin Glargine-Lixisenatide (SOLIQUA) 100-33 UNT-MCG/ML SOPN Inject 32 Units into the skin at bedtime.     levocetirizine (XYZAL) 5 MG tablet Take 5 mg by mouth every evening.     levothyroxine (SYNTHROID) 175 MCG tablet Take 175 mcg by mouth daily before breakfast.     LORazepam (ATIVAN) 0.5 MG tablet Take 1 tablet (0.5 mg total) by mouth 2 (two) times daily. 60 tablet 2   lovastatin (MEVACOR) 40 MG tablet Take 40 mg by mouth at bedtime.     meclizine (ANTIVERT) 25 MG tablet Take 25 mg by mouth 3 (three) times daily as needed for dizziness.     metFORMIN (GLUCOPHAGE) 500 MG tablet Take 500 mg by mouth daily as needed (sugar).     metroNIDAZOLE (FLAGYL) 500 MG tablet Take 1 tablet (500 mg total) by mouth every 12 (twelve) hours. 8 tablet 0   montelukast (SINGULAIR) 10 MG tablet Take 10 mg by mouth daily.     ondansetron (ZOFRAN) 4 MG tablet Take 4 mg by mouth 3 (three) times daily as needed for refractory nausea / vomiting.     oxybutynin (DITROPAN) 5 MG tablet Take 5 mg by mouth daily.     oxyCODONE (OXY IR/ROXICODONE) 5 MG immediate release tablet Take 1-2 tablets (5-10 mg total) by mouth every 6 (six) hours as needed for breakthrough pain. (Patient taking differently: Take 15 mg by mouth every 6 (six) hours as needed for breakthrough pain.) 28 tablet 0   pantoprazole (PROTONIX) 40 MG tablet Take 40 mg by mouth daily.     rizatriptan (MAXALT) 10 MG tablet Take 10 mg by mouth every 6 (six) hours as needed for migraine. May repeat in 2 hours if needed     tiZANidine (ZANAFLEX) 4 MG capsule Take 4 mg by mouth in the morning and at  bedtime.     traZODone (DESYREL) 50 MG tablet Take 1 tablet (50 mg total) by mouth at bedtime. 30 tablet 5   valACYclovir (VALTREX) 500 MG tablet Take 500 mg by mouth 2 (two) times daily as needed (shingles).     zinc gluconate 50 MG tablet Take 50 mg by mouth daily.     No current facility-administered medications for this visit.    Medication Side Effects: None  Allergies:  Allergies  Allergen Reactions   Hydromorphone Shortness Of Breath    Respiratory arrest per patient Other reaction(s): coded with fast admin Note: Imported from external source.   Penicillins Anaphylaxis   Erythromycin Base Nausea And Vomiting   Ibuprofen Other (See  Comments)    Per pt, it makes her bleed   Liraglutide    Meperidine Hives   Nsaids     Other reaction(s): GI bleed Note: Imported from external source.    Past Medical History:  Diagnosis Date   Anxiety    Asthma    Chronic pain    Depression    Diabetes mellitus without complication (HCC)    GERD (gastroesophageal reflux disease)    Headache    migraines   Hyperlipidemia    Hypertension    Hypothyroidism    Osteoarthritis    PONV (postoperative nausea and vomiting)     No family history on file.  Social History   Socioeconomic History   Marital status: Married    Spouse name: Not on file   Number of children: Not on file   Years of education: Not on file   Highest education level: Not on file  Occupational History   Not on file  Tobacco Use   Smoking status: Never    Passive exposure: Never   Smokeless tobacco: Never  Vaping Use   Vaping Use: Never used  Substance and Sexual Activity   Alcohol use: Never   Drug use: Never   Sexual activity: Not Currently    Birth control/protection: None  Other Topics Concern   Not on file  Social History Narrative   Not on file   Social Determinants of Health   Financial Resource Strain: Not on file  Food Insecurity: Not on file  Transportation Needs: Not on file   Physical Activity: Not on file  Stress: Not on file  Social Connections: Not on file  Intimate Partner Violence: Not on file    Past Medical History, Surgical history, Social history, and Family history were reviewed and updated as appropriate.   Please see review of systems for further details on the patient's review from today.   Objective:   Physical Exam:  There were no vitals taken for this visit.  Physical Exam Constitutional:      General: She is not in acute distress. Musculoskeletal:        General: No deformity.  Neurological:     Mental Status: She is alert and oriented to person, place, and time.     Coordination: Coordination normal.  Psychiatric:        Attention and Perception: Attention and perception normal. She does not perceive auditory or visual hallucinations.        Mood and Affect: Mood normal. Mood is not anxious or depressed. Affect is not labile, blunt, angry or inappropriate.        Speech: Speech normal.        Behavior: Behavior normal.        Thought Content: Thought content normal. Thought content is not paranoid or delusional. Thought content does not include homicidal or suicidal ideation. Thought content does not include homicidal or suicidal plan.        Cognition and Memory: Cognition and memory normal.        Judgment: Judgment normal.     Comments: Insight intact     Lab Review:     Component Value Date/Time   NA 138 12/24/2021 0452   K 3.4 (L) 12/24/2021 0452   CL 105 12/24/2021 0452   CO2 26 12/24/2021 0452   GLUCOSE 159 (H) 12/24/2021 0452   BUN 7 (L) 12/24/2021 0452   CREATININE 0.56 12/24/2021 0452   CALCIUM 8.4 (L) 12/24/2021 0452   PROT 5.8 (  L) 12/23/2021 0527   ALBUMIN 2.8 (L) 12/23/2021 0527   AST 16 12/23/2021 0527   ALT 15 12/23/2021 0527   ALKPHOS 54 12/23/2021 0527   BILITOT 0.6 12/23/2021 0527   GFRNONAA >60 12/24/2021 0452       Component Value Date/Time   WBC 12.4 (H) 12/24/2021 0452   RBC 4.36  12/24/2021 0452   HGB 12.8 12/24/2021 0452   HCT 40.1 12/24/2021 0452   PLT 182 12/24/2021 0452   MCV 92.0 12/24/2021 0452   MCH 29.4 12/24/2021 0452   MCHC 31.9 12/24/2021 0452   RDW 12.9 12/24/2021 0452   LYMPHSABS 1.6 12/22/2021 0800   MONOABS 0.8 12/22/2021 0800   EOSABS 0.0 12/22/2021 0800   BASOSABS 0.1 12/22/2021 0800    No results found for: "POCLITH", "LITHIUM"   No results found for: "PHENYTOIN", "PHENOBARB", "VALPROATE", "CBMZ"   .res Assessment: Plan:    Plan:  PDMP reviewed  Prozac 40mg  daily  Buspar 15mg  BID Wellbutrin SR 150mg  BID Trazadone 50mg  at bedtime Ativan 0.5mg  BID prn as needed  Add Rexulti - 0.5mg  daily.   Set up with IOP - - staff to call with information  Time spent with patient was 25 minutes. Greater than 50% of face to face time with patient was spent on counseling and coordination of care.    RTC 3 weeks  Patient advised to contact office with any questions, adverse effects, or acute worsening in signs and symptoms.   Discussed potential benefits, risk, and side effects of benzodiazepines to include potential risk of tolerance and dependence, as well as possible drowsiness.  Advised patient not to drive if experiencing drowsiness and to take lowest possible effective dose to minimize risk of dependence and tolerance.   Diagnoses and all orders for this visit:  Major depressive disorder, recurrent episode, moderate (HCC) -     Brexpiprazole (REXULTI) 0.5 MG TABS; Take 1 tablet (0.5 mg total) by mouth daily.  Generalized anxiety disorder  [name] (post-traumatic stress disorder)  Panic attacks  Insomnia, unspecified type     Please see After Visit Summary for patient specific instructions.  No future appointments.  No orders of the defined types were placed in this encounter.     -------------------------------

## 2022-01-28 ENCOUNTER — Telehealth: Payer: Self-pay | Admitting: Adult Health

## 2022-01-28 NOTE — Telephone Encounter (Signed)
Please advise 

## 2022-01-28 NOTE — Telephone Encounter (Signed)
Pt informed

## 2022-01-28 NOTE — Telephone Encounter (Signed)
[name] called to day to report that she went to the pharmacy to pick up Rexulti and the cost is $379 so she didn't get it.  Do you want to start her on samples first to see if it will work?  Once we find out if it works and what dose we could try patient assistance.Let her know if she needs to come get samples or if you just want to try something else.

## 2022-01-28 NOTE — Telephone Encounter (Signed)
Ok to give samples

## 2022-02-15 ENCOUNTER — Encounter: Payer: Self-pay | Admitting: Adult Health

## 2022-02-15 ENCOUNTER — Telehealth (INDEPENDENT_AMBULATORY_CARE_PROVIDER_SITE_OTHER): Payer: Medicare HMO | Admitting: Adult Health

## 2022-02-15 DIAGNOSIS — G4700 Insomnia, unspecified: Secondary | ICD-10-CM

## 2022-02-15 DIAGNOSIS — F331 Major depressive disorder, recurrent, moderate: Secondary | ICD-10-CM

## 2022-02-15 DIAGNOSIS — F4310 Post-traumatic stress disorder, unspecified: Secondary | ICD-10-CM

## 2022-02-15 DIAGNOSIS — F411 Generalized anxiety disorder: Secondary | ICD-10-CM | POA: Diagnosis not present

## 2022-02-15 DIAGNOSIS — F410 Panic disorder [episodic paroxysmal anxiety] without agoraphobia: Secondary | ICD-10-CM

## 2022-02-15 MED ORDER — LORAZEPAM 0.5 MG PO TABS: 0.5000 mg | ORAL_TABLET | Freq: Two times a day (BID) | ORAL | 2 refills | Status: DC

## 2022-02-15 MED ORDER — TRAZODONE HCL 100 MG PO TABS: 50.0000 mg | ORAL_TABLET | Freq: Every day | ORAL | 1 refills | Status: DC

## 2022-02-15 NOTE — Progress Notes (Signed)
[name] [id number] 01-Jan-1956 66 y.o.  Virtual Visit via Video Note  I connected with pt @ on 02/15/22 at  3:40 PM EDT by a video enabled telemedicine application and verified that I am speaking with the correct person using two identifiers.   I discussed the limitations of evaluation and management by telemedicine and the availability of in person appointments. The patient expressed understanding and agreed to proceed.  I discussed the assessment and treatment plan with the patient. The patient was provided an opportunity to ask questions and all were answered. The patient agreed with the plan and demonstrated an understanding of the instructions.   The patient was advised to call back or seek an in-person evaluation if the symptoms worsen or if the condition fails to improve as anticipated.  I provided 25 minutes of non-face-to-face time during this encounter.  The patient was located at home.  The provider was located at [facility].   Dorothyann Gibbs, NP   Subjective:   Patient ID:  [name] is a 66 y.o. (DOB 19-Mar-1956) female.  Chief Complaint: No chief complaint on file.   [name], [name], [name], insomnia and panic attacks.    Describes mood today as "better". Pleasant. Tearful at times. Mood symptoms - reports decreased depression, anxiety and irritability. Reports decreased worry and rumination. Reports decreased over thinking. Reports decreased panic attacks. Mood has improved. Feels like the addition of Rexulti has been helpful - "it has made a 64% difference for me". Stating "I still have my moments". Has gone to church for the first time in a long time - getting out more - wanting to do things. Improved interest and motivation. Taking medications as prescribed. Followed for multiple medical issues. Energy levels improved. Active, does not have a regular exercise routine with physical disabilities. Enjoys some  usual interests and activities. Divorced from husband of 40 years, but lives with ex-husband and his mother - 42 years old. Has a dog - "[name]". Has 3 grown children.  Spending time with family. Appetite adequate. Weight loss - 216 pounds - 65". Sleeping has improved. Averages 6 to 7 hours of broken sleep - up and down at night. Focus and concentration stable. Completing tasks. Managing aspects of household. 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  Denies AH or VH. Denies self harm. Denies substance abuse. Reporting some paranoia.  Planning to restart therapy.  Chronic pain - working with pain management.  Previous medication trials: Cymbalta, Prozac   Review of Systems:  Review of Systems  Musculoskeletal:  Negative for gait problem.  Neurological:  Negative for tremors.  Psychiatric/Behavioral:         Please refer to HPI    Medications: I have reviewed the patient's current medications.  Current Outpatient Medications  Medication Sig Dispense Refill   Brexpiprazole (REXULTI) 0.5 MG TABS Take 1 tablet (0.5 mg total) by mouth daily. 30 tablet 2   buPROPion (WELLBUTRIN SR) 150 MG 12 hr tablet Take 1 tablet (150 mg total) by mouth 2 (two) times daily. 60 tablet 5   busPIRone (BUSPAR) 10 MG tablet Take 1.5 tablets (15 mg total) by mouth 2 (two) times daily. 60 tablet 5   Cholecalciferol 25 MCG (1000 UT) tablet Take 1,000 Units by mouth daily.     ciprofloxacin (CIPRO) 500 MG tablet Take 1 tablet (500 mg total) by mouth 2 (two) times daily. 8 tablet  0   COLLAGEN PO Take 6,000 mg by mouth daily.     cyanocobalamin 2000 MCG tablet Take 2,000 mcg by mouth daily.     estradiol (ESTRACE) 0.5 MG tablet Take 0.5 mg by mouth daily.     ferrous gluconate (FERGON) 324 MG tablet Take 324 mg by mouth daily with breakfast.     fluconazole (DIFLUCAN) 150 MG tablet Take 150 mg by mouth once a week.     FLUoxetine (PROZAC) 40 MG capsule Take 1 capsule (40 mg total)  by mouth daily. 30 capsule 5   furosemide (LASIX) 20 MG tablet Take 20 mg by mouth every Monday, Wednesday, and Friday.     gabapentin (NEURONTIN) 600 MG tablet Take 600 mg by mouth in the morning, at noon, in the evening, and at bedtime.     hydrOXYzine (ATARAX) 25 MG tablet Take 25 mg by mouth 3 (three) times daily.     insulin aspart (NOVOLOG) 100 UNIT/ML injection Inject 5-8 Units into the skin 3 (three) times daily before meals.     Insulin Glargine-Lixisenatide (SOLIQUA) 100-33 UNT-MCG/ML SOPN Inject 32 Units into the skin at bedtime.     levocetirizine (XYZAL) 5 MG tablet Take 5 mg by mouth every evening.     levothyroxine (SYNTHROID) 175 MCG tablet Take 175 mcg by mouth daily before breakfast.     LORazepam (ATIVAN) 0.5 MG tablet Take 1 tablet (0.5 mg total) by mouth 2 (two) times daily. 60 tablet 2   lovastatin (MEVACOR) 40 MG tablet Take 40 mg by mouth at bedtime.     meclizine (ANTIVERT) 25 MG tablet Take 25 mg by mouth 3 (three) times daily as needed for dizziness.     metFORMIN (GLUCOPHAGE) 500 MG tablet Take 500 mg by mouth daily as needed (sugar).     metroNIDAZOLE (FLAGYL) 500 MG tablet Take 1 tablet (500 mg total) by mouth every 12 (twelve) hours. 8 tablet 0   montelukast (SINGULAIR) 10 MG tablet Take 10 mg by mouth daily.     ondansetron (ZOFRAN) 4 MG tablet Take 4 mg by mouth 3 (three) times daily as needed for refractory nausea / vomiting.     oxybutynin (DITROPAN) 5 MG tablet Take 5 mg by mouth daily.     oxyCODONE (OXY IR/ROXICODONE) 5 MG immediate release tablet Take 1-2 tablets (5-10 mg total) by mouth every 6 (six) hours as needed for breakthrough pain. (Patient taking differently: Take 15 mg by mouth every 6 (six) hours as needed for breakthrough pain.) 28 tablet 0   pantoprazole (PROTONIX) 40 MG tablet Take 40 mg by mouth daily.     rizatriptan (MAXALT) 10 MG tablet Take 10 mg by mouth every 6 (six) hours as needed for migraine. May repeat in 2 hours if needed      tiZANidine (ZANAFLEX) 4 MG capsule Take 4 mg by mouth in the morning and at bedtime.     traZODone (DESYREL) 50 MG tablet Take 1 tablet (50 mg total) by mouth at bedtime. 30 tablet 5   valACYclovir (VALTREX) 500 MG tablet Take 500 mg by mouth 2 (two) times daily as needed (shingles).     zinc gluconate 50 MG tablet Take 50 mg by mouth daily.     No current facility-administered medications for this visit.    Medication Side Effects: None  Allergies:  Allergies  Allergen Reactions   Hydromorphone Shortness Of Breath    Respiratory arrest per patient Other reaction(s): coded with fast admin Note: Imported from  external source.   Penicillins Anaphylaxis   Erythromycin Base Nausea And Vomiting   Ibuprofen Other (See Comments)    Per pt, it makes her bleed   Liraglutide    Meperidine Hives   Nsaids     Other reaction(s): GI bleed Note: Imported from external source.    Past Medical History:  Diagnosis Date   Anxiety    Asthma    Chronic pain    Depression    Diabetes mellitus without complication (HCC)    GERD (gastroesophageal reflux disease)    Headache    migraines   Hyperlipidemia    Hypertension    Hypothyroidism    Osteoarthritis    PONV (postoperative nausea and vomiting)     No family history on file.  Social History   Socioeconomic History   Marital status: Married    Spouse name: Not on file   Number of children: Not on file   Years of education: Not on file   Highest education level: Not on file  Occupational History   Not on file  Tobacco Use   Smoking status: Never    Passive exposure: Never   Smokeless tobacco: Never  Vaping Use   Vaping Use: Never used  Substance and Sexual Activity   Alcohol use: Never   Drug use: Never   Sexual activity: Not Currently    Birth control/protection: None  Other Topics Concern   Not on file  Social History Narrative   Not on file   Social Determinants of Health   Financial Resource Strain: Not on file   Food Insecurity: Not on file  Transportation Needs: Not on file  Physical Activity: Not on file  Stress: Not on file  Social Connections: Not on file  Intimate Partner Violence: Not on file    Past Medical History, Surgical history, Social history, and Family history were reviewed and updated as appropriate.   Please see review of systems for further details on the patient's review from today.   Objective:   Physical Exam:  There were no vitals taken for this visit.  Physical Exam Constitutional:      General: She is not in acute distress. Musculoskeletal:        General: No deformity.  Neurological:     Mental Status: She is alert and oriented to person, place, and time.     Coordination: Coordination normal.  Psychiatric:        Attention and Perception: Attention and perception normal. She does not perceive auditory or visual hallucinations.        Mood and Affect: Mood normal. Mood is not anxious or depressed. Affect is not labile, blunt, angry or inappropriate.        Speech: Speech normal.        Behavior: Behavior normal.        Thought Content: Thought content normal. Thought content is not paranoid or delusional. Thought content does not include homicidal or suicidal ideation. Thought content does not include homicidal or suicidal plan.        Cognition and Memory: Cognition and memory normal.        Judgment: Judgment normal.     Comments: Insight intact     Lab Review:     Component Value Date/Time   NA 138 12/24/2021 0452   K 3.4 (L) 12/24/2021 0452   CL 105 12/24/2021 0452   CO2 26 12/24/2021 0452   GLUCOSE 159 (H) 12/24/2021 0452   BUN 7 (L) 12/24/2021  0452   CREATININE 0.56 12/24/2021 0452   CALCIUM 8.4 (L) 12/24/2021 0452   PROT 5.8 (L) 12/23/2021 0527   ALBUMIN 2.8 (L) 12/23/2021 0527   AST 16 12/23/2021 0527   ALT 15 12/23/2021 0527   ALKPHOS 54 12/23/2021 0527   BILITOT 0.6 12/23/2021 0527   GFRNONAA >60 12/24/2021 0452       Component  Value Date/Time   WBC 12.4 (H) 12/24/2021 0452   RBC 4.36 12/24/2021 0452   HGB 12.8 12/24/2021 0452   HCT 40.1 12/24/2021 0452   PLT 182 12/24/2021 0452   MCV 92.0 12/24/2021 0452   MCH 29.4 12/24/2021 0452   MCHC 31.9 12/24/2021 0452   RDW 12.9 12/24/2021 0452   LYMPHSABS 1.6 12/22/2021 0800   MONOABS 0.8 12/22/2021 0800   EOSABS 0.0 12/22/2021 0800   BASOSABS 0.1 12/22/2021 0800    No results found for: "POCLITH", "LITHIUM"   No results found for: "PHENYTOIN", "PHENOBARB", "VALPROATE", "CBMZ"   .res Assessment: Plan:     Plan:  PDMP reviewed  Prozac 40mg  daily  Buspar 15mg  BID Wellbutrin SR 150mg  BID Increase Trazadone 50mg  to 100mg  at bedtime Ativan 0.5mg  BID prn as needed  Add Rexulti - 0.5mg  daily.   Set up with IOP - - staff to call with information  Time spent with patient was 25 minutes. Greater than 50% of face to face time with patient was spent on counseling and coordination of care.    RTC 3 weeks  Patient advised to contact office with any questions, adverse effects, or acute worsening in signs and symptoms.   Discussed potential benefits, risk, and side effects of benzodiazepines to include potential risk of tolerance and dependence, as well as possible drowsiness.  Advised patient not to drive if experiencing drowsiness and to take lowest possible effective dose to minimize risk of dependence and tolerance.   There are no diagnoses linked to this encounter.   Please see After Visit Summary for patient specific instructions.  No future appointments.  No orders of the defined types were placed in this encounter.     -------------------------------

## 2022-02-16 ENCOUNTER — Telehealth: Payer: Medicare HMO | Admitting: Adult Health

## 2022-03-03 ENCOUNTER — Other Ambulatory Visit: Payer: Self-pay | Admitting: Adult Health

## 2022-03-03 DIAGNOSIS — F331 Major depressive disorder, recurrent, moderate: Secondary | ICD-10-CM

## 2022-03-03 MED ORDER — REXULTI 0.5 MG PO TABS
0.5000 mg | ORAL_TABLET | Freq: Every day | ORAL | 3 refills | Status: DC
Start: 2022-03-03 — End: 2022-04-25

## 2022-03-09 ENCOUNTER — Other Ambulatory Visit: Payer: Self-pay | Admitting: Adult Health

## 2022-03-09 DIAGNOSIS — F331 Major depressive disorder, recurrent, moderate: Secondary | ICD-10-CM

## 2022-03-09 MED ORDER — BUPROPION HCL ER (SR) 150 MG PO TB12: 150.0000 mg | ORAL_TABLET | Freq: Two times a day (BID) | ORAL | 3 refills | Status: DC

## 2022-03-18 ENCOUNTER — Encounter: Payer: Self-pay | Admitting: Adult Health

## 2022-03-18 ENCOUNTER — Telehealth (INDEPENDENT_AMBULATORY_CARE_PROVIDER_SITE_OTHER): Payer: Self-pay | Admitting: Adult Health

## 2022-03-18 ENCOUNTER — Telehealth (INDEPENDENT_AMBULATORY_CARE_PROVIDER_SITE_OTHER): Payer: Medicare HMO | Admitting: Adult Health

## 2022-03-18 DIAGNOSIS — F489 Nonpsychotic mental disorder, unspecified: Secondary | ICD-10-CM

## 2022-03-18 DIAGNOSIS — F331 Major depressive disorder, recurrent, moderate: Secondary | ICD-10-CM | POA: Diagnosis not present

## 2022-03-18 DIAGNOSIS — F410 Panic disorder [episodic paroxysmal anxiety] without agoraphobia: Secondary | ICD-10-CM | POA: Diagnosis not present

## 2022-03-18 DIAGNOSIS — F411 Generalized anxiety disorder: Secondary | ICD-10-CM

## 2022-03-18 DIAGNOSIS — F4310 Post-traumatic stress disorder, unspecified: Secondary | ICD-10-CM | POA: Diagnosis not present

## 2022-03-18 DIAGNOSIS — G4700 Insomnia, unspecified: Secondary | ICD-10-CM | POA: Diagnosis not present

## 2022-03-18 MED ORDER — TRAZODONE HCL 100 MG PO TABS: 100.0000 mg | ORAL_TABLET | Freq: Every day | ORAL | 1 refills | Status: DC

## 2022-03-18 NOTE — Progress Notes (Signed)
Patient no show appointment. ? ?

## 2022-03-18 NOTE — Progress Notes (Signed)
[name] [id number] 01-30-1956 66 y.o.  Virtual Visit via Video Note  I connected with pt @ on 03/18/22 at  2:40 PM EDT by a video enabled telemedicine application and verified that I am speaking with the correct person using two identifiers.   I discussed the limitations of evaluation and management by telemedicine and the availability of in person appointments. The patient expressed understanding and agreed to proceed.  I discussed the assessment and treatment plan with the patient. The patient was provided an opportunity to ask questions and all were answered. The patient agreed with the plan and demonstrated an understanding of the instructions.   The patient was advised to call back or seek an in-person evaluation if the symptoms worsen or if the condition fails to improve as anticipated.  I provided 25 minutes of non-face-to-face time during this encounter.  The patient was located at home.  The provider was located at [facility].   Dorothyann Gibbs, NP   Subjective:   Patient ID:  [name] is a 66 y.o. (DOB 03/07/1956) female.  Chief Complaint: No chief complaint on file.   [name], [name], [name], insomnia and panic attacks.  Describes mood today as "better". Pleasant. Tearful at times. Mood symptoms - reports decreased depression with addition of Rexulti - felt like the 0.5mg  was helpful initially, but may have plateaued. Reports increased anxiety and irritability. Reports worry and rumination. Reports over thinking. Reports one recent panic attack - grocery. Mood initially improved with addition of Rexulti - not working as well. Stating "I'm not feeling as well as I was". Still getting out some. Mother-in-law with health issues. Improved interest and motivation. Taking medications as prescribed. Followed for multiple medical issues. Energy levels improved. Active, does not have a regular exercise routine with physical  disabilities. Enjoys some usual interests and activities. Divorced from husband of 40 years, but lives with ex-husband and his mother - 93 years old. Has a dog - "[name]". Has 3 grown children.  Spending time with family. Appetite adequate. Weight loss - 216 pounds - 63". Sleeping has improved - getting to sleep. Averages 6 to 7 hours of broken sleep - up and down at night - 4 times. Focus and concentration stable. Completing tasks. Managing aspects of household. 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  Denies AH or VH. Denies self harm. Denies substance abuse. Reporting some paranoia.  Planning to restart therapy.  Chronic pain - working with pain management.  Previous medication trials: Cymbalta, Prozac   Review of Systems:  Review of Systems  Musculoskeletal:  Negative for gait problem.  Neurological:  Negative for tremors.  Psychiatric/Behavioral:         Please refer to HPI    Medications: I have reviewed the patient's current medications.  Current Outpatient Medications  Medication Sig Dispense Refill   Brexpiprazole (REXULTI) 0.5 MG TABS Take 1 tablet (0.5 mg total) by mouth daily. 90 tablet 3   buPROPion (WELLBUTRIN SR) 150 MG 12 hr tablet Take 1 tablet (150 mg total) by mouth 2 (two) times daily. 180 tablet 3   busPIRone (BUSPAR) 10 MG tablet Take 1.5 tablets (15 mg total) by mouth 2 (two) times daily. 60 tablet 5   Cholecalciferol 25 MCG (1000 UT) tablet Take 1,000 Units by mouth daily.     ciprofloxacin (CIPRO) 500 MG tablet Take 1 tablet (500 mg total) by mouth 2 (  two) times daily. 8 tablet 0   COLLAGEN PO Take 6,000 mg by mouth daily.     cyanocobalamin 2000 MCG tablet Take 2,000 mcg by mouth daily.     estradiol (ESTRACE) 0.5 MG tablet Take 0.5 mg by mouth daily.     ferrous gluconate (FERGON) 324 MG tablet Take 324 mg by mouth daily with breakfast.     fluconazole (DIFLUCAN) 150 MG tablet Take 150 mg by mouth once a week.      FLUoxetine (PROZAC) 40 MG capsule Take 1 capsule (40 mg total) by mouth daily. 30 capsule 5   furosemide (LASIX) 20 MG tablet Take 20 mg by mouth every Monday, Wednesday, and Friday.     gabapentin (NEURONTIN) 600 MG tablet Take 600 mg by mouth in the morning, at noon, in the evening, and at bedtime.     hydrOXYzine (ATARAX) 25 MG tablet Take 25 mg by mouth 3 (three) times daily.     insulin aspart (NOVOLOG) 100 UNIT/ML injection Inject 5-8 Units into the skin 3 (three) times daily before meals.     Insulin Glargine-Lixisenatide (SOLIQUA) 100-33 UNT-MCG/ML SOPN Inject 32 Units into the skin at bedtime.     levocetirizine (XYZAL) 5 MG tablet Take 5 mg by mouth every evening.     levothyroxine (SYNTHROID) 175 MCG tablet Take 175 mcg by mouth daily before breakfast.     LORazepam (ATIVAN) 0.5 MG tablet Take 1 tablet (0.5 mg total) by mouth 2 (two) times daily. 60 tablet 2   lovastatin (MEVACOR) 40 MG tablet Take 40 mg by mouth at bedtime.     meclizine (ANTIVERT) 25 MG tablet Take 25 mg by mouth 3 (three) times daily as needed for dizziness.     metFORMIN (GLUCOPHAGE) 500 MG tablet Take 500 mg by mouth daily as needed (sugar).     metroNIDAZOLE (FLAGYL) 500 MG tablet Take 1 tablet (500 mg total) by mouth every 12 (twelve) hours. 8 tablet 0   montelukast (SINGULAIR) 10 MG tablet Take 10 mg by mouth daily.     ondansetron (ZOFRAN) 4 MG tablet Take 4 mg by mouth 3 (three) times daily as needed for refractory nausea / vomiting.     oxybutynin (DITROPAN) 5 MG tablet Take 5 mg by mouth daily.     oxyCODONE (OXY IR/ROXICODONE) 5 MG immediate release tablet Take 1-2 tablets (5-10 mg total) by mouth every 6 (six) hours as needed for breakthrough pain. (Patient taking differently: Take 15 mg by mouth every 6 (six) hours as needed for breakthrough pain.) 28 tablet 0   pantoprazole (PROTONIX) 40 MG tablet Take 40 mg by mouth daily.     rizatriptan (MAXALT) 10 MG tablet Take 10 mg by mouth every 6 (six) hours as  needed for migraine. May repeat in 2 hours if needed     tiZANidine (ZANAFLEX) 4 MG capsule Take 4 mg by mouth in the morning and at bedtime.     traZODone (DESYREL) 100 MG tablet Take 1 tablet (100 mg total) by mouth at bedtime. 90 tablet 1   valACYclovir (VALTREX) 500 MG tablet Take 500 mg by mouth 2 (two) times daily as needed (shingles).     zinc gluconate 50 MG tablet Take 50 mg by mouth daily.     No current facility-administered medications for this visit.    Medication Side Effects: None  Allergies:  Allergies  Allergen Reactions   Hydromorphone Shortness Of Breath    Respiratory arrest per patient Other reaction(s): coded with  fast admin Note: Imported from external source.   Penicillins Anaphylaxis   Erythromycin Base Nausea And Vomiting   Ibuprofen Other (See Comments)    Per pt, it makes her bleed   Liraglutide    Meperidine Hives   Nsaids     Other reaction(s): GI bleed Note: Imported from external source.    Past Medical History:  Diagnosis Date   Anxiety    Asthma    Chronic pain    Depression    Diabetes mellitus without complication (HCC)    GERD (gastroesophageal reflux disease)    Headache    migraines   Hyperlipidemia    Hypertension    Hypothyroidism    Osteoarthritis    PONV (postoperative nausea and vomiting)     No family history on file.  Social History   Socioeconomic History   Marital status: Married    Spouse name: Not on file   Number of children: Not on file   Years of education: Not on file   Highest education level: Not on file  Occupational History   Not on file  Tobacco Use   Smoking status: Never    Passive exposure: Never   Smokeless tobacco: Never  Vaping Use   Vaping Use: Never used  Substance and Sexual Activity   Alcohol use: Never   Drug use: Never   Sexual activity: Not Currently    Birth control/protection: None  Other Topics Concern   Not on file  Social History Narrative   Not on file   Social  Determinants of Health   Financial Resource Strain: Not on file  Food Insecurity: Not on file  Transportation Needs: Not on file  Physical Activity: Not on file  Stress: Not on file  Social Connections: Not on file  Intimate Partner Violence: Not on file    Past Medical History, Surgical history, Social history, and Family history were reviewed and updated as appropriate.   Please see review of systems for further details on the patient's review from today.   Objective:   Physical Exam:  There were no vitals taken for this visit.  Physical Exam Constitutional:      General: She is not in acute distress. Musculoskeletal:        General: No deformity.  Neurological:     Mental Status: She is alert and oriented to person, place, and time.     Coordination: Coordination normal.  Psychiatric:        Attention and Perception: Attention and perception normal. She does not perceive auditory or visual hallucinations.        Mood and Affect: Mood normal. Mood is not anxious or depressed. Affect is not labile, blunt, angry or inappropriate.        Speech: Speech normal.        Behavior: Behavior normal.        Thought Content: Thought content normal. Thought content is not paranoid or delusional. Thought content does not include homicidal or suicidal ideation. Thought content does not include homicidal or suicidal plan.        Cognition and Memory: Cognition and memory normal.        Judgment: Judgment normal.     Comments: Insight intact     Lab Review:     Component Value Date/Time   NA 138 12/24/2021 0452   K 3.4 (L) 12/24/2021 0452   CL 105 12/24/2021 0452   CO2 26 12/24/2021 0452   GLUCOSE 159 (H) 12/24/2021 8546  BUN 7 (L) 12/24/2021 0452   CREATININE 0.56 12/24/2021 0452   CALCIUM 8.4 (L) 12/24/2021 0452   PROT 5.8 (L) 12/23/2021 0527   ALBUMIN 2.8 (L) 12/23/2021 0527   AST 16 12/23/2021 0527   ALT 15 12/23/2021 0527   ALKPHOS 54 12/23/2021 0527   BILITOT 0.6  12/23/2021 0527   GFRNONAA >60 12/24/2021 0452       Component Value Date/Time   WBC 12.4 (H) 12/24/2021 0452   RBC 4.36 12/24/2021 0452   HGB 12.8 12/24/2021 0452   HCT 40.1 12/24/2021 0452   PLT 182 12/24/2021 0452   MCV 92.0 12/24/2021 0452   MCH 29.4 12/24/2021 0452   MCHC 31.9 12/24/2021 0452   RDW 12.9 12/24/2021 0452   LYMPHSABS 1.6 12/22/2021 0800   MONOABS 0.8 12/22/2021 0800   EOSABS 0.0 12/22/2021 0800   BASOSABS 0.1 12/22/2021 0800    No results found for: "POCLITH", "LITHIUM"   No results found for: "PHENYTOIN", "PHENOBARB", "VALPROATE", "CBMZ"   .res Assessment: Plan:    Plan:  PDMP reviewed  Prozac 40mg  daily  Buspar 15mg  BID Wellbutrin SR 150mg  BID Trazadone 100mg  at bedtime Ativan 0.5mg  BID prn as needed  Increase Rexulti - 0.5mg  to 1 mg daily.   Set up with IOP - - staff to call with information   Time spent with patient was 25 minutes. Greater than 50% of face to face time with patient was spent on counseling and coordination of care.    RTC 4 weeks  Patient advised to contact office with any questions, adverse effects, or acute worsening in signs and symptoms.   Discussed potential benefits, risk, and side effects of benzodiazepines to include potential risk of tolerance and dependence, as well as possible drowsiness.  Advised patient not to drive if experiencing drowsiness and to take lowest possible effective dose to minimize risk of dependence and tolerance.   Diagnoses and all orders for this visit:  Major depressive disorder, recurrent episode, moderate (HCC)  [name] (post-traumatic stress disorder)  Insomnia, unspecified type -     traZODone (DESYREL) 100 MG tablet; Take 1 tablet (100 mg total) by mouth at bedtime.  Panic attacks  Generalized anxiety disorder     Please see After Visit Summary for patient specific instructions.  No future appointments.   No orders of the defined types were placed in this  encounter.     -------------------------------

## 2022-04-19 ENCOUNTER — Encounter: Payer: Self-pay | Admitting: Adult Health

## 2022-04-19 ENCOUNTER — Telehealth (INDEPENDENT_AMBULATORY_CARE_PROVIDER_SITE_OTHER): Payer: Medicare HMO | Admitting: Adult Health

## 2022-04-19 DIAGNOSIS — F4310 Post-traumatic stress disorder, unspecified: Secondary | ICD-10-CM | POA: Diagnosis not present

## 2022-04-19 DIAGNOSIS — F411 Generalized anxiety disorder: Secondary | ICD-10-CM

## 2022-04-19 DIAGNOSIS — F410 Panic disorder [episodic paroxysmal anxiety] without agoraphobia: Secondary | ICD-10-CM

## 2022-04-19 DIAGNOSIS — F331 Major depressive disorder, recurrent, moderate: Secondary | ICD-10-CM | POA: Diagnosis not present

## 2022-04-19 DIAGNOSIS — G4700 Insomnia, unspecified: Secondary | ICD-10-CM | POA: Diagnosis not present

## 2022-04-19 NOTE — Progress Notes (Signed)
[name] [id number] June 28, 1955 66 y.o.  Virtual Visit via Video Note  I connected with pt @ on 04/19/22 at  1:20 PM EST by a video enabled telemedicine application and verified that I am speaking with the correct person using two identifiers.   I discussed the limitations of evaluation and management by telemedicine and the availability of in person appointments. The patient expressed understanding and agreed to proceed.  I discussed the assessment and treatment plan with the patient. The patient was provided an opportunity to ask questions and all were answered. The patient agreed with the plan and demonstrated an understanding of the instructions.   The patient was advised to call back or seek an in-person evaluation if the symptoms worsen or if the condition fails to improve as anticipated.  I provided 25 minutes of non-face-to-face time during this encounter.  The patient was located at home.  The provider was located at [facility].   Dorothyann Gibbs, NP   Subjective:   Patient ID:  [name] is a 66 y.o. (DOB Sep 14, 1955) female.  Chief Complaint: No chief complaint on file.   HPI [name] presents for follow-up of  MDD, [name], [name], insomnia and panic attacks.  Describes mood today as "better". Pleasant. Tearful at times. Mood symptoms - reports decreased depression with Rexulti - now taking 1mg  daily. Reports  anxiety and irritability - "I get easily irritated". Reports worry and rumination - not quite as bad. Reports decreased over thinking. Reports one recent panic attack - grocery store - someone acting out. Mood has improved with the increase in the Rexulti from 0.5mg  to 1mg . Stating "I'm doing a little better". Improved interest and motivation. Taking medications as prescribed. Followed for multiple medical issues. Energy levels varies. Active, does not have a regular exercise routine with physical disabilities. Enjoys some usual interests and  activities. Divorced from husband of 40 years, but lives with ex-husband and his mother - 46 years old. Has a dog - "[name]". Has 3 grown children.  Spending time with family. Appetite adequate. Weight gain - 216 to 224 pounds - 65". Sleep has improved with the addition of Trazadone. Averages 6 to 7 hours of broken sleep - up and down at night. Focus and concentration "not as good as it was in the past". Completing tasks. Managing aspects of household. 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  Denies AH or VH. Denies self harm. Denies substance abuse. Reporting some paranoia - "a little when around others - fearful"  A1C - 7.8  Planning to restart therapy.  Chronic pain - working with pain management.  Previous medication trials: Cymbalta, Prozac    Review of Systems:  Review of Systems  Musculoskeletal:  Negative for gait problem.  Neurological:  Negative for tremors.  Psychiatric/Behavioral:         Please refer to HPI    Medications: I have reviewed the patient's current medications.  Current Outpatient Medications  Medication Sig Dispense Refill   Brexpiprazole (REXULTI) 0.5 MG TABS Take 1 tablet (0.5 mg total) by mouth daily. 90 tablet 3   buPROPion (WELLBUTRIN SR) 150 MG 12 hr tablet Take 1 tablet (150 mg total) by mouth 2 (two) times daily. 180 tablet 3   busPIRone (BUSPAR) 10 MG tablet Take 1.5 tablets (15 mg total) by mouth 2 (two) times daily. 60 tablet 5   Cholecalciferol 25 MCG (1000 UT) tablet Take 1,000 Units by mouth  daily.     ciprofloxacin (CIPRO) 500 MG tablet Take 1 tablet (500 mg total) by mouth 2 (two) times daily. 8 tablet 0   COLLAGEN PO Take 6,000 mg by mouth daily.     cyanocobalamin 2000 MCG tablet Take 2,000 mcg by mouth daily.     estradiol (ESTRACE) 0.5 MG tablet Take 0.5 mg by mouth daily.     ferrous gluconate (FERGON) 324 MG tablet Take 324 mg by mouth daily with breakfast.     fluconazole (DIFLUCAN) 150 MG tablet  Take 150 mg by mouth once a week.     FLUoxetine (PROZAC) 40 MG capsule Take 1 capsule (40 mg total) by mouth daily. 30 capsule 5   furosemide (LASIX) 20 MG tablet Take 20 mg by mouth every Monday, Wednesday, and Friday.     gabapentin (NEURONTIN) 600 MG tablet Take 600 mg by mouth in the morning, at noon, in the evening, and at bedtime.     hydrOXYzine (ATARAX) 25 MG tablet Take 25 mg by mouth 3 (three) times daily.     insulin aspart (NOVOLOG) 100 UNIT/ML injection Inject 5-8 Units into the skin 3 (three) times daily before meals.     Insulin Glargine-Lixisenatide (SOLIQUA) 100-33 UNT-MCG/ML SOPN Inject 32 Units into the skin at bedtime.     levocetirizine (XYZAL) 5 MG tablet Take 5 mg by mouth every evening.     levothyroxine (SYNTHROID) 175 MCG tablet Take 175 mcg by mouth daily before breakfast.     LORazepam (ATIVAN) 0.5 MG tablet Take 1 tablet (0.5 mg total) by mouth 2 (two) times daily. 60 tablet 2   lovastatin (MEVACOR) 40 MG tablet Take 40 mg by mouth at bedtime.     meclizine (ANTIVERT) 25 MG tablet Take 25 mg by mouth 3 (three) times daily as needed for dizziness.     metFORMIN (GLUCOPHAGE) 500 MG tablet Take 500 mg by mouth daily as needed (sugar).     metroNIDAZOLE (FLAGYL) 500 MG tablet Take 1 tablet (500 mg total) by mouth every 12 (twelve) hours. 8 tablet 0   montelukast (SINGULAIR) 10 MG tablet Take 10 mg by mouth daily.     ondansetron (ZOFRAN) 4 MG tablet Take 4 mg by mouth 3 (three) times daily as needed for refractory nausea / vomiting.     oxybutynin (DITROPAN) 5 MG tablet Take 5 mg by mouth daily.     oxyCODONE (OXY IR/ROXICODONE) 5 MG immediate release tablet Take 1-2 tablets (5-10 mg total) by mouth every 6 (six) hours as needed for breakthrough pain. (Patient taking differently: Take 15 mg by mouth every 6 (six) hours as needed for breakthrough pain.) 28 tablet 0   pantoprazole (PROTONIX) 40 MG tablet Take 40 mg by mouth daily.     rizatriptan (MAXALT) 10 MG tablet Take  10 mg by mouth every 6 (six) hours as needed for migraine. May repeat in 2 hours if needed     tiZANidine (ZANAFLEX) 4 MG capsule Take 4 mg by mouth in the morning and at bedtime.     traZODone (DESYREL) 100 MG tablet Take 1 tablet (100 mg total) by mouth at bedtime. 90 tablet 1   valACYclovir (VALTREX) 500 MG tablet Take 500 mg by mouth 2 (two) times daily as needed (shingles).     zinc gluconate 50 MG tablet Take 50 mg by mouth daily.     No current facility-administered medications for this visit.    Medication Side Effects: None  Allergies:  Allergies  Allergen Reactions   Hydromorphone Shortness Of Breath    Respiratory arrest per patient Other reaction(s): coded with fast admin Note: Imported from external source.   Penicillins Anaphylaxis   Erythromycin Base Nausea And Vomiting   Ibuprofen Other (See Comments)    Per pt, it makes her bleed   Liraglutide    Meperidine Hives   Nsaids     Other reaction(s): GI bleed Note: Imported from external source.    Past Medical History:  Diagnosis Date   Anxiety    Asthma    Chronic pain    Depression    Diabetes mellitus without complication (HCC)    GERD (gastroesophageal reflux disease)    Headache    migraines   Hyperlipidemia    Hypertension    Hypothyroidism    Osteoarthritis    PONV (postoperative nausea and vomiting)     No family history on file.  Social History   Socioeconomic History   Marital status: Married    Spouse name: Not on file   Number of children: Not on file   Years of education: Not on file   Highest education level: Not on file  Occupational History   Not on file  Tobacco Use   Smoking status: Never    Passive exposure: Never   Smokeless tobacco: Never  Vaping Use   Vaping Use: Never used  Substance and Sexual Activity   Alcohol use: Never   Drug use: Never   Sexual activity: Not Currently    Birth control/protection: None  Other Topics Concern   Not on file  Social History  Narrative   Not on file   Social Determinants of Health   Financial Resource Strain: Not on file  Food Insecurity: Not on file  Transportation Needs: Not on file  Physical Activity: Not on file  Stress: Not on file  Social Connections: Not on file  Intimate Partner Violence: Not on file    Past Medical History, Surgical history, Social history, and Family history were reviewed and updated as appropriate.   Please see review of systems for further details on the patient's review from today.   Objective:   Physical Exam:  There were no vitals taken for this visit.  Physical Exam Constitutional:      General: She is not in acute distress. Musculoskeletal:        General: No deformity.  Neurological:     Mental Status: She is alert and oriented to person, place, and time.     Coordination: Coordination normal.  Psychiatric:        Attention and Perception: Attention and perception normal. She does not perceive auditory or visual hallucinations.        Mood and Affect: Mood normal. Mood is not anxious or depressed. Affect is not labile, blunt, angry or inappropriate.        Speech: Speech normal.        Behavior: Behavior normal.        Thought Content: Thought content normal. Thought content is not paranoid or delusional. Thought content does not include homicidal or suicidal ideation. Thought content does not include homicidal or suicidal plan.        Cognition and Memory: Cognition and memory normal.        Judgment: Judgment normal.     Comments: Insight intact     Lab Review:     Component Value Date/Time   NA 138 12/24/2021 0452   K 3.4 (L) 12/24/2021 9518  CL 105 12/24/2021 0452   CO2 26 12/24/2021 0452   GLUCOSE 159 (H) 12/24/2021 0452   BUN 7 (L) 12/24/2021 0452   CREATININE 0.56 12/24/2021 0452   CALCIUM 8.4 (L) 12/24/2021 0452   PROT 5.8 (L) 12/23/2021 0527   ALBUMIN 2.8 (L) 12/23/2021 0527   AST 16 12/23/2021 0527   ALT 15 12/23/2021 0527   ALKPHOS 54  12/23/2021 0527   BILITOT 0.6 12/23/2021 0527   GFRNONAA >60 12/24/2021 0452       Component Value Date/Time   WBC 12.4 (H) 12/24/2021 0452   RBC 4.36 12/24/2021 0452   HGB 12.8 12/24/2021 0452   HCT 40.1 12/24/2021 0452   PLT 182 12/24/2021 0452   MCV 92.0 12/24/2021 0452   MCH 29.4 12/24/2021 0452   MCHC 31.9 12/24/2021 0452   RDW 12.9 12/24/2021 0452   LYMPHSABS 1.6 12/22/2021 0800   MONOABS 0.8 12/22/2021 0800   EOSABS 0.0 12/22/2021 0800   BASOSABS 0.1 12/22/2021 0800    No results found for: "POCLITH", "LITHIUM"   No results found for: "PHENYTOIN", "PHENOBARB", "VALPROATE", "CBMZ"   .res Assessment: Plan:    Plan:  PDMP reviewed  Prozac 40mg  daily  Buspar 15mg  BID Wellbutrin SR 150mg  BID Trazadone 100mg  at bedtime Ativan 0.5mg  BID prn as needed  Rexulti - 1 mg daily - samples given - has applied for PA.   Set up with IOP - - staff to call with information   Time spent with patient was 25 minutes. Greater than 50% of face to face time with patient was spent on counseling and coordination of care.    RTC 4 weeks  Patient advised to contact office with any questions, adverse effects, or acute worsening in signs and symptoms.   Discussed potential benefits, risk, and side effects of benzodiazepines to include potential risk of tolerance and dependence, as well as possible drowsiness.  Advised patient not to drive if experiencing drowsiness and to take lowest possible effective dose to minimize risk of dependence and tolerance.   Diagnoses and all orders for this visit:  Major depressive disorder, recurrent episode, moderate (HCC)  [name] (post-traumatic stress disorder)  Insomnia, unspecified type  Generalized anxiety disorder  Panic attacks     Please see After Visit Summary for patient specific instructions.  Future Appointments  Date Time Provider Department Center  05/12/2022  1:30 PM , MD [facility] None    No  orders of the defined types were placed in this encounter.     -------------------------------

## 2022-04-22 ENCOUNTER — Telehealth: Payer: Self-pay | Admitting: Adult Health

## 2022-04-22 NOTE — Telephone Encounter (Signed)
A Rx wasn't sent so it did not initiate a PA. For 1 mg. Not sure what happen but I can send the Rx.

## 2022-04-22 NOTE — Telephone Encounter (Signed)
Pt already has a PA on file with an approval. For Rexulti 0.5 mg. Has insurance or dosage changed since August 2023?

## 2022-04-22 NOTE — Telephone Encounter (Signed)
She said she was only given samples

## 2022-04-22 NOTE — Telephone Encounter (Signed)
Pt LVM @ 2:40p on 11/28.  She said she needs Rexaulti samples.  Next appt 12/8

## 2022-04-22 NOTE — Telephone Encounter (Signed)
She is on 1 mg now

## 2022-04-25 ENCOUNTER — Other Ambulatory Visit: Payer: Self-pay

## 2022-04-25 DIAGNOSIS — F331 Major depressive disorder, recurrent, moderate: Secondary | ICD-10-CM

## 2022-04-25 MED ORDER — BREXPIPRAZOLE 1 MG PO TABS: 1.0000 mg | ORAL_TABLET | Freq: Every day | ORAL | 2 refills | Status: DC

## 2022-04-28 NOTE — Telephone Encounter (Signed)
Pt has access to medication no new PA needed

## 2022-05-12 ENCOUNTER — Encounter: Payer: Self-pay | Admitting: Neurology

## 2022-05-12 ENCOUNTER — Ambulatory Visit: Payer: Medicare HMO | Admitting: Neurology

## 2022-05-12 VITALS — BP 130/80 | HR 63 | Ht 63.0 in | Wt 228.6 lb

## 2022-05-12 DIAGNOSIS — R531 Weakness: Secondary | ICD-10-CM

## 2022-05-12 DIAGNOSIS — R270 Ataxia, unspecified: Secondary | ICD-10-CM

## 2022-05-12 DIAGNOSIS — R400 Somnolence: Secondary | ICD-10-CM

## 2022-05-12 DIAGNOSIS — G4730 Sleep apnea, unspecified: Secondary | ICD-10-CM

## 2022-05-12 DIAGNOSIS — G43711 Chronic migraine without aura, intractable, with status migrainosus: Secondary | ICD-10-CM | POA: Diagnosis not present

## 2022-05-12 DIAGNOSIS — R269 Unspecified abnormalities of gait and mobility: Secondary | ICD-10-CM

## 2022-05-12 DIAGNOSIS — R296 Repeated falls: Secondary | ICD-10-CM

## 2022-05-12 DIAGNOSIS — R519 Headache, unspecified: Secondary | ICD-10-CM

## 2022-05-12 DIAGNOSIS — R292 Abnormal reflex: Secondary | ICD-10-CM

## 2022-05-12 MED ORDER — FREMANEZUMAB-VFRM 225 MG/1.5ML ~~LOC~~ SOSY: 225.0000 mg | PREFILLED_SYRINGE | Freq: Once | SUBCUTANEOUS | Status: DC

## 2022-05-12 MED ORDER — AJOVY 225 MG/1.5ML ~~LOC~~ SOAJ: 225.0000 mg | SUBCUTANEOUS | 11 refills | Status: DC

## 2022-05-12 NOTE — Patient Instructions (Addendum)
Migraines: start Ajovy and try and get it approved, samples, and in the meantime try to get patient assistance approval Sleep study for sleep apnea - Dr. Valetta Mole MRI cervical spine for ataxia If no findings continue to MRI thoracic and lumbar spine.    Fremanezumab Injection What is this medication? FREMANEZUMAB (fre ma NEZ ue mab) prevents migraines. It works by blocking a substance in the body that causes migraines. It is a monoclonal antibody. This medicine may be used for other purposes; ask your health care provider or pharmacist if you have questions. COMMON BRAND NAME(S): AJOVY What should I tell my care team before I take this medication? They need to know if you have any of these conditions: An unusual or allergic reaction to fremanezumab, other medications, foods, dyes, or preservatives Pregnant or trying to get pregnant Breast-feeding How should I use this medication? This medication is injected under the skin. You will be taught how to prepare and give it. Take it as directed on the prescription label. Keep taking it unless your care team tells you to stop. It is important that you put your used needles and syringes in a special sharps container. Do not put them in a trash can. If you do not have a sharps container, call your pharmacist or care team to get one. Talk to your care team about the use of this medication in children. Special care may be needed. Overdosage: If you think you have taken too much of this medicine contact a poison control center or emergency room at once. NOTE: This medicine is only for you. Do not share this medicine with others. What if I miss a dose? If you miss a dose, take it as soon as you can. If it is almost time for your next dose, take only that dose. Do not take double or extra doses. What may interact with this medication? Interactions are not expected. This list may not describe all possible interactions. Give your health care provider a  list of all the medicines, herbs, non-prescription drugs, or dietary supplements you use. Also tell them if you smoke, drink alcohol, or use illegal drugs. Some items may interact with your medicine. What should I watch for while using this medication? Tell your care team if your symptoms do not start to get better or if they get worse. What side effects may I notice from receiving this medication? Side effects that you should report to your care team as soon as possible: Allergic reactions or angioedema--skin rash, itching or hives, swelling of the face, eyes, lips, tongue, arms, or legs, trouble swallowing or breathing Side effects that usually do not require medical attention (report to your care team if they continue or are bothersome): Pain, redness, or irritation at injection site This list may not describe all possible side effects. Call your doctor for medical advice about side effects. You may report side effects to FDA at 1-800-FDA-1088. Where should I keep my medication? Keep out of the reach of children and pets. Store in a refrigerator or at room temperature between 20 and 25 degrees C (68 and 77 degrees F). Refrigeration (preferred): Store in the refrigerator. Do not freeze. Keep in the original container until you are ready to take it. Remove the dose from the carton about 30 minutes before it is time for you to use it. If the dose is not used, it may be stored in the original container at room temperature for 7 days. Get rid of any unused  medication after the expiration date. Room Temperature: This medication may be stored at room temperature for up to 7 days. Keep it in the original container. Protect from light until time of use. If it is stored at room temperature, get rid of any unused medication after 7 days or after it expires, whichever is first. To get rid of medications that are no longer needed or have expired: Take the medication to a medication take-back program. Check with  your pharmacy or law enforcement to find a location. If you cannot return the medication, ask your pharmacist or care team how to get rid of this medication safely. NOTE: This sheet is a summary. It may not cover all possible information. If you have questions about this medicine, talk to your doctor, pharmacist, or health care provider.  2023 Elsevier/Gold Standard (2017-02-28 00:00:00)

## 2022-05-12 NOTE — Progress Notes (Signed)
[facility]    Provider:  Dr Lucia Gaskins Requesting Provider: Waldron Session, FNP Primary Care Provider:  Waldron Session, FNP  CC:  migraines and falls/imbalance  HPI:  [name] is a 66 y.o. female here as requested by Waldron Session, FNP for headaches,imbalance. PMHx GI bleed/rectal bleed, constipation on chronic opiods, hypokalemia and hypocalcemia, colitis, hypertension, hyperlipidemia, diabetes type 2, iron deficiency anemia, depression, osteoarthritis of hips.  I reviewed Dr. Bernette Redbird notes, she presented to discuss possible referral to neurology for impaired balance and migraines/headaches, she reported her ability to ambulate is impaired because of her imbalance and her pain, she reports when she bends over and stands upright she is unable to take any steps due to feeling off balance, she reports that her pain is center provider recommended that she see neurology again since her initial fall the neck and back pain is continued to worsen as well, she is followed by Ortho for continued treatment of neck and back pain and followed by psych.  She fell backwards out of a chair in 03/2018 and since having chronic pain, headaches, worsening migraines. She has chronic neck pain, srays completely tense, ongoing for years, failed conservative treatment and has been under the care of pcp and ortho. Her migraines are on the right side, can spread across the forehead, pulsating/pounding/throbbing, photophobia, nausea, no vomiting. She has daily headaches, she "used" to have sleep apnea and lost 100 pounds and stopped using the cpap, she does have some morning headaches, and nocturnal headaches, she wake up tired, takes her to the afternoon to feel better, Jer imbalance is when she stands she feels like she has to stabilize and if she walks she gets dizzy, imbalance, falls at least once a month. She went ti PT for 3 months in 2019.   Reviewed notes, labs and imaging from outside  physicians, which showed:  From a thorough review of records, medications tried that can be used in migraine/headache management include: Tylenol, aspirin, BuSpar, Decadron, Prozac, gabapentin, meclizine, Robaxin, Reglan, Zofran, oxycodone, prednisone, Compazine, Phenergan, Maxalt, Imitrex, tizanidine, trazodone, amitriptyline and nortriptyline and other tricyclic antidepressants contraindicated due to patient being on multiple serotonin drugs and risk of serotonin syndrome, trazodone, topiramate(did not help), aimovig contraindicated due to constipation, she has also tried amitriptyline, propranolol, topamax.   10/12/2020: MRI brain: CLINICAL DATA:  Provided history: Unspecified fall, initial encounter. Additional history provided by scanning technologist: Patient reports frequent falls, dizziness, balance issues, severe headache, difficulty walking, symptoms for 6 months.   EXAM: MRI HEAD WITHOUT AND WITH CONTRAST   TECHNIQUE: Multiplanar, multiecho pulse sequences of the brain and surrounding structures were obtained without and with intravenous contrast.   CONTRAST:  20mL MULTIHANCE GADOBENATE DIMEGLUMINE 529 MG/ML IV SOLN   COMPARISON:  Report from brain MRI 03/26/2012 (images unavailable).   FINDINGS: Brain:   Cerebral volume is normal for age.   No cortical encephalomalacia is identified. No significant white matter disease.   Prominent perivascular spaces within the basal ganglia bilateral.   There is no acute infarct.   No evidence of intracranial mass.   No chronic intracranial blood products.   No extra-axial fluid collection.   No midline shift.   No abnormal intracranial enhancement.   Vascular: Expected proximal arterial flow voids.   Skull and upper cervical spine: No focal marrow lesion.   Sinuses/Orbits: Visualized orbits show no acute finding. Trace bilateral ethmoid sinus mucosal thickening. Small right maxillary sinus mucous retention cyst.    IMPRESSION:  Unremarkable MRI appearance of the brain for age. No evidence of acute intracranial abnormality.   Mild paranasal sinus disease, as described.      Latest Ref Rng & Units 12/24/2021    4:52 AM 12/23/2021    5:27 AM 12/22/2021    8:05 AM  CBC  WBC 4.0 - 10.5 K/uL 12.4  13.9    Hemoglobin 12.0 - 15.0 g/dL 40.912.8  81.113.7  91.415.6   Hematocrit 36.0 - 46.0 % 40.1  43.5  46.0   Platelets 150 - 400 K/uL 182  179        Latest Ref Rng & Units 12/24/2021    4:52 AM 12/23/2021    5:27 AM 12/22/2021    8:05 AM  CMP  Glucose 70 - 99 mg/dL 782159  [id number]  213244   BUN 8 - 23 mg/dL 7  9  17    Creatinine 0.44 - 1.00 mg/dL 0.860.56  5.780.53  4.690.60   Sodium 135 - 145 mmol/L 138  137  139   Potassium 3.5 - 5.1 mmol/L 3.4  3.3  3.3   Chloride 98 - 111 mmol/L 105  104  99   CO2 22 - 32 mmol/L 26  26    Calcium 8.9 - 10.3 mg/dL 8.4  8.4    Total Protein 6.5 - 8.1 g/dL  5.8    Total Bilirubin 0.3 - 1.2 mg/dL  0.6    Alkaline Phos 38 - 126 U/L  54    AST 15 - 41 U/L  16    ALT 0 - 44 U/L  15      Review of Systems: Patient complains of symptoms per HPI as well as the following symptoms chronic pain. Pertinent negatives and positives per HPI. All others negative.   Social History   Socioeconomic History   Marital status: Married    Spouse name: Not on file   Number of children: Not on file   Years of education: Not on file   Highest education level: Not on file  Occupational History   Not on file  Tobacco Use   Smoking status: Never    Passive exposure: Never   Smokeless tobacco: Never  Vaping Use   Vaping Use: Never used  Substance and Sexual Activity   Alcohol use: Never   Drug use: Never   Sexual activity: Not Currently    Birth control/protection: None  Other Topics Concern   Not on file  Social History Narrative   Caffiene 1-2 cups daily.   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    Social Determinants of Health   Financial Resource Strain: Not on  file  Food Insecurity: Not on file  Transportation Needs: Not on file  Physical Activity: Not on file  Stress: Not on file  Social Connections: Not on file  Intimate Partner Violence: Not on file    History reviewed. No pertinent family history.  Past Medical History:  Diagnosis Date   Anxiety    Asthma    Chronic pain    Depression    Diabetes mellitus without complication (HCC)    GERD (gastroesophageal reflux disease)    Headache    migraines   Hyperlipidemia    Hypertension    Hypothyroidism    Osteoarthritis    PONV (postoperative nausea and vomiting)     Patient Active Problem List   Diagnosis Date Noted   GI bleed 12/23/2021   Rectal  bleed 12/22/2021   Hypokalemia 12/22/2021   Hypocalcemia 12/22/2021   Colitis 12/22/2021   HTN (hypertension) 12/22/2021   HLD (hyperlipidemia) 12/22/2021   DM2 (diabetes mellitus, type 2) (HCC) 12/22/2021   Iron deficiency anemia 12/22/2021   Depression 12/22/2021   OA (osteoarthritis) of hip 04/12/2021   Primary osteoarthritis of right hip 04/12/2021    Past Surgical History:  Procedure Laterality Date   ABDOMINAL HYSTERECTOMY     ANKLE RECONSTRUCTION Left    1977   APPENDECTOMY     CARPAL TUNNEL RELEASE Right    2010   CATARACT EXTRACTION Left    02/2021   catheter ablation     1993   CESAREAN SECTION     x2   CHOLECYSTECTOMY     HERNIA REPAIR     KNEE ARTHROSCOPY W/ MENISCECTOMY Right    2008   KNEE DISLOCATION SURGERY Right    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     2019   TOTAL HIP ARTHROPLASTY Right 04/12/2021   Procedure: TOTAL HIP ARTHROPLASTY ANTERIOR APPROACH;  Surgeon: Ollen Gross, MD;  Location: [facility];  Service: Orthopedics;  Laterality: Right;   vaginal fistula of sigmoid colon      Current Outpatient Medications  Medication Sig Dispense Refill   Ascorbic Acid (VITAMIN C) 1000 MG tablet Take 1,000 mg by mouth daily.     brexpiprazole (REXULTI) 1 MG TABS tablet Take 1 tablet (1 mg total) by mouth daily. 30  tablet 2   buPROPion (WELLBUTRIN SR) 150 MG 12 hr tablet Take 1 tablet (150 mg total) by mouth 2 (two) times daily. 180 tablet 3   busPIRone (BUSPAR) 10 MG tablet Take 1.5 tablets (15 mg total) by mouth 2 (two) times daily. 60 tablet 5   Cholecalciferol (VITAMIN D) 125 MCG (5000 UT) CAPS Take 2 capsules by mouth daily.     COLLAGEN PO Take 6,000 mg by mouth daily.     cyanocobalamin 2000 MCG tablet Take 2,000 mcg by mouth daily.     estradiol (ESTRACE) 0.5 MG tablet Take 0.5 mg by mouth daily.     ferrous gluconate (FERGON) 324 MG tablet Take 324 mg by mouth daily with breakfast.     fluconazole (DIFLUCAN) 150 MG tablet Take 150 mg by mouth once a week.     FLUoxetine (PROZAC) 40 MG capsule Take 1 capsule (40 mg total) by mouth daily. 30 capsule 5   Fremanezumab-vfrm (AJOVY) 225 MG/1.5ML SOAJ Inject 225 mg into the skin every 30 (thirty) days. 1.5 mL 11   Fremanezumab-vfrm (AJOVY) 225 MG/1.5ML SOAJ Inject 225 mg into the skin every 30 (thirty) days. 3 mL 11   furosemide (LASIX) 20 MG tablet Take 20 mg by mouth every Monday, Wednesday, and Friday.     gabapentin (NEURONTIN) 600 MG tablet Take 600 mg by mouth in the morning, at noon, in the evening, and at bedtime.     insulin aspart (NOVOLOG) 100 UNIT/ML injection Inject 5-8 Units into the skin 3 (three) times daily before meals.     Insulin Glargine-Lixisenatide (SOLIQUA) 100-33 UNT-MCG/ML SOPN Inject 32 Units into the skin at bedtime.     levocetirizine (XYZAL) 5 MG tablet Take 5 mg by mouth every evening.     levothyroxine (SYNTHROID) 175 MCG tablet Take 175 mcg by mouth daily before breakfast.     LORazepam (ATIVAN) 0.5 MG tablet Take 1 tablet (0.5 mg total) by mouth 2 (two) times daily. 60 tablet 2   lovastatin (MEVACOR) 40 MG tablet Take  40 mg by mouth at bedtime.     meclizine (ANTIVERT) 25 MG tablet Take 25 mg by mouth 3 (three) times daily as needed for dizziness.     metroNIDAZOLE (FLAGYL) 500 MG tablet Take 1 tablet (500 mg total) by  mouth every 12 (twelve) hours. 8 tablet 0   montelukast (SINGULAIR) 10 MG tablet Take 10 mg by mouth daily.     ondansetron (ZOFRAN) 4 MG tablet Take 4 mg by mouth 3 (three) times daily as needed for refractory nausea / vomiting.     oxybutynin (DITROPAN) 5 MG tablet Take 5 mg by mouth daily.     oxyCODONE (OXY IR/ROXICODONE) 5 MG immediate release tablet Take 1-2 tablets (5-10 mg total) by mouth every 6 (six) hours as needed for breakthrough pain. (Patient taking differently: Take 15 mg by mouth every 6 (six) hours as needed for breakthrough pain.) 28 tablet 0   oxyCODONE (ROXICODONE) 15 MG immediate release tablet Take 15 mg by mouth.     pantoprazole (PROTONIX) 40 MG tablet Take 40 mg by mouth daily.     rizatriptan (MAXALT) 10 MG tablet Take 10 mg by mouth every 6 (six) hours as needed for migraine. May repeat in 2 hours if needed     tiZANidine (ZANAFLEX) 4 MG capsule Take 4 mg by mouth in the morning and at bedtime.     traZODone (DESYREL) 100 MG tablet Take 1 tablet (100 mg total) by mouth at bedtime. 90 tablet 1   valACYclovir (VALTREX) 500 MG tablet Take 500 mg by mouth 2 (two) times daily as needed (shingles).     zinc gluconate 50 MG tablet Take 50 mg by mouth daily.     Current Facility-Administered Medications  Medication Dose Route Frequency Provider Last Rate Last Admin   Fremanezumab-vfrm SOSY 225 mg  225 mg Subcutaneous Once Anson Fret, MD        Allergies as of 05/12/2022 - Review Complete 05/12/2022  Allergen Reaction Noted   Hydromorphone Shortness Of Breath 04/07/2012   Penicillins Anaphylaxis 04/07/2012   Erythromycin base Nausea And Vomiting 04/07/2012   Ibuprofen Other (See Comments) 12/21/2021   Liraglutide  10/28/2021   Meperidine Hives 04/07/2012   Nsaids  10/28/2021    Vitals: BP 130/80   Pulse 63   Ht 5\' 3"  (1.6 m)   Wt 228 lb 9.6 oz (103.7 kg)   BMI 40.49 kg/m  Last Weight:  Wt Readings from Last 1 Encounters:  05/12/22 228 lb 9.6 oz (103.7  kg)   Last Height:   Ht Readings from Last 1 Encounters:  05/12/22 5\' 3"  (1.6 m)     Physical exam: Exam: Gen: NAD, conversant, well nourised, obese, well groomed                     CV: RRR, no MRG. No Carotid Bruits. No peripheral edema, warm, nontender Eyes: Conjunctivae clear without exudates or hemorrhage  Neuro: Detailed Neurologic Exam  Speech:    Speech is normal; fluent and spontaneous with normal comprehension.  Cognition:    The patient is oriented to person, place, and time;     recent and remote memory intact;     language fluent;     normal attention, concentration,     fund of knowledge Cranial Nerves:    The pupils are equal, round, and reactive to light. The fundi are normal and spontaneous venous pulsations are present. Visual fields are full to finger confrontation. Extraocular movements are  intact. Trigeminal sensation is intact and the muscles of mastication are normal. The face is symmetric. The palate elevates in the midline. Hearing intact. Voice is normal. Shoulder shrug is normal. The tongue has normal motion without fasciculations.   Coordination:    Normal finger to nose and heel to shin. Normal rapid alternating movements.   Gait:    Heel-toe and tandem gait are abnormal, imbalance, bradykinesia due to fear of falling  Motor Observation:    No asymmetry, no atrophy, and no involuntary movements noted. Tone:    Normal muscle tone.    Posture:    Posture is normal. normal erect    Strength: right-sided generalize prox > distal weakness otherwise strength is V/V in the upper and lower limbs.      Sensation: intact to LT     Reflex Exam:  DTR's:    Deep tendon reflexes in the upper and lower extremities are normal bilaterally.  But slightly more brisk right biceps Toes:    The toes are downgoing bilaterally.   Clonus:    Clonus is absent.    Assessment/Plan:  Patient with chronic migraines, likely sleep apnea (ESS 11, BMI > 40, large  neck, had sleep apnea in the past), ataxia, chronic pain.   OSA: She has daily headaches, she "used" to have sleep apnea and lost 100 pounds and stopped using the cpap, she does have some morning headaches, and nocturnal headaches, she wake up tired, takes her to the afternoon to feel better, ESS11 and FSS 36  MRI cervical spine: chronic neck pain has been under the care of physicians years, failed conservative measures and PT for 3 months. Looking for cervical myelopathy for ataxia. Her right biceps is brisker, she is ataxic, multiple falls. If neg MRi thoracic and lumbar spine.  Start Ajovy, gave 3 months sample for migraines, chronic, gave her the patient assistance paperwork to fill out. Could also try emgality or botox. If she can;t afford the new meds could try Candesaartan next.   Orders Placed This Encounter  Procedures   MR CERVICAL SPINE WO CONTRAST   Ambulatory referral to Sleep Studies   Meds ordered this encounter  Medications   Fremanezumab-vfrm SOSY 225 mg    09/2024 [id number]   Fremanezumab-vfrm (AJOVY) 225 MG/1.5ML SOAJ    Sig: Inject 225 mg into the skin every 30 (thirty) days.    Dispense:  1.5 mL    Refill:  11   Fremanezumab-vfrm (AJOVY) 225 MG/1.5ML SOAJ    Sig: Inject 225 mg into the skin every 30 (thirty) days.    Dispense:  3 mL    Refill:  11    09/2024 [id number]  \  Discussed risks of untreated sleep apnea including stroke, fatigue, weight gain and others  Cc: Waldron Session, FNP,  Waldron Session, FNP  Naomie Dean, MD  [facility] [address] [place], [place] [address]  Phone [phone] Fax [phone]  I spent over 60 minutes of face-to-face and non-face-to-face time with patient on the  1. Chronic migraine without aura, with intractable migraine, so stated, with status migrainosus   2. Sleep apnea, unspecified type   3. Morning headache   4. Daytime somnolence   5. Ataxia   6. Falls frequently   7. Gait  abnormality   8. Abnormal deep tendon reflex   9. Right sided weakness   10. Asymmetric biceps reflexes    diagnosis.  This included previsit chart review, lab review, study review,  order entry, electronic health record documentation, patient education on the different diagnostic and therapeutic options, counseling and coordination of care, risks and benefits of management, compliance, or risk factor reduction

## 2022-05-12 NOTE — Telephone Encounter (Signed)
Pt LVM @ 9:53a.  She is asking for samples of [place]i.  Next appt 12/8

## 2022-05-12 NOTE — Progress Notes (Signed)
Epworth Sleepiness Scale 0= would never doze 1= slight chance of dozing 2= moderate chance of dozing 3= high chance of dozing  Sitting and reading: 1 Watching TV: 1 Sitting inactive in a public place (ex. Theater or meeting): 1 As a passenger in a car for an hour without a break: 2 Lying down to rest in the afternoon: 2 Sitting and talking to someone: 1 Sitting quietly after lunch (no alcohol): 2 In a car, while stopped in traffic: 1 Total: 11  FSS 36

## 2022-05-12 NOTE — Telephone Encounter (Signed)
I lvm that she has a script at the pharmacy and to call them and to call us back if she's still having a problem.

## 2022-05-16 ENCOUNTER — Telehealth: Payer: Self-pay

## 2022-05-16 ENCOUNTER — Telehealth: Payer: Self-pay | Admitting: Neurology

## 2022-05-16 NOTE — Telephone Encounter (Signed)
MR cervical spine wo contrast [employer] auth: [id number] (05/16/22-06/15/22) sent to [facility] for scheduling.

## 2022-05-16 NOTE — Telephone Encounter (Signed)
A PA for Ajovy 225 MG/ 1.5 ML Auto-injectors has been started on [facility]. Key #: [id number]. Your information has been submitted to [facility]. [employer] will review the request and will issue a decision, typically within 3-7 days from your submission.

## 2022-05-20 ENCOUNTER — Telehealth (INDEPENDENT_AMBULATORY_CARE_PROVIDER_SITE_OTHER): Payer: Medicare HMO | Admitting: Adult Health

## 2022-05-20 ENCOUNTER — Encounter: Payer: Self-pay | Admitting: Adult Health

## 2022-05-20 DIAGNOSIS — F4310 Post-traumatic stress disorder, unspecified: Secondary | ICD-10-CM

## 2022-05-20 DIAGNOSIS — F411 Generalized anxiety disorder: Secondary | ICD-10-CM

## 2022-05-20 DIAGNOSIS — F331 Major depressive disorder, recurrent, moderate: Secondary | ICD-10-CM

## 2022-05-20 DIAGNOSIS — F410 Panic disorder [episodic paroxysmal anxiety] without agoraphobia: Secondary | ICD-10-CM

## 2022-05-20 MED ORDER — FLUOXETINE HCL 40 MG PO CAPS: 40.0000 mg | ORAL_CAPSULE | Freq: Every day | ORAL | 5 refills | Status: DC

## 2022-05-20 MED ORDER — LORAZEPAM 0.5 MG PO TABS: ORAL_TABLET | ORAL | 2 refills | Status: DC

## 2022-05-20 MED ORDER — BUSPIRONE HCL 10 MG PO TABS: 15.0000 mg | ORAL_TABLET | Freq: Two times a day (BID) | ORAL | 5 refills | Status: DC

## 2022-05-20 NOTE — Progress Notes (Signed)
[name] [id number] 12-Nov-1955 66 y.o.  Virtual Visit via Telephone Note  I connected with pt on 05/20/22 at  1:40 PM EST by telephone and verified that I am speaking with the correct person using two identifiers.   I discussed the limitations, risks, security and privacy concerns of performing an evaluation and management service by telephone and the availability of in person appointments. I also discussed with the patient that there may be a patient responsible charge related to this service. The patient expressed understanding and agreed to proceed.   I discussed the assessment and treatment plan with the patient. The patient was provided an opportunity to ask questions and all were answered. The patient agreed with the plan and demonstrated an understanding of the instructions.   The patient was advised to call back or seek an in-person evaluation if the symptoms worsen or if the condition fails to improve as anticipated.  I provided 25 minutes of non-face-to-face time during this encounter.  The patient was located at home.  The provider was located at [facility].   Dorothyann Gibbs, NP   Subjective:   Patient ID:  [name] is a 66 y.o. (DOB [birth year]) female.  Chief Complaint: No chief complaint on file.   HPI [name] presents for follow-up of  MDD, [name], [name], insomnia and panic attacks.  Describes mood today as "better". Pleasant. Tearful at times. Mood symptoms - reports depression, anxiety, and irritability - "this is a tough time of the year for me". Reports some worry and rumination - "I'm not sure it is going to get better". Reports over thinking. Reports 2 recent  panic attacks. Mood is "better" with the addition of Rexulti - taking 1mg  daily. Stating "I'm having good and bad days - some days I don't want to get out of the bed". Varying interest and motivation. Taking medications as prescribed. Followed for multiple medical issues. Energy  levels varies. Active, does not have a regular exercise routine with physical disabilities. Enjoys some usual interests and activities. Divorced from husband of 40 years, but lives with ex-husband and his mother - 63 years old. Has a dog - "[name]". Has 3 grown children.  Spending time with family. Appetite adequate. Weight gain - 216 to 224 pounds - 63". Sleep has improved with the addition of Trazadone. Averages 6 to 7 hours of broken sleep - up and down at night. Focus and concentration variable. Completing tasks. Managing aspects of household. 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  Denies AH or VH. Denies self harm. Denies substance abuse. Reporting some paranoia  Planning to restart therapy.  Chronic pain - working with pain management.  Seeing Dr. 82 - managing migraines  Previous medication trials: Cymbalta, Prozac   Review of Systems:  Review of Systems  Musculoskeletal:  Negative for gait problem.  Neurological:  Negative for tremors.  Psychiatric/Behavioral:         Please refer to HPI    Medications: I have reviewed the patient's current medications.  Current Outpatient Medications  Medication Sig Dispense Refill   Ascorbic Acid (VITAMIN C) 1000 MG tablet Take 1,000 mg by mouth daily.     brexpiprazole (REXULTI) 1 MG TABS tablet Take 1 tablet (1 mg total) by mouth daily. 30 tablet 2   buPROPion (WELLBUTRIN SR) 150 MG 12 hr tablet Take 1 tablet (150 mg total) by mouth 2 (two) times daily. 180 tablet 3  busPIRone (BUSPAR) 10 MG tablet Take 1.5 tablets (15 mg total) by mouth 2 (two) times daily. 60 tablet 5   Cholecalciferol (VITAMIN D) 125 MCG (5000 UT) CAPS Take 2 capsules by mouth daily.     COLLAGEN PO Take 6,000 mg by mouth daily.     cyanocobalamin 2000 MCG tablet Take 2,000 mcg by mouth daily.     estradiol (ESTRACE) 0.5 MG tablet Take 0.5 mg by mouth daily.     ferrous gluconate (FERGON) 324 MG tablet Take 324 mg by mouth  daily with breakfast.     fluconazole (DIFLUCAN) 150 MG tablet Take 150 mg by mouth once a week.     FLUoxetine (PROZAC) 40 MG capsule Take 1 capsule (40 mg total) by mouth daily. 30 capsule 5   Fremanezumab-vfrm (AJOVY) 225 MG/1.5ML SOAJ Inject 225 mg into the skin every 30 (thirty) days. 1.5 mL 11   Fremanezumab-vfrm (AJOVY) 225 MG/1.5ML SOAJ Inject 225 mg into the skin every 30 (thirty) days. 3 mL 11   furosemide (LASIX) 20 MG tablet Take 20 mg by mouth every Monday, Wednesday, and Friday.     gabapentin (NEURONTIN) 600 MG tablet Take 600 mg by mouth in the morning, at noon, in the evening, and at bedtime.     insulin aspart (NOVOLOG) 100 UNIT/ML injection Inject 5-8 Units into the skin 3 (three) times daily before meals.     Insulin Glargine-Lixisenatide (SOLIQUA) 100-33 UNT-MCG/ML SOPN Inject 32 Units into the skin at bedtime.     levocetirizine (XYZAL) 5 MG tablet Take 5 mg by mouth every evening.     levothyroxine (SYNTHROID) 175 MCG tablet Take 175 mcg by mouth daily before breakfast.     LORazepam (ATIVAN) 0.5 MG tablet Take one to 2 tablets daily as needed for panic attacks. 60 tablet 2   lovastatin (MEVACOR) 40 MG tablet Take 40 mg by mouth at bedtime.     meclizine (ANTIVERT) 25 MG tablet Take 25 mg by mouth 3 (three) times daily as needed for dizziness.     metroNIDAZOLE (FLAGYL) 500 MG tablet Take 1 tablet (500 mg total) by mouth every 12 (twelve) hours. 8 tablet 0   montelukast (SINGULAIR) 10 MG tablet Take 10 mg by mouth daily.     ondansetron (ZOFRAN) 4 MG tablet Take 4 mg by mouth 3 (three) times daily as needed for refractory nausea / vomiting.     oxybutynin (DITROPAN) 5 MG tablet Take 5 mg by mouth daily.     oxyCODONE (OXY IR/ROXICODONE) 5 MG immediate release tablet Take 1-2 tablets (5-10 mg total) by mouth every 6 (six) hours as needed for breakthrough pain. (Patient taking differently: Take 15 mg by mouth every 6 (six) hours as needed for breakthrough pain.) 28 tablet 0    oxyCODONE (ROXICODONE) 15 MG immediate release tablet Take 15 mg by mouth.     pantoprazole (PROTONIX) 40 MG tablet Take 40 mg by mouth daily.     rizatriptan (MAXALT) 10 MG tablet Take 10 mg by mouth every 6 (six) hours as needed for migraine. May repeat in 2 hours if needed     tiZANidine (ZANAFLEX) 4 MG capsule Take 4 mg by mouth in the morning and at bedtime.     traZODone (DESYREL) 100 MG tablet Take 1 tablet (100 mg total) by mouth at bedtime. 90 tablet 1   valACYclovir (VALTREX) 500 MG tablet Take 500 mg by mouth 2 (two) times daily as needed (shingles).     zinc gluconate  50 MG tablet Take 50 mg by mouth daily.     Current Facility-Administered Medications  Medication Dose Route Frequency Provider Last Rate Last Admin   Fremanezumab-vfrm SOSY 225 mg  225 mg Subcutaneous Once Anson Fret, MD        Medication Side Effects: None  Allergies:  Allergies  Allergen Reactions   Hydromorphone Shortness Of Breath    Respiratory arrest per patient Other reaction(s): coded with fast admin Note: Imported from external source.   Penicillins Anaphylaxis   Erythromycin Base Nausea And Vomiting   Ibuprofen Other (See Comments)    Per pt, it makes her bleed   Liraglutide    Meperidine Hives   Nsaids     Other reaction(s): GI bleed Note: Imported from external source.    Past Medical History:  Diagnosis Date   Anxiety    Asthma    Chronic pain    Depression    Diabetes mellitus without complication (HCC)    GERD (gastroesophageal reflux disease)    Headache    migraines   Hyperlipidemia    Hypertension    Hypothyroidism    Osteoarthritis    PONV (postoperative nausea and vomiting)     No family history on file.  Social History   Socioeconomic History   Marital status: Married    Spouse name: Not on file   Number of children: Not on file   Years of education: Not on file   Highest education level: Not on file  Occupational History   Not on file  Tobacco Use    Smoking status: Never    Passive exposure: Never   Smokeless tobacco: Never  Vaping Use   Vaping Use: Never used  Substance and Sexual Activity   Alcohol use: Never   Drug use: Never   Sexual activity: Not Currently    Birth control/protection: None  Other Topics Concern   Not on file  Social History Narrative   Caffiene 1-2 cups daily.   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    Social Determinants of Health   Financial Resource Strain: Not on file  Food Insecurity: Not on file  Transportation Needs: Not on file  Physical Activity: Not on file  Stress: Not on file  Social Connections: Not on file  Intimate Partner Violence: Not on file    Past Medical History, Surgical history, Social history, and Family history were reviewed and updated as appropriate.   Please see review of systems for further details on the patient's review from today.   Objective:   Physical Exam:  There were no vitals taken for this visit.  Physical Exam Constitutional:      General: She is not in acute distress. Musculoskeletal:        General: No deformity.  Neurological:     Mental Status: She is alert and oriented to person, place, and time.     Coordination: Coordination normal.  Psychiatric:        Attention and Perception: Attention and perception normal. She does not perceive auditory or visual hallucinations.        Mood and Affect: Mood normal. Mood is not anxious or depressed. Affect is not labile, blunt, angry or inappropriate.        Speech: Speech normal.        Behavior: Behavior normal.        Thought Content: Thought content normal. Thought content is not paranoid or  delusional. Thought content does not include homicidal or suicidal ideation. Thought content does not include homicidal or suicidal plan.        Cognition and Memory: Cognition and memory normal.        Judgment: Judgment normal.     Comments: Insight intact     Lab Review:      Component Value Date/Time   NA 138 12/24/2021 0452   K 3.4 (L) 12/24/2021 0452   CL 105 12/24/2021 0452   CO2 26 12/24/2021 0452   GLUCOSE 159 (H) 12/24/2021 0452   BUN 7 (L) 12/24/2021 0452   CREATININE 0.56 12/24/2021 0452   CALCIUM 8.4 (L) 12/24/2021 0452   PROT 5.8 (L) 12/23/2021 0527   ALBUMIN 2.8 (L) 12/23/2021 0527   AST 16 12/23/2021 0527   ALT 15 12/23/2021 0527   ALKPHOS 54 12/23/2021 0527   BILITOT 0.6 12/23/2021 0527   GFRNONAA >60 12/24/2021 0452       Component Value Date/Time   WBC 12.4 (H) 12/24/2021 0452   RBC 4.36 12/24/2021 0452   HGB 12.8 12/24/2021 0452   HCT 40.1 12/24/2021 0452   PLT 182 12/24/2021 0452   MCV 92.0 12/24/2021 0452   MCH 29.4 12/24/2021 0452   MCHC 31.9 12/24/2021 0452   RDW 12.9 12/24/2021 0452   LYMPHSABS 1.6 12/22/2021 0800   MONOABS 0.8 12/22/2021 0800   EOSABS 0.0 12/22/2021 0800   BASOSABS 0.1 12/22/2021 0800    No results found for: "POCLITH", "LITHIUM"   No results found for: "PHENYTOIN", "PHENOBARB", "VALPROATE", "CBMZ"   .res Assessment: Plan:    Plan:  PDMP reviewed  Prozac 40mg  daily  Buspar 15mg  BID Wellbutrin SR 150mg  BID Trazadone 100mg  at bedtime Ativan 0.5mg  BID daily prn as needed Rexulti - 1 mg daily - samples given - has applied for PA.   Set up with IOP - - staff to call with information   Time spent with patient was 25 minutes. Greater than 50% of face to face time with patient was spent on counseling and coordination of care.    RTC 6 weeks  Patient advised to contact office with any questions, adverse effects, or acute worsening in signs and symptoms.   Discussed potential benefits, risk, and side effects of benzodiazepines to include potential risk of tolerance and dependence, as well as possible drowsiness.  Advised patient not to drive if experiencing drowsiness and to take lowest possible effective dose to minimize risk of dependence and tolerance.   Diagnoses and all orders  for this visit:  Major depressive disorder, recurrent episode, moderate (HCC) -     FLUoxetine (PROZAC) 40 MG capsule; Take 1 capsule (40 mg total) by mouth daily.  Generalized anxiety disorder -     FLUoxetine (PROZAC) 40 MG capsule; Take 1 capsule (40 mg total) by mouth daily. -     busPIRone (BUSPAR) 10 MG tablet; Take 1.5 tablets (15 mg total) by mouth 2 (two) times daily. -     LORazepam (ATIVAN) 0.5 MG tablet; Take one to 2 tablets daily as needed for panic attacks.  [name] (post-traumatic stress disorder) -     FLUoxetine (PROZAC) 40 MG capsule; Take 1 capsule (40 mg total) by mouth daily.  Panic attacks -     LORazepam (ATIVAN) 0.5 MG tablet; Take one to 2 tablets daily as needed for panic attacks.    Please see After Visit Summary for patient specific instructions.  Future Appointments  Date Time Provider Department Center  05/29/2022  4:00 PM [facility] MR 3 [facility]  09/15/2022  3:45 PM Glean Salvo, NP [facility] None    No orders of the defined types were placed in this encounter.     -------------------------------

## 2022-05-29 ENCOUNTER — Ambulatory Visit
Admission: RE | Admit: 2022-05-29 | Discharge: 2022-05-29 | Disposition: A | Discharge status: Home / Self Care | Payer: Medicare HMO | Source: Ambulatory Visit | Attending: Neurology | Admitting: Neurology

## 2022-05-29 DIAGNOSIS — R270 Ataxia, unspecified: Secondary | ICD-10-CM

## 2022-05-29 DIAGNOSIS — R269 Unspecified abnormalities of gait and mobility: Secondary | ICD-10-CM

## 2022-05-29 DIAGNOSIS — R531 Weakness: Secondary | ICD-10-CM

## 2022-05-29 DIAGNOSIS — R296 Repeated falls: Secondary | ICD-10-CM

## 2022-05-29 DIAGNOSIS — R292 Abnormal reflex: Secondary | ICD-10-CM

## 2022-06-20 ENCOUNTER — Telehealth: Payer: Self-pay | Admitting: Adult Health

## 2022-06-20 NOTE — Telephone Encounter (Signed)
[name] has finally been approved for Pt. Assistant for her [place].  She will also get her Wellbutrin through this program.  She is approved through 06/13/23.

## 2022-06-23 ENCOUNTER — Telehealth (INDEPENDENT_AMBULATORY_CARE_PROVIDER_SITE_OTHER): Payer: Medicare HMO | Admitting: Adult Health

## 2022-06-23 ENCOUNTER — Encounter: Payer: Self-pay | Admitting: Adult Health

## 2022-06-23 DIAGNOSIS — G4700 Insomnia, unspecified: Secondary | ICD-10-CM

## 2022-06-23 DIAGNOSIS — F331 Major depressive disorder, recurrent, moderate: Secondary | ICD-10-CM | POA: Diagnosis not present

## 2022-06-23 DIAGNOSIS — F4310 Post-traumatic stress disorder, unspecified: Secondary | ICD-10-CM

## 2022-06-23 DIAGNOSIS — F411 Generalized anxiety disorder: Secondary | ICD-10-CM

## 2022-06-23 MED ORDER — FLUOXETINE HCL 40 MG PO CAPS: 40.0000 mg | ORAL_CAPSULE | Freq: Every day | ORAL | 3 refills | Status: DC

## 2022-06-23 MED ORDER — TRAZODONE HCL 100 MG PO TABS: 100.0000 mg | ORAL_TABLET | Freq: Every day | ORAL | 3 refills | Status: DC

## 2022-06-23 NOTE — Progress Notes (Signed)
[name] [id number] 24-Feb-1956 67 y.o.  Virtual Visit via Video Note  I connected with pt @ on 06/23/22 at  3:00 PM EST by a video enabled telemedicine application and verified that I am speaking with the correct person using two identifiers.   I discussed the limitations of evaluation and management by telemedicine and the availability of in person appointments. The patient expressed understanding and agreed to proceed.  I discussed the assessment and treatment plan with the patient. The patient was provided an opportunity to ask questions and all were answered. The patient agreed with the plan and demonstrated an understanding of the instructions.   The patient was advised to call back or seek an in-person evaluation if the symptoms worsen or if the condition fails to improve as anticipated.  I provided 25 minutes of non-face-to-face time during this encounter.  The patient was located at home.  The provider was located at [facility].   Dorothyann Gibbs, NP   Subjective:   Patient ID:  [name] is a 67 y.o. (DOB 11/23/1955) female.  Chief Complaint: No chief complaint on file.   HPI [name] presents for follow-up of MDD, [name], [name], insomnia and panic attacks.  Describes mood today as "not the best". Pleasant. Tearful at times. Mood symptoms - reports depression, anxiety, and irritability. Reports 1 recent  panic attacks. Reports worry, rumination, and over thinking. Mood is lower. Feels like the addition of Rexulti has been helpful - but not as much as it was initially - willing to increase dose. Varying interest and motivation. Taking medications as prescribed. Followed for multiple medical issues. Energy evels lower. Active, does not have a regular exercise routine with physical disabilities. Enjoys some usual interests and activities. Divorced from husband of 40 years, but lives with ex-husband and his mother - 5 years old. Has a dog - "[name]". Has 3  grown children.  Spending time with family. Appetite adequate. Weight gain - 224 to 226 pounds - 63". Sleep has improved with Trazadone. Averages 6 to 7 hours - broken sleep. Focus and concentration variable. Completing tasks. Managing aspects of household. 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  Denies AH or VH. Denies self harm. Denies substance abuse. Reporting some paranoia.  Chronic pain - working with pain management.  Seeing Dr. Lucia Gaskins - managing migraines  Previous medication trials: Cymbalta, Prozac   Review of Systems:  Review of Systems  Musculoskeletal:  Negative for gait problem.  Neurological:  Negative for tremors.  Psychiatric/Behavioral:         Please refer to HPI    Medications: I have reviewed the patient's current medications.  Current Outpatient Medications  Medication Sig Dispense Refill   Ascorbic Acid (VITAMIN C) 1000 MG tablet Take 1,000 mg by mouth daily.     brexpiprazole (REXULTI) 1 MG TABS tablet Take 1 tablet (1 mg total) by mouth daily. 30 tablet 2   buPROPion (WELLBUTRIN SR) 150 MG 12 hr tablet Take 1 tablet (150 mg total) by mouth 2 (two) times daily. 180 tablet 3   busPIRone (BUSPAR) 10 MG tablet Take 1.5 tablets (15 mg total) by mouth 2 (two) times daily. 60 tablet 5   Cholecalciferol (VITAMIN D) 125 MCG (5000 UT) CAPS Take 2 capsules by mouth daily.     COLLAGEN PO Take 6,000 mg by mouth daily.     cyanocobalamin 2000 MCG tablet Take 2,000 mcg by mouth daily.  estradiol (ESTRACE) 0.5 MG tablet Take 0.5 mg by mouth daily.     ferrous gluconate (FERGON) 324 MG tablet Take 324 mg by mouth daily with breakfast.     fluconazole (DIFLUCAN) 150 MG tablet Take 150 mg by mouth once a week.     FLUoxetine (PROZAC) 40 MG capsule Take 1 capsule (40 mg total) by mouth daily. 30 capsule 5   Fremanezumab-vfrm (AJOVY) 225 MG/1.5ML SOAJ Inject 225 mg into the skin every 30 (thirty) days. 1.5 mL 11   Fremanezumab-vfrm  (AJOVY) 225 MG/1.5ML SOAJ Inject 225 mg into the skin every 30 (thirty) days. 3 mL 11   furosemide (LASIX) 20 MG tablet Take 20 mg by mouth every Monday, Wednesday, and Friday.     gabapentin (NEURONTIN) 600 MG tablet Take 600 mg by mouth in the morning, at noon, in the evening, and at bedtime.     insulin aspart (NOVOLOG) 100 UNIT/ML injection Inject 5-8 Units into the skin 3 (three) times daily before meals.     Insulin Glargine-Lixisenatide (SOLIQUA) 100-33 UNT-MCG/ML SOPN Inject 32 Units into the skin at bedtime.     levocetirizine (XYZAL) 5 MG tablet Take 5 mg by mouth every evening.     levothyroxine (SYNTHROID) 175 MCG tablet Take 175 mcg by mouth daily before breakfast.     LORazepam (ATIVAN) 0.5 MG tablet Take one to 2 tablets daily as needed for panic attacks. 60 tablet 2   lovastatin (MEVACOR) 40 MG tablet Take 40 mg by mouth at bedtime.     meclizine (ANTIVERT) 25 MG tablet Take 25 mg by mouth 3 (three) times daily as needed for dizziness.     metroNIDAZOLE (FLAGYL) 500 MG tablet Take 1 tablet (500 mg total) by mouth every 12 (twelve) hours. 8 tablet 0   montelukast (SINGULAIR) 10 MG tablet Take 10 mg by mouth daily.     ondansetron (ZOFRAN) 4 MG tablet Take 4 mg by mouth 3 (three) times daily as needed for refractory nausea / vomiting.     oxybutynin (DITROPAN) 5 MG tablet Take 5 mg by mouth daily.     oxyCODONE (OXY IR/ROXICODONE) 5 MG immediate release tablet Take 1-2 tablets (5-10 mg total) by mouth every 6 (six) hours as needed for breakthrough pain. (Patient taking differently: Take 15 mg by mouth every 6 (six) hours as needed for breakthrough pain.) 28 tablet 0   oxyCODONE (ROXICODONE) 15 MG immediate release tablet Take 15 mg by mouth.     pantoprazole (PROTONIX) 40 MG tablet Take 40 mg by mouth daily.     rizatriptan (MAXALT) 10 MG tablet Take 10 mg by mouth every 6 (six) hours as needed for migraine. May repeat in 2 hours if needed     tiZANidine (ZANAFLEX) 4 MG capsule Take 4  mg by mouth in the morning and at bedtime.     traZODone (DESYREL) 100 MG tablet Take 1 tablet (100 mg total) by mouth at bedtime. 90 tablet 1   valACYclovir (VALTREX) 500 MG tablet Take 500 mg by mouth 2 (two) times daily as needed (shingles).     zinc gluconate 50 MG tablet Take 50 mg by mouth daily.     Current Facility-Administered Medications  Medication Dose Route Frequency Provider Last Rate Last Admin   Fremanezumab-vfrm SOSY 225 mg  225 mg Subcutaneous Once Melvenia Beam, MD        Medication Side Effects: None  Allergies:  Allergies  Allergen Reactions   Hydromorphone Shortness Of Breath  Respiratory arrest per patient Other reaction(s): coded with fast admin Note: Imported from external source.   Penicillins Anaphylaxis   Erythromycin Base Nausea And Vomiting   Ibuprofen Other (See Comments)    Per pt, it makes her bleed   Liraglutide    Meperidine Hives   Nsaids     Other reaction(s): GI bleed Note: Imported from external source.    Past Medical History:  Diagnosis Date   Anxiety    Asthma    Chronic pain    Depression    Diabetes mellitus without complication (HCC)    GERD (gastroesophageal reflux disease)    Headache    migraines   Hyperlipidemia    Hypertension    Hypothyroidism    Osteoarthritis    PONV (postoperative nausea and vomiting)     No family history on file.  Social History   Socioeconomic History   Marital status: Married    Spouse name: Not on file   Number of children: Not on file   Years of education: Not on file   Highest education level: Not on file  Occupational History   Not on file  Tobacco Use   Smoking status: Never    Passive exposure: Never   Smokeless tobacco: Never  Vaping Use   Vaping Use: Never used  Substance and Sexual Activity   Alcohol use: Never   Drug use: Never   Sexual activity: Not Currently    Birth control/protection: None  Other Topics Concern   Not on file  Social History Narrative    Caffiene 1-2 cups daily.   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    Social Determinants of Health   Financial Resource Strain: Not on file  Food Insecurity: Not on file  Transportation Needs: Not on file  Physical Activity: Not on file  Stress: Not on file  Social Connections: Not on file  Intimate Partner Violence: Not on file    Past Medical History, Surgical history, Social history, and Family history were reviewed and updated as appropriate.   Please see review of systems for further details on the patient's review from today.   Objective:   Physical Exam:  There were no vitals taken for this visit.  Physical Exam Constitutional:      General: She is not in acute distress. Musculoskeletal:        General: No deformity.  Neurological:     Mental Status: She is alert and oriented to person, place, and time.     Coordination: Coordination normal.  Psychiatric:        Attention and Perception: Attention and perception normal. She does not perceive auditory or visual hallucinations.        Mood and Affect: Mood normal. Mood is not anxious or depressed. Affect is not labile, blunt, angry or inappropriate.        Speech: Speech normal.        Behavior: Behavior normal.        Thought Content: Thought content normal. Thought content is not paranoid or delusional. Thought content does not include homicidal or suicidal ideation. Thought content does not include homicidal or suicidal plan.        Cognition and Memory: Cognition and memory normal.        Judgment: Judgment normal.     Comments: Insight intact     Lab Review:     Component Value Date/Time   NA 138 12/24/2021  0452   K 3.4 (L) 12/24/2021 0452   CL 105 12/24/2021 0452   CO2 26 12/24/2021 0452   GLUCOSE 159 (H) 12/24/2021 0452   BUN 7 (L) 12/24/2021 0452   CREATININE 0.56 12/24/2021 0452   CALCIUM 8.4 (L) 12/24/2021 0452   PROT 5.8 (L) 12/23/2021 0527   ALBUMIN 2.8 (L)  12/23/2021 0527   AST 16 12/23/2021 0527   ALT 15 12/23/2021 0527   ALKPHOS 54 12/23/2021 0527   BILITOT 0.6 12/23/2021 0527   GFRNONAA >60 12/24/2021 0452       Component Value Date/Time   WBC 12.4 (H) 12/24/2021 0452   RBC 4.36 12/24/2021 0452   HGB 12.8 12/24/2021 0452   HCT 40.1 12/24/2021 0452   PLT 182 12/24/2021 0452   MCV 92.0 12/24/2021 0452   MCH 29.4 12/24/2021 0452   MCHC 31.9 12/24/2021 0452   RDW 12.9 12/24/2021 0452   LYMPHSABS 1.6 12/22/2021 0800   MONOABS 0.8 12/22/2021 0800   EOSABS 0.0 12/22/2021 0800   BASOSABS 0.1 12/22/2021 0800    No results found for: "POCLITH", "LITHIUM"   No results found for: "PHENYTOIN", "PHENOBARB", "VALPROATE", "CBMZ"   .res Assessment: Plan:    Plan:  PDMP reviewed  Buspar 15mg  BID - PCP Wellbutrin SR 150mg  BID - PCP   Prozac 40mg  daily  Trazadone 100mg  at bedtime Ativan 0.5mg  BID daily prn as needed  Increase Rexulti - 1/2mg  to 1mg  daily - receives patient assistance.  Set up with IOP - - staff to call with information   Time spent with patient was 25 minutes. Greater than 50% of face to face time with patient was spent on counseling and coordination of care.    RTC 6 weeks  Patient advised to contact office with any questions, adverse effects, or acute worsening in signs and symptoms.   Discussed potential benefits, risk, and side effects of benzodiazepines to include potential risk of tolerance and dependence, as well as possible drowsiness.  Advised patient not to drive if experiencing drowsiness and to take lowest possible effective dose to minimize risk of dependence and tolerance.   There are no diagnoses linked to this encounter.   Please see After Visit Summary for patient specific instructions.  Future Appointments  Date Time Provider Department Center  06/23/2022  3:00 PM Yaslyn Cumby, , NP [facility] None  09/15/2022  3:45 PM , NP [facility] None    No orders of the  defined types were placed in this encounter.     -------------------------------

## 2022-07-29 ENCOUNTER — Encounter: Payer: Self-pay | Admitting: Adult Health

## 2022-07-29 ENCOUNTER — Telehealth (INDEPENDENT_AMBULATORY_CARE_PROVIDER_SITE_OTHER): Payer: Medicare HMO | Admitting: Adult Health

## 2022-07-29 DIAGNOSIS — F410 Panic disorder [episodic paroxysmal anxiety] without agoraphobia: Secondary | ICD-10-CM

## 2022-07-29 DIAGNOSIS — F411 Generalized anxiety disorder: Secondary | ICD-10-CM

## 2022-07-29 DIAGNOSIS — F4310 Post-traumatic stress disorder, unspecified: Secondary | ICD-10-CM

## 2022-07-29 DIAGNOSIS — G4700 Insomnia, unspecified: Secondary | ICD-10-CM

## 2022-07-29 DIAGNOSIS — F331 Major depressive disorder, recurrent, moderate: Secondary | ICD-10-CM

## 2022-07-29 NOTE — Progress Notes (Signed)
LARRY LEAHEY [id number] 20-May-1956 67 y.o.  Subjective:   Patient ID:  [name] is a 67 y.o. (DOB 14-Jun-1955) female.  Chief Complaint: No chief complaint on file.   HPI [name] presents to the office today for follow-up of MDD, [name], [name], insomnia and panic attacks.  Describes mood today as "not the best". Pleasant. Tearful at times. Mood symptoms - reports depression, anxiety, and irritability. Denies recent panic attacks. Reports worry, rumination, and over thinking. Mood is lower. Does not feel like the Rexulti is working for her anymore. Concerned about weight gain. BGS and A1C has been elevated. Would like to explore other options. Varying interest and motivation. Taking medications as prescribed. Followed for multiple medical issues. Energy levels lower. Active, does not have a regular exercise routine with physical disabilities. Enjoys some usual interests and activities. Divorced from husband of 39 years, but lives with ex-husband and his mother - 28 years old. Has a dog - "[name]". Has 3 grown children. Spending time with family. Appetite adequate. Weight gain - 207 to 231 pounds - 63". Sleeping difficulties. Averages 7 hours - broken sleep - up and down 5 out of 7 nights.. Focus and concentration difficulties. Completing tasks. Managing aspects of household. 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  Denies AH or VH. Denies self harm. Denies substance abuse. Reporting some paranoia - feels like people are going to hurt when she is out in public - not at home.  Chronic pain - working with pain management.  Seeing Dr. Jaynee Eagles - managing migraines  Previous medication trials: Cymbalta, Prozac   Flowsheet Row ED to Hosp-Admission (Discharged) from 12/22/2021 in [place] [facility] from 09/23/2021 in [facility] Admission (Discharged) from 04/12/2021 in [facility] No Risk No Risk No Risk        Review of Systems:  Review of Systems  Musculoskeletal:  Negative for gait problem.  Neurological:  Negative for tremors.  Psychiatric/Behavioral:         Please refer to HPI    Medications: I have reviewed the patient's current medications.  Current Outpatient Medications  Medication Sig Dispense Refill   Ascorbic Acid (VITAMIN C) 1000 MG tablet Take 1,000 mg by mouth daily.     brexpiprazole (REXULTI) 1 MG TABS tablet Take 1 tablet (1 mg total) by mouth daily. 30 tablet 2   buPROPion (WELLBUTRIN SR) 150 MG 12 hr tablet Take 1 tablet (150 mg total) by mouth 2 (two) times daily. 180 tablet 3   busPIRone (BUSPAR) 10 MG tablet Take 1.5 tablets (15 mg total) by mouth 2 (two) times daily. 60 tablet 5   Cholecalciferol (VITAMIN D) 125 MCG (5000 UT) CAPS Take 2 capsules by mouth daily.     COLLAGEN PO Take 6,000 mg by mouth daily.     cyanocobalamin 2000 MCG tablet Take 2,000 mcg by mouth daily.     estradiol (ESTRACE) 0.5 MG tablet Take 0.5 mg by mouth daily.     ferrous gluconate (FERGON) 324 MG tablet Take 324 mg by mouth daily with breakfast.     fluconazole (DIFLUCAN) 150 MG tablet Take 150 mg by mouth once a week.     FLUoxetine (PROZAC) 40 MG capsule Take 1 capsule (40 mg total) by mouth daily. 90 capsule 3   Fremanezumab-vfrm (AJOVY) 225 MG/1.5ML SOAJ Inject 225 mg into the skin every 30 (  thirty) days. 1.5 mL 11   Fremanezumab-vfrm (AJOVY) 225 MG/1.5ML SOAJ Inject 225 mg into the skin every 30 (thirty) days. 3 mL 11   furosemide (LASIX) 20 MG tablet Take 20 mg by mouth every Monday, Wednesday, and Friday.     gabapentin (NEURONTIN) 600 MG tablet Take 600 mg by mouth in the morning, at noon, in the evening, and at bedtime.     insulin aspart (NOVOLOG) 100 UNIT/ML injection Inject 5-8 Units into the skin 3 (three) times daily before meals.     Insulin Glargine-Lixisenatide (SOLIQUA) 100-33 UNT-MCG/ML SOPN Inject 32 Units into the skin at  bedtime.     levocetirizine (XYZAL) 5 MG tablet Take 5 mg by mouth every evening.     levothyroxine (SYNTHROID) 175 MCG tablet Take 175 mcg by mouth daily before breakfast.     LORazepam (ATIVAN) 0.5 MG tablet Take one to 2 tablets daily as needed for panic attacks. 60 tablet 2   lovastatin (MEVACOR) 40 MG tablet Take 40 mg by mouth at bedtime.     meclizine (ANTIVERT) 25 MG tablet Take 25 mg by mouth 3 (three) times daily as needed for dizziness.     metroNIDAZOLE (FLAGYL) 500 MG tablet Take 1 tablet (500 mg total) by mouth every 12 (twelve) hours. 8 tablet 0   montelukast (SINGULAIR) 10 MG tablet Take 10 mg by mouth daily.     ondansetron (ZOFRAN) 4 MG tablet Take 4 mg by mouth 3 (three) times daily as needed for refractory nausea / vomiting.     oxybutynin (DITROPAN) 5 MG tablet Take 5 mg by mouth daily.     oxyCODONE (OXY IR/ROXICODONE) 5 MG immediate release tablet Take 1-2 tablets (5-10 mg total) by mouth every 6 (six) hours as needed for breakthrough pain. (Patient taking differently: Take 15 mg by mouth every 6 (six) hours as needed for breakthrough pain.) 28 tablet 0   oxyCODONE (ROXICODONE) 15 MG immediate release tablet Take 15 mg by mouth.     pantoprazole (PROTONIX) 40 MG tablet Take 40 mg by mouth daily.     rizatriptan (MAXALT) 10 MG tablet Take 10 mg by mouth every 6 (six) hours as needed for migraine. May repeat in 2 hours if needed     tiZANidine (ZANAFLEX) 4 MG capsule Take 4 mg by mouth in the morning and at bedtime.     traZODone (DESYREL) 100 MG tablet Take 1 tablet (100 mg total) by mouth at bedtime. 90 tablet 3   valACYclovir (VALTREX) 500 MG tablet Take 500 mg by mouth 2 (two) times daily as needed (shingles).     zinc gluconate 50 MG tablet Take 50 mg by mouth daily.     Current Facility-Administered Medications  Medication Dose Route Frequency Provider Last Rate Last Admin   Fremanezumab-vfrm SOSY 225 mg  225 mg Subcutaneous Once Melvenia Beam, MD         Medication Side Effects: None  Allergies:  Allergies  Allergen Reactions   Hydromorphone Shortness Of Breath    Respiratory arrest per patient Other reaction(s): coded with fast admin Note: Imported from external source.   Penicillins Anaphylaxis   Erythromycin Base Nausea And Vomiting   Ibuprofen Other (See Comments)    Per pt, it makes her bleed   Liraglutide    Meperidine Hives   Nsaids     Other reaction(s): GI bleed Note: Imported from external source.    Past Medical History:  Diagnosis Date   Anxiety  Asthma    Chronic pain    Depression    Diabetes mellitus without complication (HCC)    GERD (gastroesophageal reflux disease)    Headache    migraines   Hyperlipidemia    Hypertension    Hypothyroidism    Osteoarthritis    PONV (postoperative nausea and vomiting)     Past Medical History, Surgical history, Social history, and Family history were reviewed and updated as appropriate.   Please see review of systems for further details on the patient's review from today.   Objective:   Physical Exam:  There were no vitals taken for this visit.  Physical Exam Constitutional:      General: She is not in acute distress. Musculoskeletal:        General: No deformity.  Neurological:     Mental Status: She is alert and oriented to person, place, and time.     Coordination: Coordination normal.  Psychiatric:        Attention and Perception: Attention and perception normal. She does not perceive auditory or visual hallucinations.        Mood and Affect: Mood normal. Mood is not anxious or depressed. Affect is not labile, blunt, angry or inappropriate.        Speech: Speech normal.        Behavior: Behavior normal.        Thought Content: Thought content normal. Thought content is not paranoid or delusional. Thought content does not include homicidal or suicidal ideation. Thought content does not include homicidal or suicidal plan.        Cognition and  Memory: Cognition and memory normal.        Judgment: Judgment normal.     Comments: Insight intact     Lab Review:     Component Value Date/Time   NA 138 12/24/2021 0452   K 3.4 (L) 12/24/2021 0452   CL 105 12/24/2021 0452   CO2 26 12/24/2021 0452   GLUCOSE 159 (H) 12/24/2021 0452   BUN 7 (L) 12/24/2021 0452   CREATININE 0.56 12/24/2021 0452   CALCIUM 8.4 (L) 12/24/2021 0452   PROT 5.8 (L) 12/23/2021 0527   ALBUMIN 2.8 (L) 12/23/2021 0527   AST 16 12/23/2021 0527   ALT 15 12/23/2021 0527   ALKPHOS 54 12/23/2021 0527   BILITOT 0.6 12/23/2021 0527   GFRNONAA >60 12/24/2021 0452       Component Value Date/Time   WBC 12.4 (H) 12/24/2021 0452   RBC 4.36 12/24/2021 0452   HGB 12.8 12/24/2021 0452   HCT 40.1 12/24/2021 0452   PLT 182 12/24/2021 0452   MCV 92.0 12/24/2021 0452   MCH 29.4 12/24/2021 0452   MCHC 31.9 12/24/2021 0452   RDW 12.9 12/24/2021 0452   LYMPHSABS 1.6 12/22/2021 0800   MONOABS 0.8 12/22/2021 0800   EOSABS 0.0 12/22/2021 0800   BASOSABS 0.1 12/22/2021 0800    No results found for: "POCLITH", "LITHIUM"   No results found for: "PHENYTOIN", "PHENOBARB", "VALPROATE", "CBMZ"   .res Assessment: Plan:    Plan:  PDMP reviewed  Buspar 2m BID - PCP Wellbutrin SR 1561mBID - PCP   Prozac 4023maily  Trazadone 100m72m bedtime Ativan 0.5mg 53m daily prn as needed  Decrease Rexulti - 1mg t44m/2 mg daily x 7 days, then discontinue. - receives patient assistance. Add Vraylar 1.5 mg daily - samples given  Time spent with patient was 25 minutes. Greater than 50% of face to face time with patient  was spent on counseling and coordination of care.    RTC 6 weeks  Patient advised to contact office with any questions, adverse effects, or acute worsening in signs and symptoms.   Discussed potential benefits, risk, and side effects of benzodiazepines to include potential risk of tolerance and dependence, as well as possible drowsiness.  Advised patient  not to drive if experiencing drowsiness and to take lowest possible effective dose to minimize risk of dependence and tolerance.  There are no diagnoses linked to this encounter.   Please see After Visit Summary for patient specific instructions.  Future Appointments  Date Time Provider [place]  09/15/2022  3:45 PM Suzzanne Cloud, NP [facility] None    No orders of the defined types were placed in this encounter.   -------------------------------

## 2022-08-02 ENCOUNTER — Telehealth: Payer: Self-pay | Admitting: Neurology

## 2022-08-02 NOTE — Telephone Encounter (Signed)
LVM and sent mychart msg informing pt of need to reschedule 09/15/22 appointment - NP out

## 2022-08-17 ENCOUNTER — Ambulatory Visit (INDEPENDENT_AMBULATORY_CARE_PROVIDER_SITE_OTHER): Payer: Medicare HMO | Admitting: Podiatry

## 2022-08-17 DIAGNOSIS — T148XXA Other injury of unspecified body region, initial encounter: Secondary | ICD-10-CM

## 2022-08-17 MED ORDER — DOXYCYCLINE HYCLATE 100 MG PO TABS: 100.0000 mg | ORAL_TABLET | Freq: Two times a day (BID) | ORAL | 0 refills | Status: DC

## 2022-08-17 NOTE — Progress Notes (Signed)
Subjective:  Patient ID: [name], female    DOB: 1955-09-09,  MRN: [id number]  Chief Complaint  Patient presents with   Diabetes    Right outer heel has a red place on it that is causing discomfort     67 y.o. female presents with the above complaint.  Patient presents with right heel outer friction blister.  Patient states it came out of nowhere.  She says she has new shoes that she may have tried on.  That could have led to it.  There is some redness associated with it.  She is not taking an antibiotic she has not seen anyone as prior to seeing me denies any other acute complaints.  She wants to make sure she did not step on anything.   Review of Systems: Negative except as noted in the HPI. Denies N/V/F/Ch.  Past Medical History:  Diagnosis Date   Anxiety    Asthma    Chronic pain    Depression    Diabetes mellitus without complication (HCC)    GERD (gastroesophageal reflux disease)    Headache    migraines   Hyperlipidemia    Hypertension    Hypothyroidism    Osteoarthritis    PONV (postoperative nausea and vomiting)     Current Outpatient Medications:    doxycycline (VIBRA-TABS) 100 MG tablet, Take 1 tablet (100 mg total) by mouth 2 (two) times daily., Disp: 20 tablet, Rfl: 0   Ascorbic Acid (VITAMIN C) 1000 MG tablet, Take 1,000 mg by mouth daily., Disp: , Rfl:    brexpiprazole (REXULTI) 1 MG TABS tablet, Take 1 tablet (1 mg total) by mouth daily., Disp: 30 tablet, Rfl: 2   buPROPion (WELLBUTRIN SR) 150 MG 12 hr tablet, Take 1 tablet (150 mg total) by mouth 2 (two) times daily., Disp: 180 tablet, Rfl: 3   busPIRone (BUSPAR) 10 MG tablet, Take 1.5 tablets (15 mg total) by mouth 2 (two) times daily., Disp: 60 tablet, Rfl: 5   Cholecalciferol (VITAMIN D) 125 MCG (5000 UT) CAPS, Take 2 capsules by mouth daily., Disp: , Rfl:    COLLAGEN PO, Take 6,000 mg by mouth daily., Disp: , Rfl:    cyanocobalamin 2000 MCG tablet, Take 2,000 mcg by mouth daily., Disp: , Rfl:     estradiol (ESTRACE) 0.5 MG tablet, Take 0.5 mg by mouth daily., Disp: , Rfl:    ferrous gluconate (FERGON) 324 MG tablet, Take 324 mg by mouth daily with breakfast., Disp: , Rfl:    fluconazole (DIFLUCAN) 150 MG tablet, Take 150 mg by mouth once a week., Disp: , Rfl:    FLUoxetine (PROZAC) 40 MG capsule, Take 1 capsule (40 mg total) by mouth daily., Disp: 90 capsule, Rfl: 3   Fremanezumab-vfrm (AJOVY) 225 MG/1.5ML SOAJ, Inject 225 mg into the skin every 30 (thirty) days., Disp: 1.5 mL, Rfl: 11   Fremanezumab-vfrm (AJOVY) 225 MG/1.5ML SOAJ, Inject 225 mg into the skin every 30 (thirty) days., Disp: 3 mL, Rfl: 11   furosemide (LASIX) 20 MG tablet, Take 20 mg by mouth every Monday, Wednesday, and Friday., Disp: , Rfl:    gabapentin (NEURONTIN) 600 MG tablet, Take 600 mg by mouth in the morning, at noon, in the evening, and at bedtime., Disp: , Rfl:    insulin aspart (NOVOLOG) 100 UNIT/ML injection, Inject 5-8 Units into the skin 3 (three) times daily before meals., Disp: , Rfl:    Insulin Glargine-Lixisenatide (SOLIQUA) 100-33 UNT-MCG/ML SOPN, Inject 32 Units into the skin at bedtime.,  Disp: , Rfl:    levocetirizine (XYZAL) 5 MG tablet, Take 5 mg by mouth every evening., Disp: , Rfl:    levothyroxine (SYNTHROID) 175 MCG tablet, Take 175 mcg by mouth daily before breakfast., Disp: , Rfl:    LORazepam (ATIVAN) 0.5 MG tablet, Take one to 2 tablets daily as needed for panic attacks., Disp: 60 tablet, Rfl: 2   lovastatin (MEVACOR) 40 MG tablet, Take 40 mg by mouth at bedtime., Disp: , Rfl:    meclizine (ANTIVERT) 25 MG tablet, Take 25 mg by mouth 3 (three) times daily as needed for dizziness., Disp: , Rfl:    metroNIDAZOLE (FLAGYL) 500 MG tablet, Take 1 tablet (500 mg total) by mouth every 12 (twelve) hours., Disp: 8 tablet, Rfl: 0   montelukast (SINGULAIR) 10 MG tablet, Take 10 mg by mouth daily., Disp: , Rfl:    ondansetron (ZOFRAN) 4 MG tablet, Take 4 mg by mouth 3 (three) times daily as needed for  refractory nausea / vomiting., Disp: , Rfl:    oxybutynin (DITROPAN) 5 MG tablet, Take 5 mg by mouth daily., Disp: , Rfl:    oxyCODONE (OXY IR/ROXICODONE) 5 MG immediate release tablet, Take 1-2 tablets (5-10 mg total) by mouth every 6 (six) hours as needed for breakthrough pain. (Patient taking differently: Take 15 mg by mouth every 6 (six) hours as needed for breakthrough pain.), Disp: 28 tablet, Rfl: 0   oxyCODONE (ROXICODONE) 15 MG immediate release tablet, Take 15 mg by mouth., Disp: , Rfl:    pantoprazole (PROTONIX) 40 MG tablet, Take 40 mg by mouth daily., Disp: , Rfl:    rizatriptan (MAXALT) 10 MG tablet, Take 10 mg by mouth every 6 (six) hours as needed for migraine. May repeat in 2 hours if needed, Disp: , Rfl:    tiZANidine (ZANAFLEX) 4 MG capsule, Take 4 mg by mouth in the morning and at bedtime., Disp: , Rfl:    traZODone (DESYREL) 100 MG tablet, Take 1 tablet (100 mg total) by mouth at bedtime., Disp: 90 tablet, Rfl: 3   valACYclovir (VALTREX) 500 MG tablet, Take 500 mg by mouth 2 (two) times daily as needed (shingles)., Disp: , Rfl:    zinc gluconate 50 MG tablet, Take 50 mg by mouth daily., Disp: , Rfl:   Current Facility-Administered Medications:    Fremanezumab-vfrm SOSY 225 mg, 225 mg, Subcutaneous, Once, Melvenia Beam, MD  Social History   Tobacco Use  Smoking Status Never   Passive exposure: Never  Smokeless Tobacco Never    Allergies  Allergen Reactions   Hydromorphone Shortness Of Breath    Respiratory arrest per patient Other reaction(s): coded with fast admin Note: Imported from external source.   Penicillins Anaphylaxis   Erythromycin Base Nausea And Vomiting   Ibuprofen Other (See Comments)    Per pt, it makes her bleed   Liraglutide    Meperidine Hives   Nsaids     Other reaction(s): GI bleed Note: Imported from external source.   Objective:  There were no vitals filed for this visit. There is no height or weight on file to calculate  BMI. Constitutional Well developed. Well nourished.  Vascular Dorsalis pedis pulses palpable bilaterally. Posterior tibial pulses palpable bilaterally. Capillary refill normal to all digits.  No cyanosis or clubbing noted. Pedal hair growth normal.  Neurologic Normal speech. Oriented to person, place, and time. Epicritic sensation to light touch grossly present bilaterally.  Dermatologic Friction blister noted to the right lateral foot.  No deep breakdown  noted.  No concern for foreign body noted.  Mild erythema noted circumferentially  Orthopedic: Normal joint ROM without pain or crepitus bilaterally. No visible deformities. No bony tenderness.   Radiographs: None Assessment:   1. Friction blister    Plan:  Patient was evaluated and treated and all questions answered.  Right heel friction blister -All questions and concerns were discussed with the patient in extensive detail.  Given that there is mild erythema present in setting of diabetes with last A1c of 8% patient will benefit from doxycycline.  Doxycycline was sent to the pharmacy. -I discussed with the patient the importance of shoe gear modification.  She states understanding. -If it continues to regress patient will come back and see me.  No follow-ups on file.

## 2022-08-19 ENCOUNTER — Encounter: Payer: Self-pay | Admitting: Adult Health

## 2022-08-19 ENCOUNTER — Telehealth (INDEPENDENT_AMBULATORY_CARE_PROVIDER_SITE_OTHER): Payer: Medicare HMO | Admitting: Adult Health

## 2022-08-19 DIAGNOSIS — F331 Major depressive disorder, recurrent, moderate: Secondary | ICD-10-CM | POA: Diagnosis not present

## 2022-08-19 DIAGNOSIS — F411 Generalized anxiety disorder: Secondary | ICD-10-CM

## 2022-08-19 DIAGNOSIS — F410 Panic disorder [episodic paroxysmal anxiety] without agoraphobia: Secondary | ICD-10-CM | POA: Diagnosis not present

## 2022-08-19 DIAGNOSIS — G4700 Insomnia, unspecified: Secondary | ICD-10-CM

## 2022-08-19 DIAGNOSIS — F4310 Post-traumatic stress disorder, unspecified: Secondary | ICD-10-CM

## 2022-08-19 NOTE — Progress Notes (Signed)
ARVA YOUSIF [id number] 1956-05-17 67 y.o.  Virtual Visit via Video Note  I connected with pt @ on 08/19/22 at  2:00 PM EST by a video enabled telemedicine application and verified that I am speaking with the correct person using two identifiers.   I discussed the limitations of evaluation and management by telemedicine and the availability of in person appointments. The patient expressed understanding and agreed to proceed.  I discussed the assessment and treatment plan with the patient. The patient was provided an opportunity to ask questions and all were answered. The patient agreed with the plan and demonstrated an understanding of the instructions.   The patient was advised to call back or seek an in-person evaluation if the symptoms worsen or if the condition fails to improve as anticipated.  I provided 25 minutes of non-face-to-face time during this encounter.  The patient was located at home.  The provider was located at [place].   Aloha Gell, NP   Subjective:   Patient ID:  [name] is a 67 y.o. (DOB May 14, 1956) female.  Chief Complaint: No chief complaint on file.   [name], [name], [name], insomnia and panic attacks.  Describes mood today as "not the best". Pleasant. Tearful at times. Mood symptoms - reports decreased anxiety - decreased Lorazepam use. Reports depression and irritability. Denies recent panic attacks. Reports worry, rumination, and over thinking. Mood is lower - "about the same". Has started on the Vraylar and feels it has been helpful - would lie to increase dose to '3mg'$  daily. Varying interest and motivation. Taking medications as prescribed. Followed for multiple medical issues. Energy levels "not as good as it was". Active, does not have a regular exercise routine with physical disabilities. Enjoys some usual interests and activities. Divorced from husband of 73 years, but lives with  ex-husband and his mother - 23 years old. Has a dog - "[name]". Has 3 grown children. Spending time with family. Appetite adequate. Weight gain - 230 pounds - 63". Sleeping improved. Averages 8 to 9 hours. Focus and concentration difficulties - getting distracted easily. Completing tasks. Managing aspects of household. 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  Denies AH or VH. Denies self harm. Denies substance abuse. Reporting some paranoia - feels like people are going to hurt when she is out in public - not at home.  Chronic pain - working with pain management.  Seeing Dr. Jaynee Eagles - managing migraines  Previous medication trials: Cymbalta, Prozac   Review of Systems:  Review of Systems  Musculoskeletal:  Negative for gait problem.  Neurological:  Negative for tremors.  Psychiatric/Behavioral:         Please refer to HPI    Medications: I have reviewed the patient's current medications.  Current Outpatient Medications  Medication Sig Dispense Refill   Ascorbic Acid (VITAMIN C) 1000 MG tablet Take 1,000 mg by mouth daily.     brexpiprazole (REXULTI) 1 MG TABS tablet Take 1 tablet (1 mg total) by mouth daily. 30 tablet 2   buPROPion (WELLBUTRIN SR) 150 MG 12 hr tablet Take 1 tablet (150 mg total) by mouth 2 (two) times daily. 180 tablet 3   busPIRone (BUSPAR) 10 MG tablet Take 1.5 tablets (15 mg total) by mouth 2 (two) times daily. 60 tablet 5   Cholecalciferol (VITAMIN D) 125 MCG (5000 UT) CAPS Take 2 capsules by mouth daily.  COLLAGEN PO Take 6,000 mg by mouth daily.     cyanocobalamin 2000 MCG tablet Take 2,000 mcg by mouth daily.     doxycycline (VIBRA-TABS) 100 MG tablet Take 1 tablet (100 mg total) by mouth 2 (two) times daily. 20 tablet 0   estradiol (ESTRACE) 0.5 MG tablet Take 0.5 mg by mouth daily.     ferrous gluconate (FERGON) 324 MG tablet Take 324 mg by mouth daily with breakfast.     fluconazole (DIFLUCAN) 150 MG tablet Take 150 mg by  mouth once a week.     FLUoxetine (PROZAC) 40 MG capsule Take 1 capsule (40 mg total) by mouth daily. 90 capsule 3   Fremanezumab-vfrm (AJOVY) 225 MG/1.5ML SOAJ Inject 225 mg into the skin every 30 (thirty) days. 1.5 mL 11   Fremanezumab-vfrm (AJOVY) 225 MG/1.5ML SOAJ Inject 225 mg into the skin every 30 (thirty) days. 3 mL 11   furosemide (LASIX) 20 MG tablet Take 20 mg by mouth every Monday, Wednesday, and Friday.     gabapentin (NEURONTIN) 600 MG tablet Take 600 mg by mouth in the morning, at noon, in the evening, and at bedtime.     insulin aspart (NOVOLOG) 100 UNIT/ML injection Inject 5-8 Units into the skin 3 (three) times daily before meals.     Insulin Glargine-Lixisenatide (SOLIQUA) 100-33 UNT-MCG/ML SOPN Inject 32 Units into the skin at bedtime.     levocetirizine (XYZAL) 5 MG tablet Take 5 mg by mouth every evening.     levothyroxine (SYNTHROID) 175 MCG tablet Take 175 mcg by mouth daily before breakfast.     LORazepam (ATIVAN) 0.5 MG tablet Take one to 2 tablets daily as needed for panic attacks. 60 tablet 2   lovastatin (MEVACOR) 40 MG tablet Take 40 mg by mouth at bedtime.     meclizine (ANTIVERT) 25 MG tablet Take 25 mg by mouth 3 (three) times daily as needed for dizziness.     metroNIDAZOLE (FLAGYL) 500 MG tablet Take 1 tablet (500 mg total) by mouth every 12 (twelve) hours. 8 tablet 0   montelukast (SINGULAIR) 10 MG tablet Take 10 mg by mouth daily.     ondansetron (ZOFRAN) 4 MG tablet Take 4 mg by mouth 3 (three) times daily as needed for refractory nausea / vomiting.     oxybutynin (DITROPAN) 5 MG tablet Take 5 mg by mouth daily.     oxyCODONE (OXY IR/ROXICODONE) 5 MG immediate release tablet Take 1-2 tablets (5-10 mg total) by mouth every 6 (six) hours as needed for breakthrough pain. (Patient taking differently: Take 15 mg by mouth every 6 (six) hours as needed for breakthrough pain.) 28 tablet 0   oxyCODONE (ROXICODONE) 15 MG immediate release tablet Take 15 mg by mouth.      pantoprazole (PROTONIX) 40 MG tablet Take 40 mg by mouth daily.     rizatriptan (MAXALT) 10 MG tablet Take 10 mg by mouth every 6 (six) hours as needed for migraine. May repeat in 2 hours if needed     tiZANidine (ZANAFLEX) 4 MG capsule Take 4 mg by mouth in the morning and at bedtime.     traZODone (DESYREL) 100 MG tablet Take 1 tablet (100 mg total) by mouth at bedtime. 90 tablet 3   valACYclovir (VALTREX) 500 MG tablet Take 500 mg by mouth 2 (two) times daily as needed (shingles).     zinc gluconate 50 MG tablet Take 50 mg by mouth daily.     Current Facility-Administered Medications  Medication  Dose Route Frequency Provider Last Rate Last Admin   Fremanezumab-vfrm SOSY 225 mg  225 mg Subcutaneous Once Melvenia Beam, MD        Medication Side Effects: None  Allergies:  Allergies  Allergen Reactions   Hydromorphone Shortness Of Breath    Respiratory arrest per patient Other reaction(s): coded with fast admin Note: Imported from external source.   Penicillins Anaphylaxis   Erythromycin Base Nausea And Vomiting   Ibuprofen Other (See Comments)    Per pt, it makes her bleed   Liraglutide    Meperidine Hives   Nsaids     Other reaction(s): GI bleed Note: Imported from external source.    Past Medical History:  Diagnosis Date   Anxiety    Asthma    Chronic pain    Depression    Diabetes mellitus without complication (HCC)    GERD (gastroesophageal reflux disease)    Headache    migraines   Hyperlipidemia    Hypertension    Hypothyroidism    Osteoarthritis    PONV (postoperative nausea and vomiting)     No family history on file.  Social History   Socioeconomic History   Marital status: Divorced    Spouse name: Not on file   Number of children: Not on file   Years of education: Not on file   Highest education level: Not on file  Occupational History   Not on file  Tobacco Use   Smoking status: Never    Passive exposure: Never   Smokeless tobacco: Never   Vaping Use   Vaping Use: Never used  Substance and Sexual Activity   Alcohol use: Never   Drug use: Never   Sexual activity: Not Currently    Birth control/protection: None  Other Topics Concern   Not on file  Social History Narrative   Caffiene 1-2 cups daily.   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    Social Determinants of Health   Financial Resource Strain: Not on file  Food Insecurity: Not on file  Transportation Needs: Not on file  Physical Activity: Not on file  Stress: Not on file  Social Connections: Not on file  Intimate Partner Violence: Not on file    Past Medical History, Surgical history, Social history, and Family history were reviewed and updated as appropriate.   Please see review of systems for further details on the patient's review from today.   Objective:   Physical Exam:  There were no vitals taken for this visit.  Physical Exam Constitutional:      General: She is not in acute distress. Musculoskeletal:        General: No deformity.  Neurological:     Mental Status: She is alert and oriented to person, place, and time.     Coordination: Coordination normal.  Psychiatric:        Attention and Perception: Attention and perception normal. She does not perceive auditory or visual hallucinations.        Mood and Affect: Mood normal. Mood is not anxious or depressed. Affect is not labile, blunt, angry or inappropriate.        Speech: Speech normal.        Behavior: Behavior normal.        Thought Content: Thought content normal. Thought content is not paranoid or delusional. Thought content does not include homicidal or suicidal ideation. Thought content does not include homicidal or suicidal  plan.        Cognition and Memory: Cognition and memory normal.        Judgment: Judgment normal.     Comments: Insight intact     Lab Review:     Component Value Date/Time   NA 138 12/24/2021 0452   K 3.4 (L) 12/24/2021  0452   CL 105 12/24/2021 0452   CO2 26 12/24/2021 0452   GLUCOSE 159 (H) 12/24/2021 0452   BUN 7 (L) 12/24/2021 0452   CREATININE 0.56 12/24/2021 0452   CALCIUM 8.4 (L) 12/24/2021 0452   PROT 5.8 (L) 12/23/2021 0527   ALBUMIN 2.8 (L) 12/23/2021 0527   AST 16 12/23/2021 0527   ALT 15 12/23/2021 0527   ALKPHOS 54 12/23/2021 0527   BILITOT 0.6 12/23/2021 0527   GFRNONAA >60 12/24/2021 0452       Component Value Date/Time   WBC 12.4 (H) 12/24/2021 0452   RBC 4.36 12/24/2021 0452   HGB 12.8 12/24/2021 0452   HCT 40.1 12/24/2021 0452   PLT 182 12/24/2021 0452   MCV 92.0 12/24/2021 0452   MCH 29.4 12/24/2021 0452   MCHC 31.9 12/24/2021 0452   RDW 12.9 12/24/2021 0452   LYMPHSABS 1.6 12/22/2021 0800   MONOABS 0.8 12/22/2021 0800   EOSABS 0.0 12/22/2021 0800   BASOSABS 0.1 12/22/2021 0800    No results found for: "POCLITH", "LITHIUM"   No results found for: "PHENYTOIN", "PHENOBARB", "VALPROATE", "CBMZ"   .res Assessment: Plan:    Plan:  PDMP reviewed  Buspar '15mg'$  BID - PCP Wellbutrin SR '150mg'$  BID - PCP  Prozac '40mg'$  daily  Trazadone '100mg'$  at bedtime Ativan 0.'5mg'$  BID daily prn as needed  Increase Vraylar 1.'5mg'$  to '3mg'$  daily - samples given  Time spent with patient was 25 minutes. Greater than 50% of face to face time with patient was spent on counseling and coordination of care.    RTC 6 weeks  Patient advised to contact office with any questions, adverse effects, or acute worsening in signs and symptoms.   Discussed potential benefits, risk, and side effects of benzodiazepines to include potential risk of tolerance and dependence, as well as possible drowsiness.  Advised patient not to drive if experiencing drowsiness and to take lowest possible effective dose to minimize risk of dependence and tolerance.  There are no diagnoses linked to this encounter.   Please see After Visit Summary for patient specific instructions.  Future Appointments  Date Time Provider  [address]  09/20/2022  1:15 PM Suzzanne Cloud, NP [facility] None    No orders of the defined types were placed in this encounter.     -------------------------------

## 2022-09-14 ENCOUNTER — Telehealth: Payer: Self-pay | Admitting: Adult Health

## 2022-09-14 NOTE — Telephone Encounter (Signed)
We can give her a months worth and need to get [name] to start patient assistance. She may already be working on it for her.

## 2022-09-14 NOTE — Telephone Encounter (Signed)
Pt will be out of Vraylar 3 mg next Wednesday. Requesting more samples. Please advise Pt when ready for pick up. [phone]. Apt 4/15

## 2022-09-15 ENCOUNTER — Ambulatory Visit: Payer: Medicare HMO | Admitting: Neurology

## 2022-09-15 ENCOUNTER — Telehealth: Payer: Self-pay | Admitting: Adult Health

## 2022-09-15 NOTE — Telephone Encounter (Signed)
Pt notified. Pt will pick up 4/8. Pt needs paperwork for Vraylar assistance per provider.

## 2022-09-15 NOTE — Telephone Encounter (Signed)
Please see message from [place].

## 2022-09-15 NOTE — Telephone Encounter (Signed)
Guy has been started on Vraylar and is inquiring about pt. Assistance, but we don't know the cost of the medication through insurance.  Please send a prescription to her local pharmacy so they can run it.  It will probably need a PA.  Perhaps do the PA prior to sending the prescription so when the pharmacy runs it, the true cost will come up.  She is on medicare so can't use a coiupon.  Knowing the cost will help [place] go forward.

## 2022-09-19 NOTE — Progress Notes (Unsigned)
Patient: [name] Date of Birth: 1956-03-26  Reason for Visit: Follow up History from: Patient Primary Neurologist: Lucia Gaskins   ASSESSMENT AND PLAN 67 y.o. year old female   1.  Chronic migraine headaches 2.  Chronic pain 3.  Gait instability  -Printed patient assistance application for Ajovy she will complete in office portion, we will send our portion today, I gave her # 2 samples of Ajovy ([id number] 5/26) -If she does not qualify for patient assistance, may have to try Botox, I am hesitant to give up on Ajovy as it has been 95% helpful!  I am happy to help with samples, but cannot guarantee when I may have them -Currently having migraine, gave sample of Nurtec to hopefully break cycle, usually Maxalt and Tylenol work very well Luisa Dago [phone] 5/26) -She wants to hold off on sleep study, revisit in 6 months -Follow-up in 6 months or sooner if needed  HISTORY OF PRESENT ILLNESS: Today 09/20/22 When she saw Dr. Lucia Gaskins in November given Ajovy in office and 3 samples. It tremendously helped her migraines 95%. She called her insurance, they covered but her portion was going to be $300. With the Ajovy felt her balance improved, it continues to be better, having knee replacement in June on the right, likely contributes to imbalance.  Right now without Ajovy daily headache.  Had MRI cervical spine December 2023 showing mild degenerative changes C3-C4 through C6-C7.  No spinal stenosis or nerve root compression.  Referred for sleep study. Claims she never heard anything. Morning fatigue is not quite as bad. Husband says she snores.   Migraines are across forehead, in her eyes, sensitive to light, sound, nauseated, no vomiting. Takes Zofran. Takes Tylenol and Maxalt, it helps.   Goes to pain specialist for back pain.   HISTORY  05/12/22 Dr. Lucia Gaskins HPI:  [name] is a 67 y.o. female here as requested by Waldron Session, FNP for headaches,imbalance. PMHx GI bleed/rectal bleed,  constipation on chronic opiods, hypokalemia and hypocalcemia, colitis, hypertension, hyperlipidemia, diabetes type 2, iron deficiency anemia, depression, osteoarthritis of hips.  I reviewed Dr. Bernette Redbird notes, she presented to discuss possible referral to neurology for impaired balance and migraines/headaches, she reported her ability to ambulate is impaired because of her imbalance and her pain, she reports when she bends over and stands upright she is unable to take any steps due to feeling off balance, she reports that her pain is center provider recommended that she see neurology again since her initial fall the neck and back pain is continued to worsen as well, she is followed by Ortho for continued treatment of neck and back pain and followed by psych.   She fell backwards out of a chair in 03/2018 and since having chronic pain, headaches, worsening migraines. She has chronic neck pain, srays completely tense, ongoing for years, failed conservative treatment and has been under the care of pcp and ortho. Her migraines are on the right side, can spread across the forehead, pulsating/pounding/throbbing, photophobia, nausea, no vomiting. She has daily headaches, she "used" to have sleep apnea and lost 100 pounds and stopped using the cpap, she does have some morning headaches, and nocturnal headaches, she wake up tired, takes her to the afternoon to feel better, Jer imbalance is when she stands she feels like she has to stabilize and if she walks she gets dizzy, imbalance, falls at least once a month. She went ti PT for 3 months in 2019.    Reviewed  notes, labs and imaging from outside physicians, which showed:   From a thorough review of records, medications tried that can be used in migraine/headache management include: Tylenol, aspirin, BuSpar, Decadron, Prozac, gabapentin, meclizine, Robaxin, Reglan, Zofran, oxycodone, prednisone, Compazine, Phenergan, Maxalt, Imitrex, tizanidine, trazodone, amitriptyline  and nortriptyline and other tricyclic antidepressants contraindicated due to patient being on multiple serotonin drugs and risk of serotonin syndrome, trazodone, topiramate(did not help), aimovig contraindicated due to constipation, she has also tried amitriptyline, propranolol, topamax.   REVIEW OF SYSTEMS: Out of a complete 14 system review of symptoms, the patient complains only of the following symptoms, and all other reviewed systems are negative.  See HPI  ALLERGIES: Allergies  Allergen Reactions   Hydromorphone Shortness Of Breath and Other (See Comments)    Respiratory arrest per patient  Other reaction(s): coded with fast admin  Note: Imported from external source.   Penicillins Anaphylaxis   Erythromycin Base Nausea And Vomiting   Ibuprofen Other (See Comments)    Per pt, it makes her bleed   Liraglutide    Meperidine Hives   Nsaids     Other reaction(s): GI bleed Note: Imported from external source.    HOME MEDICATIONS: Outpatient Medications Prior to Visit  Medication Sig Dispense Refill   Ascorbic Acid (VITAMIN C) 1000 MG tablet Take 1,000 mg by mouth daily.     buPROPion (WELLBUTRIN SR) 150 MG 12 hr tablet Take 1 tablet (150 mg total) by mouth 2 (two) times daily. 180 tablet 3   busPIRone (BUSPAR) 10 MG tablet Take 1.5 tablets (15 mg total) by mouth 2 (two) times daily. 60 tablet 5   Cholecalciferol (VITAMIN D) 125 MCG (5000 UT) CAPS Take 2 capsules by mouth daily.     COLLAGEN PO Take 6,000 mg by mouth daily.     cyanocobalamin 2000 MCG tablet Take 2,000 mcg by mouth daily.     estradiol (ESTRACE) 0.5 MG tablet Take 0.5 mg by mouth daily.     ferrous gluconate (FERGON) 324 MG tablet Take 324 mg by mouth daily with breakfast.     fluconazole (DIFLUCAN) 150 MG tablet Take 150 mg by mouth once a week.     FLUoxetine (PROZAC) 40 MG capsule Take 1 capsule (40 mg total) by mouth daily. 90 capsule 3   furosemide (LASIX) 20 MG tablet Take 20 mg by mouth every Monday,  Wednesday, and Friday.     gabapentin (NEURONTIN) 600 MG tablet Take 600 mg by mouth in the morning, at noon, in the evening, and at bedtime.     insulin aspart (NOVOLOG) 100 UNIT/ML injection Inject 5-8 Units into the skin 3 (three) times daily before meals.     LANTUS SOLOSTAR 100 UNIT/ML Solostar Pen Inject into the skin.     levocetirizine (XYZAL) 5 MG tablet Take 5 mg by mouth every evening.     levothyroxine (SYNTHROID) 175 MCG tablet Take 175 mcg by mouth daily before breakfast.     LORazepam (ATIVAN) 0.5 MG tablet Take one to 2 tablets daily as needed for panic attacks. 60 tablet 2   lovastatin (MEVACOR) 40 MG tablet Take 40 mg by mouth at bedtime.     meclizine (ANTIVERT) 25 MG tablet Take 25 mg by mouth 3 (three) times daily as needed for dizziness.     metroNIDAZOLE (FLAGYL) 500 MG tablet Take 1 tablet (500 mg total) by mouth every 12 (twelve) hours. 8 tablet 0   montelukast (SINGULAIR) 10 MG tablet Take 10 mg by mouth  daily.     ondansetron (ZOFRAN) 4 MG tablet Take 4 mg by mouth 3 (three) times daily as needed for refractory nausea / vomiting.     oxybutynin (DITROPAN) 5 MG tablet Take 5 mg by mouth daily.     oxyCODONE (ROXICODONE) 15 MG immediate release tablet Take 15 mg by mouth.     OZEMPIC, 0.25 OR 0.5 MG/DOSE, 2 MG/3ML SOPN Inject 0.5 mg into the skin once a week.     pantoprazole (PROTONIX) 40 MG tablet Take 40 mg by mouth daily.     rizatriptan (MAXALT) 10 MG tablet Take 10 mg by mouth every 6 (six) hours as needed for migraine. May repeat in 2 hours if needed     tiZANidine (ZANAFLEX) 4 MG capsule Take 4 mg by mouth in the morning and at bedtime.     traZODone (DESYREL) 100 MG tablet Take 1 tablet (100 mg total) by mouth at bedtime. 90 tablet 3   valACYclovir (VALTREX) 500 MG tablet Take 500 mg by mouth 2 (two) times daily as needed (shingles).     zinc gluconate 50 MG tablet Take 50 mg by mouth daily.     Fremanezumab-vfrm (AJOVY) 225 MG/1.5ML SOAJ Inject 225 mg into the  skin every 30 (thirty) days. (Patient not taking: Reported on 09/20/2022) 1.5 mL 11   Fremanezumab-vfrm (AJOVY) 225 MG/1.5ML SOAJ Inject 225 mg into the skin every 30 (thirty) days. (Patient not taking: Reported on 09/20/2022) 3 mL 11   doxycycline (VIBRA-TABS) 100 MG tablet Take 1 tablet (100 mg total) by mouth 2 (two) times daily. 20 tablet 0   Insulin Glargine-Lixisenatide (SOLIQUA) 100-33 UNT-MCG/ML SOPN Inject 32 Units into the skin at bedtime.     oxyCODONE (OXY IR/ROXICODONE) 5 MG immediate release tablet Take 1-2 tablets (5-10 mg total) by mouth every 6 (six) hours as needed for breakthrough pain. (Patient taking differently: Take 15 mg by mouth every 6 (six) hours as needed for breakthrough pain.) 28 tablet 0   Facility-Administered Medications Prior to Visit  Medication Dose Route Frequency Provider Last Rate Last Admin   Fremanezumab-vfrm SOSY 225 mg  225 mg Subcutaneous Once Anson Fret, MD        PAST MEDICAL HISTORY: Past Medical History:  Diagnosis Date   Anxiety    Asthma    Chronic pain    Depression    Diabetes mellitus without complication    GERD (gastroesophageal reflux disease)    Headache    migraines   Hyperlipidemia    Hypertension    Hypothyroidism    Osteoarthritis    PONV (postoperative nausea and vomiting)     PAST SURGICAL HISTORY: Past Surgical History:  Procedure Laterality Date   ABDOMINAL HYSTERECTOMY     ANKLE RECONSTRUCTION Left    1977   APPENDECTOMY     CARPAL TUNNEL RELEASE Right    2010   CATARACT EXTRACTION Left    02/2021   catheter ablation     1993   CESAREAN SECTION     x2   CHOLECYSTECTOMY     HERNIA REPAIR     KNEE ARTHROSCOPY W/ MENISCECTOMY Right    2008   KNEE DISLOCATION SURGERY Right    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     2019   TOTAL HIP ARTHROPLASTY Right 04/12/2021   Procedure: TOTAL HIP ARTHROPLASTY ANTERIOR APPROACH;  Surgeon: Ollen Gross, MD;  Location: [facility];  Service: Orthopedics;  Laterality: Right;   vaginal  fistula of sigmoid colon  FAMILY HISTORY: History reviewed. No pertinent family history.  SOCIAL HISTORY: Social History   Socioeconomic History   Marital status: Divorced    Spouse name: Not on file   Number of children: Not on file   Years of education: Not on file   Highest education level: Not on file  Occupational History   Not on file  Tobacco Use   Smoking status: Never    Passive exposure: Never   Smokeless tobacco: Never  Vaping Use   Vaping Use: Never used  Substance and Sexual Activity   Alcohol use: Never   Drug use: Never   Sexual activity: Not Currently    Birth control/protection: None  Other Topics Concern   Not on file  Social History Narrative   Caffiene 1-2 cups daily.   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    Social Determinants of Health   Financial Resource Strain: Not on file  Food Insecurity: Not on file  Transportation Needs: Not on file  Physical Activity: Not on file  Stress: Not on file  Social Connections: Not on file  Intimate Partner Violence: Not on file   PHYSICAL EXAM  Vitals:   09/20/22 1302  BP: 107/65  Pulse: 73  Weight: 224 lb (101.6 kg)  Height: 5\' 3"  (1.6 m)   Body mass index is 39.68 kg/m.  Generalized: Well developed, in no acute distress  Neurological examination  Mentation: Alert oriented to time, place, history taking. Follows all commands speech and language fluent Cranial nerve II-XII: Pupils were equal round reactive to light. Extraocular movements were full, visual field were full on confrontational test. Facial sensation and strength were normal. Head turning and shoulder shrug  were normal and symmetric. Motor: The motor testing reveals 5 over 5 strength of all 4 extremities. Good symmetric motor tone is noted throughout.  Sensory: Sensory testing is intact to soft touch on all 4 extremities. No evidence of extinction is noted.  Coordination: Cerebellar testing  reveals good finger-nose-finger bilaterally, cannot do heel-to-shin with the right due to knee pain Gait and station: Gait is antalgic limp on the right Reflexes: Deep tendon reflexes are symmetric and normal bilaterally.   DIAGNOSTIC DATA (LABS, IMAGING, TESTING) - I reviewed patient records, labs, notes, testing and imaging myself where available.  Lab Results  Component Value Date   WBC 12.4 (H) 12/24/2021   HGB 12.8 12/24/2021   HCT 40.1 12/24/2021   MCV 92.0 12/24/2021   PLT 182 12/24/2021      Component Value Date/Time   NA 138 12/24/2021 0452   K 3.4 (L) 12/24/2021 0452   CL 105 12/24/2021 0452   CO2 26 12/24/2021 0452   GLUCOSE 159 (H) 12/24/2021 0452   BUN 7 (L) 12/24/2021 0452   CREATININE 0.56 12/24/2021 0452   CALCIUM 8.4 (L) 12/24/2021 0452   PROT 5.8 (L) 12/23/2021 0527   ALBUMIN 2.8 (L) 12/23/2021 0527   AST 16 12/23/2021 0527   ALT 15 12/23/2021 0527   ALKPHOS 54 12/23/2021 0527   BILITOT 0.6 12/23/2021 0527   GFRNONAA >60 12/24/2021 0452   No results found for: "CHOL", "HDL", "LDLCALC", "LDLDIRECT", "TRIG", "CHOLHDL" Lab Results  Component Value Date   HGBA1C 7.3 (H) 12/22/2021   No results found for: "VITAMINB12" No results found for: "TSH"  Margie EgeSarah Zenita Kister, AGNP-C, DNP 09/20/2022, 1:17 PM [facility] [address], [address] [place], [place] [address] [phone]

## 2022-09-20 ENCOUNTER — Ambulatory Visit: Payer: Medicare HMO | Admitting: Neurology

## 2022-09-20 ENCOUNTER — Encounter: Payer: Self-pay | Admitting: Neurology

## 2022-09-20 ENCOUNTER — Telehealth: Payer: Self-pay

## 2022-09-20 DIAGNOSIS — G43711 Chronic migraine without aura, intractable, with status migrainosus: Secondary | ICD-10-CM | POA: Diagnosis not present

## 2022-09-20 MED ORDER — AJOVY 225 MG/1.5ML ~~LOC~~ SOAJ: 225.0000 mg | SUBCUTANEOUS | 11 refills | Status: DC

## 2022-09-20 NOTE — Telephone Encounter (Signed)
Completed forms for ajovy patient assistance program and faxed to [phone] and received confirmation.

## 2022-09-20 NOTE — Patient Instructions (Signed)
Get back on Ajovy, I will send in your patient application

## 2022-09-22 ENCOUNTER — Other Ambulatory Visit: Payer: Self-pay

## 2022-09-22 MED ORDER — CARIPRAZINE HCL 3 MG PO CAPS: 3.0000 mg | ORAL_CAPSULE | Freq: Every day | ORAL | 2 refills | Status: DC

## 2022-09-22 NOTE — Telephone Encounter (Signed)
Prior Authorization initiated for Vraylar 3 mg capsules with [facility] Medicare

## 2022-09-22 NOTE — Telephone Encounter (Signed)
Pt's prior approval is received through [facility] for Vraylar 3 mg through 06/13/2023   Rx for 30 day submitted to [facility].

## 2022-09-22 NOTE — Telephone Encounter (Signed)
Prior Approval received effective through 06/13/2023 with [place], [place] # [id number]

## 2022-09-23 ENCOUNTER — Telehealth: Payer: Self-pay | Admitting: Adult Health

## 2022-09-23 NOTE — Telephone Encounter (Signed)
[employer] approved  VRAYLAR 3mg  cap  from 09/22/22-06/13/23 [phone]

## 2022-09-26 ENCOUNTER — Encounter: Payer: Self-pay | Admitting: Adult Health

## 2022-09-26 ENCOUNTER — Telehealth (INDEPENDENT_AMBULATORY_CARE_PROVIDER_SITE_OTHER): Payer: Medicare HMO | Admitting: Adult Health

## 2022-09-26 DIAGNOSIS — F331 Major depressive disorder, recurrent, moderate: Secondary | ICD-10-CM | POA: Diagnosis not present

## 2022-09-26 DIAGNOSIS — F411 Generalized anxiety disorder: Secondary | ICD-10-CM

## 2022-09-26 DIAGNOSIS — F410 Panic disorder [episodic paroxysmal anxiety] without agoraphobia: Secondary | ICD-10-CM | POA: Diagnosis not present

## 2022-09-26 DIAGNOSIS — G4700 Insomnia, unspecified: Secondary | ICD-10-CM | POA: Diagnosis not present

## 2022-09-26 DIAGNOSIS — F4310 Post-traumatic stress disorder, unspecified: Secondary | ICD-10-CM

## 2022-09-26 NOTE — Progress Notes (Signed)
[name] [id number] 05-25-1956 67 y.o.  Virtual Visit via Video Note  I connected with pt @ on 09/26/22 at  3:40 PM EDT by a video enabled telemedicine application and verified that I am speaking with the correct person using two identifiers.   I discussed the limitations of evaluation and management by telemedicine and the availability of in person appointments. The patient expressed understanding and agreed to proceed.  I discussed the assessment and treatment plan with the patient. The patient was provided an opportunity to ask questions and all were answered. The patient agreed with the plan and demonstrated an understanding of the instructions.   The patient was advised to call back or seek an in-person evaluation if the symptoms worsen or if the condition fails to improve as anticipated.  I provided 25 minutes of non-face-to-face time during this encounter.  The patient was located at home.  The provider was located at [facility].   Melanie Gibbs, NP   Subjective:   Patient ID:  [name] is a 67 y.o. (DOB [birth year]) female.  Chief Complaint: No chief complaint on file.   [name], [name], [name], insomnia, and panic attacks.  Describes mood today as "a little better". Pleasant. Tearful at times. Mood symptoms - reports decreased anxiety - anxiety level is lower than it has been. Reports depression. Reports irritability - "over little things". Reports one recent panic attack since last visit.  Reports decreased worry, rumination, and over thinking - "improved". Mood is lower - "a little bit better". Stating "I'm having more better days". Feels like the Vraylar is helpful. Taking other medications as prescribed. Varying interest and motivation. Taking medications as prescribed. Followed for multiple medical issues.  Energy levels "about the same" Active, does not have a regular exercise routine with physical  disabilities. Enjoys some usual interests and activities. Divorced from husband of 40 years, but lives with ex-husband and his mother - 45 years old. Has a dog - "[name]". Has 3 grown children. Spending time with family. Appetite adequate. Weight loss - 234 to 221 pounds - 63". Sleeping improved. Averages 6 to 8 hours. Focus and concentration difficulties - getting distracted easily. Completing tasks. Managing aspects of household. 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  Denies AH or VH. Denies self harm. Denies substance abuse. Reporting some "paranoia" when in crowded spaces.   Chronic pain - working with pain management.  Seeing Dr. Lucia Gaskins - managing migraines  Previous medication trials: Cymbalta, Prozac  Review of Systems:  Review of Systems  Musculoskeletal:  Negative for gait problem.  Neurological:  Negative for tremors.  Psychiatric/Behavioral:         Please refer to HPI    Medications: I have reviewed the patient's current medications.  Current Outpatient Medications  Medication Sig Dispense Refill   Ascorbic Acid (VITAMIN C) 1000 MG tablet Take 1,000 mg by mouth daily.     buPROPion (WELLBUTRIN SR) 150 MG 12 hr tablet Take 1 tablet (150 mg total) by mouth 2 (two) times daily. 180 tablet 3   busPIRone (BUSPAR) 10 MG tablet Take 1.5 tablets (15 mg total) by mouth 2 (two) times daily. 60 tablet 5   cariprazine (VRAYLAR) 3 MG capsule Take 1 capsule (3 mg total) by mouth daily. 30 capsule 2   Cholecalciferol (VITAMIN D) 125 MCG (5000 UT) CAPS Take 2 capsules by mouth daily.  COLLAGEN PO Take 6,000 mg by mouth daily.     cyanocobalamin 2000 MCG tablet Take 2,000 mcg by mouth daily.     estradiol (ESTRACE) 0.5 MG tablet Take 0.5 mg by mouth daily.     ferrous gluconate (FERGON) 324 MG tablet Take 324 mg by mouth daily with breakfast.     fluconazole (DIFLUCAN) 150 MG tablet Take 150 mg by mouth once a week.     FLUoxetine (PROZAC) 40 MG  capsule Take 1 capsule (40 mg total) by mouth daily. 90 capsule 3   Fremanezumab-vfrm (AJOVY) 225 MG/1.5ML SOAJ Inject 225 mg into the skin every 30 (thirty) days. (Patient not taking: Reported on 09/20/2022) 3 mL 11   Fremanezumab-vfrm (AJOVY) 225 MG/1.5ML SOAJ Inject 225 mg into the skin every 30 (thirty) days. 1.5 mL 11   furosemide (LASIX) 20 MG tablet Take 20 mg by mouth every Monday, Wednesday, and Friday.     gabapentin (NEURONTIN) 600 MG tablet Take 600 mg by mouth in the morning, at noon, in the evening, and at bedtime.     insulin aspart (NOVOLOG) 100 UNIT/ML injection Inject 5-8 Units into the skin 3 (three) times daily before meals.     LANTUS SOLOSTAR 100 UNIT/ML Solostar Pen Inject into the skin.     levocetirizine (XYZAL) 5 MG tablet Take 5 mg by mouth every evening.     levothyroxine (SYNTHROID) 175 MCG tablet Take 175 mcg by mouth daily before breakfast.     LORazepam (ATIVAN) 0.5 MG tablet Take one to 2 tablets daily as needed for panic attacks. 60 tablet 2   lovastatin (MEVACOR) 40 MG tablet Take 40 mg by mouth at bedtime.     meclizine (ANTIVERT) 25 MG tablet Take 25 mg by mouth 3 (three) times daily as needed for dizziness.     metroNIDAZOLE (FLAGYL) 500 MG tablet Take 1 tablet (500 mg total) by mouth every 12 (twelve) hours. 8 tablet 0   montelukast (SINGULAIR) 10 MG tablet Take 10 mg by mouth daily.     ondansetron (ZOFRAN) 4 MG tablet Take 4 mg by mouth 3 (three) times daily as needed for refractory nausea / vomiting.     oxybutynin (DITROPAN) 5 MG tablet Take 5 mg by mouth daily.     oxyCODONE (ROXICODONE) 15 MG immediate release tablet Take 15 mg by mouth.     OZEMPIC, 0.25 OR 0.5 MG/DOSE, 2 MG/3ML SOPN Inject 0.5 mg into the skin once a week.     pantoprazole (PROTONIX) 40 MG tablet Take 40 mg by mouth daily.     rizatriptan (MAXALT) 10 MG tablet Take 10 mg by mouth every 6 (six) hours as needed for migraine. May repeat in 2 hours if needed     tiZANidine (ZANAFLEX) 4 MG  capsule Take 4 mg by mouth in the morning and at bedtime.     traZODone (DESYREL) 100 MG tablet Take 1 tablet (100 mg total) by mouth at bedtime. 90 tablet 3   valACYclovir (VALTREX) 500 MG tablet Take 500 mg by mouth 2 (two) times daily as needed (shingles).     zinc gluconate 50 MG tablet Take 50 mg by mouth daily.     Current Facility-Administered Medications  Medication Dose Route Frequency Provider Last Rate Last Admin   Fremanezumab-vfrm SOSY 225 mg  225 mg Subcutaneous Once Anson Fret, MD        Medication Side Effects: None  Allergies:  Allergies  Allergen Reactions   Hydromorphone Other (See  Comments) and Shortness Of Breath    Respiratory arrest per patient  Other reaction(s): coded with fast admin  Note: Imported from external source.   Penicillins Anaphylaxis and Other (See Comments)   Erythromycin Base Nausea And Vomiting   Ibuprofen Other (See Comments)    Per pt, it makes her bleed   Liraglutide    Meperidine Hives   Meperidine Hcl Other (See Comments)   Nsaids     Other reaction(s): GI bleed Note: Imported from external source.    Past Medical History:  Diagnosis Date   Anxiety    Asthma    Chronic pain    Depression    Diabetes mellitus without complication    GERD (gastroesophageal reflux disease)    Headache    migraines   Hyperlipidemia    Hypertension    Hypothyroidism    Osteoarthritis    PONV (postoperative nausea and vomiting)     No family history on file.  Social History   Socioeconomic History   Marital status: Divorced    Spouse name: Not on file   Number of children: Not on file   Years of education: Not on file   Highest education level: Not on file  Occupational History   Not on file  Tobacco Use   Smoking status: Never    Passive exposure: Never   Smokeless tobacco: Never  Vaping Use   Vaping Use: Never used  Substance and Sexual Activity   Alcohol use: Never   Drug use: Never   Sexual activity: Not Currently     Birth control/protection: None  Other Topics Concern   Not on file  Social History Narrative   Caffiene 1-2 cups daily.   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    Social Determinants of Health   Financial Resource Strain: Not on file  Food Insecurity: Not on file  Transportation Needs: Not on file  Physical Activity: Not on file  Stress: Not on file  Social Connections: Not on file  Intimate Partner Violence: Not on file    Past Medical History, Surgical history, Social history, and Family history were reviewed and updated as appropriate.   Please see review of systems for further details on the patient's review from today.   Objective:   Physical Exam:  There were no vitals taken for this visit.  Physical Exam Constitutional:      General: She is not in acute distress. Musculoskeletal:        General: No deformity.  Neurological:     Mental Status: She is alert and oriented to person, place, and time.     Coordination: Coordination normal.  Psychiatric:        Attention and Perception: Attention and perception normal. She does not perceive auditory or visual hallucinations.        Mood and Affect: Mood normal. Mood is not anxious or depressed. Affect is not labile, blunt, angry or inappropriate.        Speech: Speech normal.        Behavior: Behavior normal.        Thought Content: Thought content normal. Thought content is not paranoid or delusional. Thought content does not include homicidal or suicidal ideation. Thought content does not include homicidal or suicidal plan.        Cognition and Memory: Cognition and memory normal.        Judgment: Judgment normal.     Comments:  Insight intact     Lab Review:     Component Value Date/Time   NA 138 12/24/2021 0452   K 3.4 (L) 12/24/2021 0452   CL 105 12/24/2021 0452   CO2 26 12/24/2021 0452   GLUCOSE 159 (H) 12/24/2021 0452   BUN 7 (L) 12/24/2021 0452   CREATININE 0.56  12/24/2021 0452   CALCIUM 8.4 (L) 12/24/2021 0452   PROT 5.8 (L) 12/23/2021 0527   ALBUMIN 2.8 (L) 12/23/2021 0527   AST 16 12/23/2021 0527   ALT 15 12/23/2021 0527   ALKPHOS 54 12/23/2021 0527   BILITOT 0.6 12/23/2021 0527   GFRNONAA >60 12/24/2021 0452       Component Value Date/Time   WBC 12.4 (H) 12/24/2021 0452   RBC 4.36 12/24/2021 0452   HGB 12.8 12/24/2021 0452   HCT 40.1 12/24/2021 0452   PLT 182 12/24/2021 0452   MCV 92.0 12/24/2021 0452   MCH 29.4 12/24/2021 0452   MCHC 31.9 12/24/2021 0452   RDW 12.9 12/24/2021 0452   LYMPHSABS 1.6 12/22/2021 0800   MONOABS 0.8 12/22/2021 0800   EOSABS 0.0 12/22/2021 0800   BASOSABS 0.1 12/22/2021 0800    No results found for: "POCLITH", "LITHIUM"   No results found for: "PHENYTOIN", "PHENOBARB", "VALPROATE", "CBMZ"   .res Assessment: Plan:    Plan:  PDMP reviewed  Buspar 15mg  BID - PCP Wellbutrin SR 150mg  BID - PCP  Prozac 40mg  daily  Trazadone 100mg  at bedtime Ativan 0.5mg  BID daily prn as needed  Vraylar 3mg  daily - samples given - applied for patient assistance  Time spent with patient was 25 minutes. Greater than 50% of face to face time with patient was spent on counseling and coordination of care.    RTC 6 weeks  Patient advised to contact office with any questions, adverse effects, or acute worsening in signs and symptoms.   Discussed potential benefits, risk, and side effects of benzodiazepines to include potential risk of tolerance and dependence, as well as possible drowsiness. Advised patient not to drive if experiencing drowsiness and to take lowest possible effective dose to minimize risk of dependence and tolerance.   Diagnoses and all orders for this visit:  Major depressive disorder, recurrent episode, moderate  Generalized anxiety disorder  Panic attacks  Insomnia, unspecified type  [name] (post-traumatic stress disorder)      Please see After Visit Summary for patient specific  instructions.  Future Appointments  Date Time Provider Department Center  04/04/2023  1:15 PM Glean Salvo, NP [facility] None    No orders of the defined types were placed in this encounter.     -------------------------------

## 2022-10-04 NOTE — Telephone Encounter (Signed)
Left msg to callback due to pt not sending in income form to [employer]. They need to fax that to [phone].  Routing to pod 2 calls.

## 2022-10-06 ENCOUNTER — Telehealth: Payer: Self-pay | Admitting: Adult Health

## 2022-10-06 NOTE — Telephone Encounter (Signed)
[name] has been approved for pt. Assistance for Vraylar throught 06/13/23.  She will get her refills through pt. Assistance.

## 2022-10-06 NOTE — Telephone Encounter (Signed)
Noted, thank you

## 2022-10-31 ENCOUNTER — Telehealth: Payer: Self-pay

## 2022-10-31 DIAGNOSIS — G43711 Chronic migraine without aura, intractable, with status migrainosus: Secondary | ICD-10-CM

## 2022-10-31 MED ORDER — AJOVY 225 MG/1.5ML ~~LOC~~ SOAJ: 225.0000 mg | SUBCUTANEOUS | 11 refills | Status: DC

## 2022-10-31 NOTE — Telephone Encounter (Signed)
Thank you Katie for calling.  Meds ordered this encounter  Medications   Fremanezumab-vfrm (AJOVY) 225 MG/1.5ML SOAJ    Sig: Inject 225 mg into the skin every 30 (thirty) days.    Dispense:  1.5 mL    Refill:  11

## 2022-10-31 NOTE — Telephone Encounter (Signed)
Patient was denied assistance through [place], pharmacy says they need a valid script on file to check.

## 2022-10-31 NOTE — Addendum Note (Signed)
Addended by: Glean Salvo on: 10/31/2022 04:42 PM   Modules accepted: Orders

## 2022-11-01 ENCOUNTER — Other Ambulatory Visit (HOSPITAL_COMMUNITY): Payer: Self-pay

## 2022-11-01 ENCOUNTER — Telehealth: Payer: Self-pay

## 2022-11-01 MED ORDER — EMGALITY 120 MG/ML ~~LOC~~ SOAJ: 120.0000 mg | SUBCUTANEOUS | 11 refills | Status: DC

## 2022-11-01 NOTE — Telephone Encounter (Signed)
Pharmacy Patient Advocate Encounter   Received notification from [facility] that prior authorization for Emgality 120MG /ML auto-injectors (migraine)  is required/requested.   PA submitted on 11/01/2022 to (ins) [employer] via [employer] or [facility]) confirmation # [id number] Status is pending

## 2022-11-01 NOTE — Telephone Encounter (Signed)
Aimovig and Emgality are on formulary. Switch to [employer] I will not do loading dose since already taking Ajovy, Thanks  Meds ordered this encounter  Medications   DISCONTD: Fremanezumab-vfrm (AJOVY) 225 MG/1.5ML SOAJ    Sig: Inject 225 mg into the skin every 30 (thirty) days.    Dispense:  1.5 mL    Refill:  11   Galcanezumab-gnlm (EMGALITY) 120 MG/ML SOAJ    Sig: Inject 120 mg into the skin every 30 (thirty) days.    Dispense:  1.12 mL    Refill:  11

## 2022-11-01 NOTE — Telephone Encounter (Signed)
PA needed, sent to team in new phone note 05.21.24

## 2022-11-01 NOTE — Telephone Encounter (Signed)
PA has been approved, approval letter has been attached in patients documents

## 2022-11-01 NOTE — Telephone Encounter (Signed)
Pa needed for emgality. She is on samples of ajovy so no need for loading dose.  Thanks

## 2022-11-01 NOTE — H&P (Addendum)
TOTAL KNEE ADMISSION H&P  Patient is being admitted for right total knee arthroplasty.  Subjective:  Chief Complaint: Right knee pain.  HPI: [name], 67 y.o. female has a history of pain and functional disability in the right knee due to arthritis and has failed non-surgical conservative treatments for greater than 12 weeks to include NSAID's and/or analgesics, flexibility and strengthening excercises, and activity modification. Onset of symptoms was gradual, starting several years ago with gradually worsening course since that time. The patient noted no past surgery on the right knee.  Patient currently rates pain in the right knee at 7 out of 10 with activity. Patient has night pain, worsening of pain with activity and weight bearing, and pain that interferes with activities of daily living. Patient has evidence of  bone-on-bone arthritis in the medial and patellofemoral compartments. She has a screw from an old tibial tubercle osteotomy on the right side  by imaging studies. There is no active infection.  Patient Active Problem List   Diagnosis Date Noted   GI bleed 12/23/2021   Rectal bleed 12/22/2021   Hypokalemia 12/22/2021   Hypocalcemia 12/22/2021   Colitis 12/22/2021   HTN (hypertension) 12/22/2021   HLD (hyperlipidemia) 12/22/2021   DM2 (diabetes mellitus, type 2) (HCC) 12/22/2021   Iron deficiency anemia 12/22/2021   Depression 12/22/2021   OA (osteoarthritis) of hip 04/12/2021   Primary osteoarthritis of right hip 04/12/2021    Past Medical History:  Diagnosis Date   Anxiety    Asthma    Chronic pain    Depression    Diabetes mellitus without complication (HCC)    GERD (gastroesophageal reflux disease)    Headache    migraines   Hyperlipidemia    Hypertension    Hypothyroidism    Osteoarthritis    PONV (postoperative nausea and vomiting)     Past Surgical History:  Procedure Laterality Date   ABDOMINAL HYSTERECTOMY     ANKLE RECONSTRUCTION Left     1977   APPENDECTOMY     CARPAL TUNNEL RELEASE Right    2010   CATARACT EXTRACTION Left    02/2021   catheter ablation     1993   CESAREAN SECTION     x2   CHOLECYSTECTOMY     HERNIA REPAIR     KNEE ARTHROSCOPY W/ MENISCECTOMY Right    2008   KNEE DISLOCATION SURGERY Right    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     2019   TOTAL HIP ARTHROPLASTY Right 04/12/2021   Procedure: TOTAL HIP ARTHROPLASTY ANTERIOR APPROACH;  Surgeon: Ollen Gross, MD;  Location: [facility];  Service: Orthopedics;  Laterality: Right;   vaginal fistula of sigmoid colon      Prior to Admission medications   Medication Sig Start Date End Date Taking? Authorizing Provider  Ascorbic Acid (VITAMIN C) 1000 MG tablet Take 1,000 mg by mouth daily.    [provider]  buPROPion (WELLBUTRIN SR) 150 MG 12 hr tablet Take 1 tablet (150 mg total) by mouth 2 (two) times daily. 03/09/22   Mozingo, Thereasa Solo, NP  busPIRone (BUSPAR) 10 MG tablet Take 1.5 tablets (15 mg total) by mouth 2 (two) times daily. 05/20/22   Mozingo, Thereasa Solo, NP  cariprazine (VRAYLAR) 3 MG capsule Take 1 capsule (3 mg total) by mouth daily. 09/22/22   Mozingo, Thereasa Solo, NP  Cholecalciferol (VITAMIN D) 125 MCG (5000 UT) CAPS Take 2 capsules by mouth daily.    [provider]  COLLAGEN PO Take  6,000 mg by mouth daily.    [provider]  cyanocobalamin 2000 MCG tablet Take 2,000 mcg by mouth daily.    [provider]  estradiol (ESTRACE) 0.5 MG tablet Take 0.5 mg by mouth daily.    [provider]  ferrous gluconate (FERGON) 324 MG tablet Take 324 mg by mouth daily with breakfast.    [provider]  fluconazole (DIFLUCAN) 150 MG tablet Take 150 mg by mouth once a week. 10/17/21   [provider]  FLUoxetine (PROZAC) 40 MG capsule Take 1 capsule (40 mg total) by mouth daily. 06/23/22   Mozingo, Thereasa Solo, NP  furosemide (LASIX) 20 MG tablet Take 20 mg by mouth every Monday, Wednesday, and  Friday.    [provider]  gabapentin (NEURONTIN) 600 MG tablet Take 600 mg by mouth in the morning, at noon, in the evening, and at bedtime.    [provider]  Galcanezumab-gnlm (EMGALITY) 120 MG/ML SOAJ Inject 120 mg into the skin every 30 (thirty) days. 11/01/22   Glean Salvo, NP  insulin aspart (NOVOLOG) 100 UNIT/ML injection Inject 5-8 Units into the skin 3 (three) times daily before meals.    [provider]  LANTUS SOLOSTAR 100 UNIT/ML Solostar Pen Inject into the skin.    [provider]  levocetirizine (XYZAL) 5 MG tablet Take 5 mg by mouth every evening.    [provider]  levothyroxine (SYNTHROID) 175 MCG tablet Take 175 mcg by mouth daily before breakfast.    [provider]  LORazepam (ATIVAN) 0.5 MG tablet Take one to 2 tablets daily as needed for panic attacks. 05/20/22   Mozingo, Thereasa Solo, NP  lovastatin (MEVACOR) 40 MG tablet Take 40 mg by mouth at bedtime.    [provider]  meclizine (ANTIVERT) 25 MG tablet Take 25 mg by mouth 3 (three) times daily as needed for dizziness.    [provider]  metroNIDAZOLE (FLAGYL) 500 MG tablet Take 1 tablet (500 mg total) by mouth every 12 (twelve) hours. 12/24/21   Catarina Hartshorn, MD  montelukast (SINGULAIR) 10 MG tablet Take 10 mg by mouth daily.    [provider]  ondansetron (ZOFRAN) 4 MG tablet Take 4 mg by mouth 3 (three) times daily as needed for refractory nausea / vomiting.    [provider]  oxybutynin (DITROPAN) 5 MG tablet Take 5 mg by mouth daily. 12/15/21   [provider]  oxyCODONE (ROXICODONE) 15 MG immediate release tablet Take 15 mg by mouth.    [provider]  OZEMPIC, 0.25 OR 0.5 MG/DOSE, 2 MG/3ML SOPN Inject 0.5 mg into the skin once a week. 08/11/22   [provider]  pantoprazole (PROTONIX) 40 MG tablet Take 40 mg by mouth daily. 10/13/21   [provider]  rizatriptan (MAXALT) 10 MG tablet  Take 10 mg by mouth every 6 (six) hours as needed for migraine. May repeat in 2 hours if needed    [provider]  tiZANidine (ZANAFLEX) 4 MG capsule Take 4 mg by mouth in the morning and at bedtime.    [provider]  traZODone (DESYREL) 100 MG tablet Take 1 tablet (100 mg total) by mouth at bedtime. 06/23/22   Mozingo, Thereasa Solo, NP  valACYclovir (VALTREX) 500 MG tablet Take 500 mg by mouth 2 (two) times daily as needed (shingles).    [provider]  zinc gluconate 50 MG tablet Take 50 mg by mouth daily.    [provider]    Allergies  Allergen Reactions   Hydromorphone Other (See Comments) and Shortness Of Breath    Respiratory arrest per patient  Other reaction(s): coded with fast admin  Note: Imported from external source.   Penicillins Anaphylaxis and Other (See Comments)   Erythromycin Base Nausea And Vomiting   Ibuprofen Other (See Comments)    Per pt, it makes her bleed   Liraglutide    Meperidine Hives   Meperidine Hcl Other (See Comments)   Nsaids     Other reaction(s): GI bleed Note: Imported from external source.    Social History   Socioeconomic History   Marital status: Divorced    Spouse name: Not on file   Number of children: Not on file   Years of education: Not on file   Highest education level: Not on file  Occupational History   Not on file  Tobacco Use   Smoking status: Never    Passive exposure: Never   Smokeless tobacco: Never  Vaping Use   Vaping Use: Never used  Substance and Sexual Activity   Alcohol use: Never   Drug use: Never   Sexual activity: Not Currently    Birth control/protection: None  Other Topics Concern   Not on file  Social History Narrative   Caffiene 1-2 cups daily.   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    Social Determinants of Health   Financial Resource Strain: Not on file  Food Insecurity: Not on file  Transportation Needs: Not on  file  Physical Activity: Not on file  Stress: Not on file  Social Connections: Not on file  Intimate Partner Violence: Not on file    Tobacco Use: Low Risk  (09/26/2022)   Patient History    Smoking Tobacco Use: Never    Smokeless Tobacco Use: Never    Passive Exposure: Never   Social History   Substance and Sexual Activity  Alcohol Use Never    No family history on file.  Review of Systems  Constitutional:  Negative for chills and fever.  HENT: Negative.    Eyes: Negative.   Respiratory:  Negative for cough and shortness of breath.   Cardiovascular:  Negative for chest pain and palpitations.  Gastrointestinal:  Negative for abdominal pain, constipation, diarrhea, nausea and vomiting.  Genitourinary:  Negative for dysuria, frequency and urgency.  Musculoskeletal:  Positive for joint pain.  Skin:  Negative for rash.   Objective:  Physical Exam: Well nourished and well developed.  General: Alert and oriented x3, cooperative and pleasant, no acute distress.  Head: normocephalic, atraumatic, neck supple.  Eyes: EOMI. Abdomen: non-tender to palpation and soft, normoactive bowel sounds. Musculoskeletal: The patient has an antalgic gait pattern favoring the right side.  Right Knee Exam: Large effusion present. No swelling present. The range of motion is: 10 to 100 degrees. Positive crepitus on range of motion of the knee. Positive medial joint line tenderness. Positive lateral joint line tenderness. The knee is stable.  Left Knee Exam: No effusion present. No swelling present. The Range of motion is: 0 to 125 degrees. No crepitus on range of motion of the knee. No medial joint line tenderness. No lateral joint line tenderness. The knee is stable.  Calves soft and nontender. Motor function intact in LE. Strength 5/5 LE bilaterally. Neuro: Distal pulses 2+. Sensation to light touch intact in LE.  Vital signs in last 24 hours: BP: ()/()  Arterial Line BP: ()/()    Imaging Review Plain radiographs demonstrate severe degenerative joint disease of the right knee. The overall alignment is neutral. The bone quality appears to be adequate for age and reported activity level.  Assessment/Plan:  End stage arthritis, right knee   The patient history, physical examination, clinical judgment of the provider and imaging studies are consistent with end stage degenerative joint disease of the right knee and total knee arthroplasty is deemed medically necessary. The treatment options including medical management, injection therapy arthroscopy and arthroplasty were discussed at length. The risks and benefits of total knee arthroplasty were presented and reviewed. The risks due to aseptic loosening, infection, stiffness, patella tracking problems, thromboembolic complications and other imponderables were discussed. The patient acknowledged the explanation, agreed to proceed with the plan and consent was signed. Patient is being admitted for inpatient treatment for surgery, pain control, PT, OT, prophylactic antibiotics, VTE prophylaxis, progressive ambulation and ADLs and discharge planning. The patient is planning to be discharged  home .   Patient's anticipated LOS is less than 2 midnights, meeting these requirements: - Younger than 72 - Lives within 1 hour of care - Has a competent adult at home to recover with post-op - NO history of  - Coronary Artery Disease  - Heart failure  - Heart attack  - Stroke  - DVT/VTE  - Respiratory Failure/COPD  - Renal failure  - Anemia  - Advanced Liver disease  Therapy Plans: EO [place] Disposition: Home with Husband Planned DVT Prophylaxis: Aspirin 81 mg BID DME Needed: None PCP: Wallace Cullens, FNP (appt beginning of June) TXA: IV Allergies: erythromycin, hydromorphone, meperidine, PCN (hives and hard to breathe), victoza Anesthesia Concerns: N/V - Has had Phenergan and Zofran in the past - both effective BMI:  38.2 Last HgbA1c: 6.2%  Pharmacy: [facility])  Other: - Given clearance form to take to PCP appt in beginning of June - Has Wolf-Parkinson-White Syndrome - Oxycodone 15 mg q 4 hrs  - Patient was instructed on what medications to stop prior to surgery. - Follow-up visit in 2 weeks with Dr. Lequita Halt - Begin physical therapy following surgery - Pre-operative lab work as pre-surgical testing - Prescriptions will be provided in hospital at time of discharge  R. Arcola Jansky, PA-C Orthopedic Surgery [facility] [place]

## 2022-11-01 NOTE — Addendum Note (Signed)
Addended by: Glean Salvo on: 11/01/2022 10:43 AM   Modules accepted: Orders

## 2022-11-09 ENCOUNTER — Encounter (HOSPITAL_COMMUNITY): Payer: Self-pay
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
## 2022-11-15 NOTE — Patient Instructions (Addendum)
SURGICAL WAITING ROOM VISITATION  Patients having surgery or a procedure may have no more than 2 support people in the waiting area - these visitors may rotate.    Children under the age of 3 must have an adult with them who is not the patient.  Due to an increase in RSV and influenza rates and associated hospitalizations, children ages 9 and under may not visit patients in [facility] hospitals.  If the patient needs to stay at the hospital during part of their recovery, the visitor guidelines for inpatient rooms apply. Pre-op nurse will coordinate an appropriate time for 1 support person to accompany patient in pre-op.  This support person may not rotate.    Please refer to the [facility] website for the visitor guidelines for Inpatients (after your surgery is over and you are in a regular room).       Your procedure is scheduled on:  11/28/2022    Report to [facility] Main Entrance    Report to admitting at   [phone]   Call this number if you have problems the morning of surgery [phone]   Do not eat food :After Midnight.   After Midnight you may have the following liquids until _ 0415_____ AM DAY OF SURGERY  Water Non-Citrus Juices (without pulp, NO RED-Apple, White grape, White cranberry) Black Coffee (NO MILK/CREAM OR CREAMERS, sugar ok)  Clear Tea (NO MILK/CREAM OR CREAMERS, sugar ok) regular and decaf                             Plain Jell-O (NO RED)                                           Fruit ices (not with fruit pulp, NO RED)                                     Popsicles (NO RED)                                                               Sports drinks like Gatorade (NO RED)                      The day of surgery:  Drink ONE (1) Pre-Surgery Clear Ensure or G2 at    0415AM ( have completed by )  the morning of surgery. Drink in one sitting. Do not sip.  This drink was given to you during your hospital  pre-op appointment visit. Nothing else to  drink after completing the  Pre-Surgery Clear Ensure or G2.          If you have questions, please contact your surgeon's office.        Oral Hygiene is also important to reduce your risk of infection.                                    Remember - BRUSH YOUR TEETH THE  MORNING OF SURGERY WITH YOUR REGULAR TOOTHPASTE  DENTURES WILL BE REMOVED PRIOR TO SURGERY PLEASE DO NOT APPLY "Poly grip" OR ADHESIVES!!!   Do NOT smoke after Midnight   Take these medicines the morning of surgery with A SIP OF WATER:   Wellbutrin buspar, prozac, gabapentin, synthroid, protonix,              Ozempic-            Novolog-            Lantus-   DO NOT TAKE ANY ORAL DIABETIC MEDICATIONS DAY OF YOUR SURGERY  Bring CPAP mask and tubing day of surgery.                              You may not have any metal on your body including hair pins, jewelry, and body piercing             Do not wear make-up, lotions, powders, perfumes/cologne, or deodorant  Do not wear nail polish including gel and S&S, artificial/acrylic nails, or any other type of covering on natural nails including finger and toenails. If you have artificial nails, gel coating, etc. that needs to be removed by a nail salon please have this removed prior to surgery or surgery may need to be canceled/ delayed if the surgeon/ anesthesia feels like they are unable to be safely monitored.   Do not shave  48 hours prior to surgery.               Men may shave face and neck.   Do not bring valuables to the hospital. [facility] IS NOT             RESPONSIBLE   FOR VALUABLES.   Contacts, glasses, dentures or bridgework may not be worn into surgery.   Bring small overnight bag day of surgery.   DO NOT BRING YOUR HOME MEDICATIONS TO THE HOSPITAL. PHARMACY WILL DISPENSE MEDICATIONS LISTED ON YOUR MEDICATION LIST TO YOU DURING YOUR ADMISSION IN THE HOSPITAL!    Patients discharged on the day of surgery will not be allowed to drive home.  Someone  NEEDS to stay with you for the first 24 hours after anesthesia.   Special Instructions: Bring a copy of your healthcare power of attorney and living will documents the day of surgery if you haven't scanned them before.              Please read over the following fact sheets you were given: IF YOU HAVE QUESTIONS ABOUT YOUR PRE-OP INSTRUCTIONS PLEASE CALL [phone]   If you received a COVID test during your pre-op visit  it is requested that you wear a mask when out in public, stay away from anyone that may not be feeling well and notify your surgeon if you develop symptoms. If you test positive for Covid or have been in contact with anyone that has tested positive in the last 10 days please notify you surgeon.      Pre-operative 5 CHG Bath Instructions   You can play a key role in reducing the risk of infection after surgery. Your skin needs to be as free of germs as possible. You can reduce the number of germs on your skin by washing with CHG (chlorhexidine gluconate) soap before surgery. CHG is an antiseptic soap that kills germs and continues to kill germs even after washing.   DO NOT use if you  have an allergy to chlorhexidine/CHG or antibacterial soaps. If your skin becomes reddened or irritated, stop using the CHG and notify one of our RNs at [phone].   Please shower with the CHG soap starting 4 days before surgery using the following schedule:     Please keep in mind the following:  DO NOT shave, including legs and underarms, starting the day of your first shower.   You may shave your face at any point before/day of surgery.  Place clean sheets on your bed the day you start using CHG soap. Use a clean washcloth (not used since being washed) for each shower. DO NOT sleep with pets once you start using the CHG.   CHG Shower Instructions:  If you choose to wash your hair and private area, wash first with your normal shampoo/soap.  After you use shampoo/soap, rinse your hair  and body thoroughly to remove shampoo/soap residue.  Turn the water OFF and apply about 3 tablespoons (45 ml) of CHG soap to a CLEAN washcloth.  Apply CHG soap ONLY FROM YOUR NECK DOWN TO YOUR TOES (washing for 3-5 minutes)  DO NOT use CHG soap on face, private areas, open wounds, or sores.  Pay special attention to the area where your surgery is being performed.  If you are having back surgery, having someone wash your back for you may be helpful. Wait 2 minutes after CHG soap is applied, then you may rinse off the CHG soap.  Pat dry with a clean towel  Put on clean clothes/pajamas   If you choose to wear lotion, please use ONLY the CHG-compatible lotions on the back of this paper.     Additional instructions for the day of surgery: DO NOT APPLY any lotions, deodorants, cologne, or perfumes.   Put on clean/comfortable clothes.  Brush your teeth.  Ask your nurse before applying any prescription medications to the skin.      CHG Compatible Lotions   Aveeno Moisturizing lotion  Cetaphil Moisturizing Cream  Cetaphil Moisturizing Lotion  Clairol Herbal Essence Moisturizing Lotion, Dry Skin  Clairol Herbal Essence Moisturizing Lotion, Extra Dry Skin  Clairol Herbal Essence Moisturizing Lotion, Normal Skin  Curel Age Defying Therapeutic Moisturizing Lotion with Alpha Hydroxy  Curel Extreme Care Body Lotion  Curel Soothing Hands Moisturizing Hand Lotion  Curel Therapeutic Moisturizing Cream, Fragrance-Free  Curel Therapeutic Moisturizing Lotion, Fragrance-Free  Curel Therapeutic Moisturizing Lotion, Original Formula  Eucerin Daily Replenishing Lotion  Eucerin Dry Skin Therapy Plus Alpha Hydroxy Crme  Eucerin Dry Skin Therapy Plus Alpha Hydroxy Lotion  Eucerin Original Crme  Eucerin Original Lotion  Eucerin Plus Crme Eucerin Plus Lotion  Eucerin TriLipid Replenishing Lotion  Keri Anti-Bacterial Hand Lotion  Keri Deep Conditioning Original Lotion Dry Skin Formula Softly Scented   Keri Deep Conditioning Original Lotion, Fragrance Free Sensitive Skin Formula  Keri Lotion Fast Absorbing Fragrance Free Sensitive Skin Formula  Keri Lotion Fast Absorbing Softly Scented Dry Skin Formula  Keri Original Lotion  Keri Skin Renewal Lotion Keri Silky Smooth Lotion  Keri Silky Smooth Sensitive Skin Lotion  Nivea Body Creamy Conditioning Oil  Nivea Body Extra Enriched Teacher, adult education Moisturizing Lotion Nivea Crme  Nivea Skin Firming Lotion  NutraDerm 30 Skin Lotion  NutraDerm Skin Lotion  NutraDerm Therapeutic Skin Cream  NutraDerm Therapeutic Skin Lotion  ProShield Protective Hand Cream  Provon moisturizing lotion

## 2022-11-16 ENCOUNTER — Other Ambulatory Visit (HOSPITAL_COMMUNITY): Payer: Self-pay | Admitting: Nurse Practitioner

## 2022-11-16 DIAGNOSIS — Z1231 Encounter for screening mammogram for malignant neoplasm of breast: Secondary | ICD-10-CM

## 2022-11-17 ENCOUNTER — Encounter (HOSPITAL_COMMUNITY)
Admission: RE | Admit: 2022-11-17 | Discharge: 2022-11-17 | Disposition: A | Discharge status: Home / Self Care | Payer: Medicare HMO | Source: Ambulatory Visit | Attending: Orthopedic Surgery | Admitting: Orthopedic Surgery

## 2022-11-17 ENCOUNTER — Encounter (HOSPITAL_COMMUNITY): Payer: Self-pay

## 2022-11-17 ENCOUNTER — Other Ambulatory Visit: Payer: Self-pay

## 2022-11-17 VITALS — BP 108/71 | HR 71 | Temp 98.9°F | Resp 16 | Ht 63.0 in

## 2022-11-17 DIAGNOSIS — Z01812 Encounter for preprocedural laboratory examination: Secondary | ICD-10-CM | POA: Insufficient documentation

## 2022-11-17 DIAGNOSIS — Z01818 Encounter for other preprocedural examination: Secondary | ICD-10-CM

## 2022-11-17 HISTORY — DX: Fibromyalgia: M79.7

## 2022-11-17 LAB — CBC
HCT: 43.5 % (ref 36.0–46.0)
Hemoglobin: 13.9 g/dL (ref 12.0–15.0)
MCH: 29.8 pg (ref 26.0–34.0)
MCHC: 32 g/dL (ref 30.0–36.0)
MCV: 93.1 fL (ref 80.0–100.0)
Platelets: 180 10*3/uL (ref 150–400)
RBC: 4.67 MIL/uL (ref 3.87–5.11)
RDW: 13 % (ref 11.5–15.5)
WBC: 9.1 10*3/uL (ref 4.0–10.5)
nRBC: 0 % (ref 0.0–0.2)

## 2022-11-17 LAB — BASIC METABOLIC PANEL
Anion gap: 7 (ref 5–15)
BUN: 12 mg/dL (ref 8–23)
CO2: 32 mmol/L (ref 22–32)
Calcium: 9.7 mg/dL (ref 8.9–10.3)
Chloride: 101 mmol/L (ref 98–111)
Creatinine, Ser: 0.86 mg/dL (ref 0.44–1.00)
GFR, Estimated: >60 mL/min (ref >60)
Glucose, Bld: 98 mg/dL (ref 70–99)
Potassium: 4.4 mmol/L (ref 3.5–5.1)
Sodium: 140 mmol/L (ref 135–145)

## 2022-11-17 LAB — HEMOGLOBIN A1C
Hgb A1c MFr Bld: 6.2 % — ABNORMAL HIGH (ref 4.8–5.6)
Mean Plasma Glucose: 131.24 mg/dL

## 2022-11-17 LAB — SURGICAL PCR SCREEN
MRSA, PCR: NEGATIVE
Staphylococcus aureus: NEGATIVE

## 2022-11-17 LAB — GLUCOSE, CAPILLARY: Glucose-Capillary: 84 mg/dL (ref 70–99)

## 2022-11-22 ENCOUNTER — Encounter: Payer: Self-pay | Admitting: Adult Health

## 2022-11-22 ENCOUNTER — Telehealth (INDEPENDENT_AMBULATORY_CARE_PROVIDER_SITE_OTHER): Payer: Medicare HMO | Admitting: Adult Health

## 2022-11-22 DIAGNOSIS — F410 Panic disorder [episodic paroxysmal anxiety] without agoraphobia: Secondary | ICD-10-CM | POA: Diagnosis not present

## 2022-11-22 DIAGNOSIS — F4310 Post-traumatic stress disorder, unspecified: Secondary | ICD-10-CM

## 2022-11-22 DIAGNOSIS — F411 Generalized anxiety disorder: Secondary | ICD-10-CM | POA: Diagnosis not present

## 2022-11-22 DIAGNOSIS — G4700 Insomnia, unspecified: Secondary | ICD-10-CM | POA: Diagnosis not present

## 2022-11-22 DIAGNOSIS — F331 Major depressive disorder, recurrent, moderate: Secondary | ICD-10-CM

## 2022-11-22 NOTE — Progress Notes (Signed)
[name] [id number] [birth year] 67 y.o.  Virtual Visit via Video Note  I connected with pt @ on 11/22/22 at  3:40 PM EDT by a video enabled telemedicine application and verified that I am speaking with the correct person using two identifiers.   I discussed the limitations of evaluation and management by telemedicine and the availability of in person appointments. The patient expressed understanding and agreed to proceed.  I discussed the assessment and treatment plan with the patient. The patient was provided an opportunity to ask questions and all were answered. The patient agreed with the plan and demonstrated an understanding of the instructions.   The patient was advised to call back or seek an in-person evaluation if the symptoms worsen or if the condition fails to improve as anticipated.  I provided 25 minutes of non-face-to-face time during this encounter.  The patient was located at home.  The provider was located at [facility].   Dorothyann Gibbs, NP   Subjective:   Patient ID:  [name] is a 67 y.o. (DOB [birth year]) female.  Chief Complaint: No chief complaint on file.   HPI [name] presents to the office today for follow-up of MDD, [name], [name], insomnia, and panic attacks.  Describes mood today as "about the same". Pleasant. Tearful at times. Mood symptoms - reports  anxiety at times - has stopped taking ativan. Reports issues with social interaction.Reports depression - "a little better with the Vraylar". Reports decreased irritability. Denies recent panic attacks. Reports decreased worry, rumination, and over thinking. Getting frustrated with things she has to do around the house. Upcoming knee surgery. Mood has improved. Stating "I feel better". Feels like current medication regimen is helpful. Varying interest and motivation. Taking medications as prescribed. Followed for multiple medical issues. Energy levels "about the same" Active, does not  have a regular exercise routine with physical disabilities. Enjoys some usual interests and activities. Divorced from husband of 40 years, but lives with ex-husband and his mother - 96 years old. Has a dog - "[name]". Has 3 grown children. Spending time with family. Appetite adequate. Weight loss - 234 to 216 pounds - 63". A1C 6.2 - started on Ozempic 3 months ago. Sleeping improved. Averages 6 to 8 hours. Focus and concentration difficulties at times - gets distracted easily. Completing tasks. Managing aspects of household. 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  Denies AH or VH. Denies self harm. Denies substance abuse. Reporting some "paranoia" when in crowded spaces.   Chronic pain - working with pain management.  Seeing Dr. Lucia Gaskins - managing migraines  Previous medication trials: Cymbalta, Prozac    Flowsheet Row ED to Hosp-Admission (Discharged) from 12/22/2021 in [facility] ED from 09/23/2021 in [facility] Admission (Discharged) from 04/12/2021 in [facility]  C-SSRS RISK CATEGORY No Risk No Risk No Risk        Review of Systems:  Review of Systems  Musculoskeletal:  Negative for gait problem.  Neurological:  Negative for tremors.  Psychiatric/Behavioral:         Please refer to HPI    Medications: I have reviewed the patient's current medications.  Current Outpatient Medications  Medication Sig Dispense Refill   Ascorbic Acid (VITAMIN C) 1000 MG tablet Take 1,000 mg by mouth daily.     buPROPion (WELLBUTRIN SR) 150 MG 12 hr tablet Take 1 tablet (150 mg total) by mouth  2 (two) times daily. 180 tablet 3   busPIRone (BUSPAR) 10 MG tablet Take 1.5 tablets (15 mg total) by mouth 2 (two) times daily. 60 tablet 5   cariprazine (VRAYLAR) 3 MG capsule Take 1 capsule (3 mg total) by mouth daily. 30 capsule 2   Cholecalciferol (VITAMIN D) 125 MCG (5000 UT) CAPS Take 2 capsules by mouth  daily.     COLLAGEN PO Take 6,000 mg by mouth daily.     cyanocobalamin 2000 MCG tablet Take 2,000 mcg by mouth daily.     estradiol (ESTRACE) 0.5 MG tablet Take 0.5 mg by mouth daily.     ferrous gluconate (FERGON) 324 MG tablet Take 324 mg by mouth daily with breakfast.     fluconazole (DIFLUCAN) 150 MG tablet Take 150 mg by mouth once a week.     FLUoxetine (PROZAC) 40 MG capsule Take 1 capsule (40 mg total) by mouth daily. 90 capsule 3   furosemide (LASIX) 20 MG tablet Take 20 mg by mouth every Monday, Wednesday, and Friday.     gabapentin (NEURONTIN) 600 MG tablet Take 600 mg by mouth in the morning, at noon, in the evening, and at bedtime.     Galcanezumab-gnlm (EMGALITY) 120 MG/ML SOAJ Inject 120 mg into the skin every 30 (thirty) days. 1.12 mL 11   insulin aspart (NOVOLOG) 100 UNIT/ML injection Inject 5-8 Units into the skin 3 (three) times daily before meals.     LANTUS SOLOSTAR 100 UNIT/ML Solostar Pen Inject 44 Units into the skin daily. At hs     levocetirizine (XYZAL) 5 MG tablet Take 5 mg by mouth every evening.     levothyroxine (SYNTHROID) 175 MCG tablet Take 175 mcg by mouth daily before breakfast.     LORazepam (ATIVAN) 0.5 MG tablet Take one to 2 tablets daily as needed for panic attacks. 60 tablet 2   lovastatin (MEVACOR) 40 MG tablet Take 40 mg by mouth at bedtime.     meclizine (ANTIVERT) 25 MG tablet Take 25 mg by mouth 3 (three) times daily as needed for dizziness.     metroNIDAZOLE (FLAGYL) 500 MG tablet Take 1 tablet (500 mg total) by mouth every 12 (twelve) hours. 8 tablet 0   montelukast (SINGULAIR) 10 MG tablet Take 10 mg by mouth daily.     ondansetron (ZOFRAN) 4 MG tablet Take 4 mg by mouth 3 (three) times daily as needed for refractory nausea / vomiting.     oxybutynin (DITROPAN) 5 MG tablet Take 5 mg by mouth daily.     oxyCODONE (ROXICODONE) 15 MG immediate release tablet Take 15 mg by mouth.     OZEMPIC, 0.25 OR 0.5 MG/DOSE, 2 MG/3ML SOPN Inject 0.5 mg into  the skin once a week. Fridays     pantoprazole (PROTONIX) 40 MG tablet Take 40 mg by mouth daily.     rizatriptan (MAXALT) 10 MG tablet Take 10 mg by mouth every 6 (six) hours as needed for migraine. May repeat in 2 hours if needed     tiZANidine (ZANAFLEX) 4 MG capsule Take 4 mg by mouth in the morning and at bedtime.     traZODone (DESYREL) 100 MG tablet Take 1 tablet (100 mg total) by mouth at bedtime. 90 tablet 3   valACYclovir (VALTREX) 500 MG tablet Take 500 mg by mouth 2 (two) times daily as needed (shingles).     zinc gluconate 50 MG tablet Take 50 mg by mouth daily.     Current Facility-Administered Medications  Medication Dose Route Frequency Provider Last Rate Last Admin   Fremanezumab-vfrm SOSY 225 mg  225 mg Subcutaneous Once Anson Fret, MD        Medication Side Effects: None  Allergies:  Allergies  Allergen Reactions   Hydromorphone Other (See Comments) and Shortness Of Breath    Respiratory arrest per patient  Other reaction(s): coded with fast admin  Note: Imported from external source.   Penicillins Anaphylaxis and Other (See Comments)   Erythromycin Base Nausea And Vomiting   Ibuprofen Other (See Comments)    Per pt, it makes her bleed   Liraglutide    Meperidine Hives   Meperidine Hcl Other (See Comments)   Nsaids     Other reaction(s): GI bleed Note: Imported from external source.    Past Medical History:  Diagnosis Date   Anxiety    Asthma    Chronic pain    Depression    Diabetes mellitus without complication (HCC)    Fibromyalgia    GERD (gastroesophageal reflux disease)    Headache    migraines   Hyperlipidemia    Hypothyroidism    Osteoarthritis    PONV (postoperative nausea and vomiting)     Past Medical History, Surgical history, Social history, and Family history were reviewed and updated as appropriate.   Please see review of systems for further details on the patient's review from today.   Objective:   Physical Exam:   There were no vitals taken for this visit.  Physical Exam Constitutional:      General: She is not in acute distress. Musculoskeletal:        General: No deformity.  Neurological:     Mental Status: She is alert and oriented to person, place, and time.     Coordination: Coordination normal.  Psychiatric:        Attention and Perception: Attention and perception normal. She does not perceive auditory or visual hallucinations.        Mood and Affect: Mood normal. Mood is not anxious or depressed. Affect is not labile, blunt, angry or inappropriate.        Speech: Speech normal.        Behavior: Behavior normal.        Thought Content: Thought content normal. Thought content is not paranoid or delusional. Thought content does not include homicidal or suicidal ideation. Thought content does not include homicidal or suicidal plan.        Cognition and Memory: Cognition and memory normal.        Judgment: Judgment normal.     Comments: Insight intact     Lab Review:     Component Value Date/Time   NA 140 11/17/2022 1330   K 4.4 11/17/2022 1330   CL 101 11/17/2022 1330   CO2 32 11/17/2022 1330   GLUCOSE 98 11/17/2022 1330   BUN 12 11/17/2022 1330   CREATININE 0.86 11/17/2022 1330   CALCIUM 9.7 11/17/2022 1330   PROT 5.8 (L) 12/23/2021 0527   ALBUMIN 2.8 (L) 12/23/2021 0527   AST 16 12/23/2021 0527   ALT 15 12/23/2021 0527   ALKPHOS 54 12/23/2021 0527   BILITOT 0.6 12/23/2021 0527   GFRNONAA >60 11/17/2022 1330       Component Value Date/Time   WBC 9.1 11/17/2022 1330   RBC 4.67 11/17/2022 1330   HGB 13.9 11/17/2022 1330   HCT 43.5 11/17/2022 1330   PLT 180 11/17/2022 1330   MCV 93.1 11/17/2022 1330   MCH  29.8 11/17/2022 1330   MCHC 32.0 11/17/2022 1330   RDW 13.0 11/17/2022 1330   LYMPHSABS 1.6 12/22/2021 0800   MONOABS 0.8 12/22/2021 0800   EOSABS 0.0 12/22/2021 0800   BASOSABS 0.1 12/22/2021 0800    No results found for: "POCLITH", "LITHIUM"   No results  found for: "PHENYTOIN", "PHENOBARB", "VALPROATE", "CBMZ"   .res Assessment: Plan:    Plan:  PDMP reviewed  Buspar 15mg  BID - PCP Wellbutrin SR 150mg  BID - PCP  Prozac 40mg  daily  Trazadone 100mg  at bedtime Ativan 0.5mg  BID daily prn as needed - hasn't taken in a month  Vraylar 3mg  daily - patient assistance approved  Time spent with patient was 25 minutes. Greater than 50% of face to face time with patient was spent on counseling and coordination of care.    RTC 2 months  Patient advised to contact office with any questions, adverse effects, or acute worsening in signs and symptoms.   Discussed potential benefits, risk, and side effects of benzodiazepines to include potential risk of tolerance and dependence, as well as possible drowsiness. Advised patient not to drive if experiencing drowsiness and to take lowest possible effective dose to minimize risk of dependence and tolerance.   There are no diagnoses linked to this encounter.   Please see After Visit Summary for patient specific instructions.  Future Appointments  Date Time Provider Department Center  11/23/2022 12:45 PM [facility] 1 [facility] [place] [facility]  04/04/2023  1:15 PM Glean Salvo, NP [facility] None    No orders of the defined types were placed in this encounter.   -------------------------------

## 2022-11-23 ENCOUNTER — Ambulatory Visit (HOSPITAL_COMMUNITY): Payer: Medicare HMO

## 2022-11-27 NOTE — Anesthesia Preprocedure Evaluation (Signed)
Anesthesia Evaluation  Patient identified by MRN, date of birth, ID band Patient awake    Reviewed: Allergy & Precautions, H&P , NPO status , Patient's Chart, lab work & pertinent test results  History of Anesthesia Complications (+) PONV and history of anesthetic complications  Airway Mallampati: III  TM Distance: >3 FB Neck ROM: Full    Dental no notable dental hx. (+) Teeth Intact, Dental Advisory Given   Pulmonary asthma , former smoker   Pulmonary exam normal breath sounds clear to auscultation       Cardiovascular Exercise Tolerance: Good hypertension,  Rhythm:Regular Rate:Normal     Neuro/Psych  Headaches  Anxiety Depression       GI/Hepatic Neg liver ROS,GERD  Medicated,,  Endo/Other  diabetes, Insulin DependentHypothyroidism    Renal/GU negative Renal ROS  negative genitourinary   Musculoskeletal  (+) Arthritis , Osteoarthritis,  Fibromyalgia -  Abdominal   Peds  Hematology  (+) Blood dyscrasia, anemia   Anesthesia Other Findings   Reproductive/Obstetrics negative OB ROS                             Anesthesia Physical Anesthesia Plan  ASA: 3  Anesthesia Plan: Spinal   Post-op Pain Management: Regional block* and Tylenol PO (pre-op)*   Induction: Intravenous  PONV Risk Score and Plan: 4 or greater and Ondansetron, Dexamethasone, Propofol infusion and Midazolam  Airway Management Planned: Natural Airway and Simple Face Mask  Additional Equipment:   Intra-op Plan:   Post-operative Plan:   Informed Consent: I have reviewed the patients History and Physical, chart, labs and discussed the procedure including the risks, benefits and alternatives for the proposed anesthesia with the patient or authorized representative who has indicated his/her understanding and acceptance.     Dental advisory given  Plan Discussed with: CRNA  Anesthesia Plan Comments:         Anesthesia Quick Evaluation

## 2022-11-28 ENCOUNTER — Ambulatory Visit (HOSPITAL_COMMUNITY): Payer: Medicare HMO | Admitting: Physician Assistant

## 2022-11-28 ENCOUNTER — Other Ambulatory Visit: Payer: Self-pay

## 2022-11-28 ENCOUNTER — Observation Stay (HOSPITAL_COMMUNITY)
Admission: RE | Admit: 2022-11-28 | Discharge: 2022-11-29 | Disposition: A | Discharge status: Home / Self Care | Payer: Medicare HMO | Attending: Orthopedic Surgery | Admitting: Orthopedic Surgery

## 2022-11-28 ENCOUNTER — Ambulatory Visit (HOSPITAL_BASED_OUTPATIENT_CLINIC_OR_DEPARTMENT_OTHER): Payer: Medicare HMO | Admitting: Anesthesiology

## 2022-11-28 ENCOUNTER — Encounter (HOSPITAL_COMMUNITY): Payer: Self-pay | Admitting: Orthopedic Surgery

## 2022-11-28 ENCOUNTER — Encounter (HOSPITAL_COMMUNITY)
Admission: RE | Disposition: A | Discharge status: Home / Self Care | Payer: Self-pay | Source: Home / Self Care | Attending: Orthopedic Surgery

## 2022-11-28 DIAGNOSIS — J45909 Unspecified asthma, uncomplicated: Secondary | ICD-10-CM | POA: Diagnosis not present

## 2022-11-28 DIAGNOSIS — I10 Essential (primary) hypertension: Secondary | ICD-10-CM | POA: Insufficient documentation

## 2022-11-28 DIAGNOSIS — E039 Hypothyroidism, unspecified: Secondary | ICD-10-CM | POA: Diagnosis not present

## 2022-11-28 DIAGNOSIS — D638 Anemia in other chronic diseases classified elsewhere: Secondary | ICD-10-CM

## 2022-11-28 DIAGNOSIS — Z96641 Presence of right artificial hip joint: Secondary | ICD-10-CM | POA: Insufficient documentation

## 2022-11-28 DIAGNOSIS — Z794 Long term (current) use of insulin: Secondary | ICD-10-CM | POA: Diagnosis not present

## 2022-11-28 DIAGNOSIS — Z87891 Personal history of nicotine dependence: Secondary | ICD-10-CM | POA: Diagnosis not present

## 2022-11-28 DIAGNOSIS — E119 Type 2 diabetes mellitus without complications: Secondary | ICD-10-CM | POA: Insufficient documentation

## 2022-11-28 DIAGNOSIS — M1711 Unilateral primary osteoarthritis, right knee: Secondary | ICD-10-CM | POA: Diagnosis present

## 2022-11-28 DIAGNOSIS — Z79899 Other long term (current) drug therapy: Secondary | ICD-10-CM | POA: Insufficient documentation

## 2022-11-28 DIAGNOSIS — Z01818 Encounter for other preprocedural examination: Secondary | ICD-10-CM

## 2022-11-28 DIAGNOSIS — M179 Osteoarthritis of knee, unspecified: Secondary | ICD-10-CM | POA: Diagnosis present

## 2022-11-28 HISTORY — PX: TOTAL KNEE ARTHROPLASTY: SHX125

## 2022-11-28 LAB — GLUCOSE, CAPILLARY
Glucose-Capillary: 158 mg/dL — ABNORMAL HIGH (ref 70–99)
Glucose-Capillary: 145 mg/dL — ABNORMAL HIGH (ref 70–99)
Glucose-Capillary: 164 mg/dL — ABNORMAL HIGH (ref 70–99)
Glucose-Capillary: 234 mg/dL — ABNORMAL HIGH (ref 70–99)
Glucose-Capillary: 260 mg/dL — ABNORMAL HIGH (ref 70–99)

## 2022-11-28 LAB — HEMOGLOBIN A1C
Hgb A1c MFr Bld: 5.8 % — ABNORMAL HIGH (ref 4.8–5.6)
Mean Plasma Glucose: 119.76 mg/dL

## 2022-11-28 SURGERY — ARTHROPLASTY, KNEE, TOTAL
Anesthesia: Spinal | Site: Knee | Laterality: Right

## 2022-11-28 MED ORDER — DEXAMETHASONE SODIUM PHOSPHATE 10 MG/ML IJ SOLN
8.0000 mg | Freq: Once | INTRAMUSCULAR | Status: AC
  Administered 2022-11-28: 8 mg via INTRAVENOUS

## 2022-11-28 MED ORDER — OXYCODONE HCL 5 MG PO TABS
15.0000 mg | ORAL_TABLET | Freq: Four times a day (QID) | ORAL | Status: DC
  Administered 2022-11-28 – 2022-11-29 (×5): 15 mg via ORAL
  Filled 2022-11-28 (×5): qty 3

## 2022-11-28 MED ORDER — MIDAZOLAM HCL 2 MG/2ML IJ SOLN
INTRAMUSCULAR | Status: AC
  Filled 2022-11-28: qty 2

## 2022-11-28 MED ORDER — ACETAMINOPHEN 500 MG PO TABS: 1000.0000 mg | ORAL_TABLET | Freq: Once | ORAL | Status: AC

## 2022-11-28 MED ORDER — ACETAMINOPHEN 10 MG/ML IV SOLN
1000.0000 mg | Freq: Four times a day (QID) | INTRAVENOUS | Status: DC
  Administered 2022-11-28: 1000 mg via INTRAVENOUS
  Filled 2022-11-28: qty 100

## 2022-11-28 MED ORDER — SUMATRIPTAN SUCCINATE 50 MG PO TABS: 100.0000 mg | ORAL_TABLET | Freq: Four times a day (QID) | ORAL | Status: DC | PRN

## 2022-11-28 MED ORDER — FUROSEMIDE 20 MG PO TABS: 20.0000 mg | ORAL_TABLET | ORAL | Status: DC

## 2022-11-28 MED ORDER — PHENOL 1.4 % MT LIQD: 1.0000 | OROMUCOSAL | Status: DC | PRN

## 2022-11-28 MED ORDER — BUPIVACAINE IN DEXTROSE 0.75-8.25 % IT SOLN
INTRATHECAL | Status: DC | PRN
  Administered 2022-11-28: 1.6 mL via INTRATHECAL

## 2022-11-28 MED ORDER — BUPIVACAINE-EPINEPHRINE (PF) 0.5% -1:200000 IJ SOLN
INTRAMUSCULAR | Status: DC | PRN
  Administered 2022-11-28: 20 mL via PERINEURAL

## 2022-11-28 MED ORDER — TRAZODONE HCL 100 MG PO TABS
100.0000 mg | ORAL_TABLET | Freq: Once | ORAL | Status: AC
  Administered 2022-11-28: 100 mg via ORAL
  Filled 2022-11-28: qty 1

## 2022-11-28 MED ORDER — PROPOFOL 10 MG/ML IV BOLUS
INTRAVENOUS | Status: AC
  Filled 2022-11-28: qty 20

## 2022-11-28 MED ORDER — BUSPIRONE HCL 5 MG PO TABS
15.0000 mg | ORAL_TABLET | Freq: Two times a day (BID) | ORAL | Status: DC
  Administered 2022-11-28 – 2022-11-29 (×2): 15 mg via ORAL
  Filled 2022-11-28 (×2): qty 1

## 2022-11-28 MED ORDER — ONDANSETRON HCL 4 MG/2ML IJ SOLN
INTRAMUSCULAR | Status: DC | PRN
  Administered 2022-11-28: 4 mg via INTRAVENOUS

## 2022-11-28 MED ORDER — PRAVASTATIN SODIUM 20 MG PO TABS
40.0000 mg | ORAL_TABLET | Freq: Every day | ORAL | Status: DC
  Administered 2022-11-28: 40 mg via ORAL
  Filled 2022-11-28: qty 2

## 2022-11-28 MED ORDER — ORAL CARE MOUTH RINSE: 15.0000 mL | Freq: Once | OROMUCOSAL | Status: AC

## 2022-11-28 MED ORDER — BUPIVACAINE LIPOSOME 1.3 % IJ SUSP
INTRAMUSCULAR | Status: DC | PRN
  Administered 2022-11-28: 20 mL

## 2022-11-28 MED ORDER — PROPOFOL 10 MG/ML IV BOLUS
INTRAVENOUS | Status: DC | PRN
  Administered 2022-11-28: 30 mg via INTRAVENOUS

## 2022-11-28 MED ORDER — LEVOCETIRIZINE DIHYDROCHLORIDE 5 MG PO TABS: 5.0000 mg | ORAL_TABLET | Freq: Every evening | ORAL | Status: DC

## 2022-11-28 MED ORDER — POLYETHYLENE GLYCOL 3350 17 G PO PACK: 17.0000 g | PACK | Freq: Every day | ORAL | Status: DC | PRN

## 2022-11-28 MED ORDER — ACETAMINOPHEN 500 MG PO TABS
1000.0000 mg | ORAL_TABLET | Freq: Four times a day (QID) | ORAL | Status: AC
  Administered 2022-11-28 – 2022-11-29 (×3): 1000 mg via ORAL
  Filled 2022-11-28 (×4): qty 2

## 2022-11-28 MED ORDER — LEVOTHYROXINE SODIUM 50 MCG PO TABS
175.0000 ug | ORAL_TABLET | Freq: Every day | ORAL | Status: DC
  Administered 2022-11-29: 175 ug via ORAL
  Filled 2022-11-28: qty 1

## 2022-11-28 MED ORDER — SODIUM CHLORIDE 0.9 % IR SOLN
Status: DC | PRN
  Administered 2022-11-28: 1000 mL

## 2022-11-28 MED ORDER — LIDOCAINE HCL (PF) 2 % IJ SOLN
INTRAMUSCULAR | Status: AC
  Filled 2022-11-28: qty 5

## 2022-11-28 MED ORDER — LIDOCAINE HCL (CARDIAC) PF 100 MG/5ML IV SOSY
PREFILLED_SYRINGE | INTRAVENOUS | Status: DC | PRN
  Administered 2022-11-28: 40 mg via INTRAVENOUS

## 2022-11-28 MED ORDER — INSULIN ASPART 100 UNIT/ML IJ SOLN: 8.0000 [IU] | Freq: Three times a day (TID) | INTRAMUSCULAR | Status: DC

## 2022-11-28 MED ORDER — BUPROPION HCL ER (SR) 150 MG PO TB12
150.0000 mg | ORAL_TABLET | Freq: Two times a day (BID) | ORAL | Status: DC
  Administered 2022-11-28 – 2022-11-29 (×2): 150 mg via ORAL
  Filled 2022-11-28 (×2): qty 1

## 2022-11-28 MED ORDER — CHLORHEXIDINE GLUCONATE 0.12 % MT SOLN
15.0000 mL | Freq: Once | OROMUCOSAL | Status: AC
  Administered 2022-11-28: 15 mL via OROMUCOSAL

## 2022-11-28 MED ORDER — PROPOFOL 500 MG/50ML IV EMUL
INTRAVENOUS | Status: DC | PRN
  Administered 2022-11-28: 85 ug/kg/min via INTRAVENOUS

## 2022-11-28 MED ORDER — SODIUM CHLORIDE (PF) 0.9 % IJ SOLN
INTRAMUSCULAR | Status: AC
  Filled 2022-11-28: qty 10

## 2022-11-28 MED ORDER — INSULIN ASPART 100 UNIT/ML IJ SOLN
0.0000 [IU] | Freq: Every day | INTRAMUSCULAR | Status: DC
  Administered 2022-11-28: 3 [IU] via SUBCUTANEOUS

## 2022-11-28 MED ORDER — MECLIZINE HCL 25 MG PO TABS: 25.0000 mg | ORAL_TABLET | Freq: Three times a day (TID) | ORAL | Status: DC | PRN

## 2022-11-28 MED ORDER — DIPHENHYDRAMINE HCL 12.5 MG/5ML PO ELIX: 12.5000 mg | ORAL_SOLUTION | ORAL | Status: DC | PRN

## 2022-11-28 MED ORDER — SODIUM CHLORIDE (PF) 0.9 % IJ SOLN
INTRAMUSCULAR | Status: DC | PRN
  Administered 2022-11-28: 60 mL

## 2022-11-28 MED ORDER — EPHEDRINE 5 MG/ML INJ
INTRAVENOUS | Status: AC
  Filled 2022-11-28: qty 5

## 2022-11-28 MED ORDER — SODIUM CHLORIDE 0.9 % IV SOLN: INTRAVENOUS | Status: DC

## 2022-11-28 MED ORDER — MENTHOL 3 MG MT LOZG: 1.0000 | LOZENGE | OROMUCOSAL | Status: DC | PRN

## 2022-11-28 MED ORDER — METOCLOPRAMIDE HCL 5 MG PO TABS: 5.0000 mg | ORAL_TABLET | Freq: Three times a day (TID) | ORAL | Status: DC | PRN

## 2022-11-28 MED ORDER — METHOCARBAMOL 500 MG PO TABS
500.0000 mg | ORAL_TABLET | Freq: Four times a day (QID) | ORAL | Status: DC | PRN
  Administered 2022-11-28 – 2022-11-29 (×4): 500 mg via ORAL
  Filled 2022-11-28 (×4): qty 1

## 2022-11-28 MED ORDER — LACTATED RINGERS IV SOLN: INTRAVENOUS | Status: DC

## 2022-11-28 MED ORDER — FENTANYL CITRATE PF 50 MCG/ML IJ SOSY: 25.0000 ug | PREFILLED_SYRINGE | INTRAMUSCULAR | Status: DC | PRN

## 2022-11-28 MED ORDER — ASPIRIN 81 MG PO CHEW
81.0000 mg | CHEWABLE_TABLET | Freq: Two times a day (BID) | ORAL | Status: DC
  Administered 2022-11-29: 81 mg via ORAL
  Filled 2022-11-28: qty 1

## 2022-11-28 MED ORDER — SODIUM CHLORIDE (PF) 0.9 % IJ SOLN
INTRAMUSCULAR | Status: AC
  Filled 2022-11-28: qty 50

## 2022-11-28 MED ORDER — 0.9 % SODIUM CHLORIDE (POUR BTL) OPTIME
TOPICAL | Status: DC | PRN
  Administered 2022-11-28: 1000 mL

## 2022-11-28 MED ORDER — LORAZEPAM 0.5 MG PO TABS: 0.5000 mg | ORAL_TABLET | Freq: Every day | ORAL | Status: DC | PRN

## 2022-11-28 MED ORDER — VANCOMYCIN HCL IN DEXTROSE 1-5 GM/200ML-% IV SOLN
1000.0000 mg | INTRAVENOUS | Status: AC
  Administered 2022-11-28: 1000 mg via INTRAVENOUS
  Filled 2022-11-28: qty 200

## 2022-11-28 MED ORDER — METRONIDAZOLE 500 MG PO TABS: 500.0000 mg | ORAL_TABLET | Freq: Two times a day (BID) | ORAL | Status: DC

## 2022-11-28 MED ORDER — TRANEXAMIC ACID-NACL 1000-0.7 MG/100ML-% IV SOLN
1000.0000 mg | INTRAVENOUS | Status: AC
  Administered 2022-11-28: 1000 mg via INTRAVENOUS
  Filled 2022-11-28: qty 100

## 2022-11-28 MED ORDER — INSULIN ASPART 100 UNIT/ML IJ SOLN: 0.0000 [IU] | INTRAMUSCULAR | Status: DC | PRN

## 2022-11-28 MED ORDER — INSULIN GLARGINE-YFGN 100 UNIT/ML ~~LOC~~ SOLN
44.0000 [IU] | Freq: Every day | SUBCUTANEOUS | Status: DC
  Administered 2022-11-28: 44 [IU] via SUBCUTANEOUS
  Filled 2022-11-28 (×2): qty 0.44

## 2022-11-28 MED ORDER — FLEET ENEMA 7-19 GM/118ML RE ENEM: 1.0000 | ENEMA | Freq: Once | RECTAL | Status: DC | PRN

## 2022-11-28 MED ORDER — CEFAZOLIN SODIUM-DEXTROSE 2-4 GM/100ML-% IV SOLN: 2.0000 g | Freq: Four times a day (QID) | INTRAVENOUS | Status: DC

## 2022-11-28 MED ORDER — MORPHINE SULFATE (PF) 2 MG/ML IV SOLN
1.0000 mg | INTRAVENOUS | Status: DC | PRN
  Administered 2022-11-28: 1 mg via INTRAVENOUS
  Administered 2022-11-28: 2 mg via INTRAVENOUS
  Filled 2022-11-28 (×2): qty 1

## 2022-11-28 MED ORDER — BUPIVACAINE LIPOSOME 1.3 % IJ SUSP: 20.0000 mL | Freq: Once | INTRAMUSCULAR | Status: DC

## 2022-11-28 MED ORDER — CARIPRAZINE HCL 1.5 MG PO CAPS
3.0000 mg | ORAL_CAPSULE | Freq: Every day | ORAL | Status: DC
  Administered 2022-11-29: 3 mg via ORAL
  Filled 2022-11-28: qty 2

## 2022-11-28 MED ORDER — MIDAZOLAM HCL 2 MG/2ML IJ SOLN
INTRAMUSCULAR | Status: DC | PRN
  Administered 2022-11-28: 2 mg via INTRAVENOUS

## 2022-11-28 MED ORDER — OXYBUTYNIN CHLORIDE 5 MG PO TABS
5.0000 mg | ORAL_TABLET | Freq: Every day | ORAL | Status: DC
  Filled 2022-11-28: qty 1

## 2022-11-28 MED ORDER — INSULIN ASPART 100 UNIT/ML IJ SOLN
INTRAMUSCULAR | Status: AC
  Administered 2022-11-28: 2 [IU] via SUBCUTANEOUS
  Filled 2022-11-28: qty 1

## 2022-11-28 MED ORDER — OXYCODONE HCL 5 MG PO TABS
5.0000 mg | ORAL_TABLET | ORAL | Status: DC | PRN
  Administered 2022-11-28 – 2022-11-29 (×4): 10 mg via ORAL
  Filled 2022-11-28 (×4): qty 2

## 2022-11-28 MED ORDER — PANTOPRAZOLE SODIUM 40 MG PO TBEC
40.0000 mg | DELAYED_RELEASE_TABLET | Freq: Every day | ORAL | Status: DC
  Administered 2022-11-29: 40 mg via ORAL
  Filled 2022-11-28: qty 1

## 2022-11-28 MED ORDER — PROPOFOL 1000 MG/100ML IV EMUL
INTRAVENOUS | Status: AC
  Filled 2022-11-28: qty 100

## 2022-11-28 MED ORDER — GABAPENTIN 300 MG PO CAPS
600.0000 mg | ORAL_CAPSULE | Freq: Four times a day (QID) | ORAL | Status: DC
  Administered 2022-11-28 – 2022-11-29 (×5): 600 mg via ORAL
  Filled 2022-11-28 (×5): qty 2

## 2022-11-28 MED ORDER — METOCLOPRAMIDE HCL 5 MG/ML IJ SOLN: 5.0000 mg | Freq: Three times a day (TID) | INTRAMUSCULAR | Status: DC | PRN

## 2022-11-28 MED ORDER — INSULIN ASPART 100 UNIT/ML IJ SOLN
0.0000 [IU] | Freq: Three times a day (TID) | INTRAMUSCULAR | Status: DC
  Administered 2022-11-28 – 2022-11-29 (×2): 5 [IU] via SUBCUTANEOUS
  Administered 2022-11-29: 3 [IU] via SUBCUTANEOUS

## 2022-11-28 MED ORDER — FENTANYL CITRATE (PF) 100 MCG/2ML IJ SOLN
INTRAMUSCULAR | Status: DC | PRN
  Administered 2022-11-28: 50 ug via INTRAVENOUS

## 2022-11-28 MED ORDER — BISACODYL 10 MG RE SUPP: 10.0000 mg | Freq: Every day | RECTAL | Status: DC | PRN

## 2022-11-28 MED ORDER — LORATADINE 10 MG PO TABS
10.0000 mg | ORAL_TABLET | Freq: Every day | ORAL | Status: DC
  Administered 2022-11-28 – 2022-11-29 (×2): 10 mg via ORAL
  Filled 2022-11-28 (×2): qty 1

## 2022-11-28 MED ORDER — METHOCARBAMOL 1000 MG/10ML IJ SOLN: 500.0000 mg | Freq: Four times a day (QID) | INTRAVENOUS | Status: DC | PRN

## 2022-11-28 MED ORDER — PHENYLEPHRINE HCL-NACL 20-0.9 MG/250ML-% IV SOLN
INTRAVENOUS | Status: AC
  Filled 2022-11-28: qty 250

## 2022-11-28 MED ORDER — MONTELUKAST SODIUM 10 MG PO TABS
10.0000 mg | ORAL_TABLET | Freq: Every day | ORAL | Status: DC
  Administered 2022-11-29: 10 mg via ORAL
  Filled 2022-11-28: qty 1

## 2022-11-28 MED ORDER — DEXAMETHASONE SODIUM PHOSPHATE 10 MG/ML IJ SOLN
INTRAMUSCULAR | Status: AC
  Filled 2022-11-28: qty 1

## 2022-11-28 MED ORDER — FLUOXETINE HCL 20 MG PO CAPS
40.0000 mg | ORAL_CAPSULE | Freq: Every day | ORAL | Status: DC
  Filled 2022-11-28: qty 2

## 2022-11-28 MED ORDER — DOCUSATE SODIUM 100 MG PO CAPS
100.0000 mg | ORAL_CAPSULE | Freq: Two times a day (BID) | ORAL | Status: DC
  Administered 2022-11-28 – 2022-11-29 (×3): 100 mg via ORAL
  Filled 2022-11-28 (×3): qty 1

## 2022-11-28 MED ORDER — POVIDONE-IODINE 10 % EX SWAB: 2.0000 | Freq: Once | CUTANEOUS | Status: DC

## 2022-11-28 MED ORDER — FENTANYL CITRATE (PF) 100 MCG/2ML IJ SOLN
INTRAMUSCULAR | Status: AC
  Filled 2022-11-28: qty 2

## 2022-11-28 MED ORDER — ONDANSETRON HCL 4 MG PO TABS: 4.0000 mg | ORAL_TABLET | Freq: Four times a day (QID) | ORAL | Status: DC | PRN

## 2022-11-28 MED ORDER — ONDANSETRON HCL 4 MG/2ML IJ SOLN
4.0000 mg | Freq: Four times a day (QID) | INTRAMUSCULAR | Status: DC | PRN
  Administered 2022-11-29 (×2): 4 mg via INTRAVENOUS
  Filled 2022-11-28 (×2): qty 2

## 2022-11-28 MED ORDER — ONDANSETRON HCL 4 MG/2ML IJ SOLN
INTRAMUSCULAR | Status: AC
  Filled 2022-11-28: qty 2

## 2022-11-28 MED ORDER — VANCOMYCIN HCL IN DEXTROSE 1-5 GM/200ML-% IV SOLN
1000.0000 mg | Freq: Two times a day (BID) | INTRAVENOUS | Status: AC
  Administered 2022-11-28: 1000 mg via INTRAVENOUS
  Filled 2022-11-28: qty 200

## 2022-11-28 SURGICAL SUPPLY — 58 items
ADH SKN CLS APL DERMABOND .7 (GAUZE/BANDAGES/DRESSINGS) ×1
ATTUNE PS FEM RT SZ 6 CEM KNEE (Femur) IMPLANT
ATTUNE PSRP INSR SZ6 7 KNEE (Insert) IMPLANT
BAG COUNTER SPONGE SURGICOUNT (BAG) IMPLANT
BAG SPEC THK2 15X12 ZIP CLS (MISCELLANEOUS) ×1
BAG SPNG CNTER NS LX DISP (BAG) ×1
BAG ZIPLOCK 12X15 (MISCELLANEOUS) ×1 IMPLANT
BASE TIBIAL ROT PLAT SZ 5 KNEE (Knees) IMPLANT
BLADE SAG 18X100X1.27 (BLADE) ×1 IMPLANT
BLADE SAW SGTL 11.0X1.19X90.0M (BLADE) ×1 IMPLANT
BNDG CMPR 5X62 HK CLSR LF (GAUZE/BANDAGES/DRESSINGS) ×1
BNDG CMPR MED 15X6 ELC VLCR LF (GAUZE/BANDAGES/DRESSINGS) ×1
BNDG ELASTIC 6INX 5YD STR LF (GAUZE/BANDAGES/DRESSINGS) ×1 IMPLANT
BNDG ELASTIC 6X15 VLCR STRL LF (GAUZE/BANDAGES/DRESSINGS) IMPLANT
BOWL SMART MIX CTS (DISPOSABLE) ×1 IMPLANT
BSPLAT TIB 5 CMNT ROT PLAT STR (Knees) ×1 IMPLANT
CEMENT HV SMART SET (Cement) ×2 IMPLANT
COVER SURGICAL LIGHT HANDLE (MISCELLANEOUS) ×1 IMPLANT
CUFF TOURN SGL QUICK 34 (TOURNIQUET CUFF) ×1
CUFF TRNQT CYL 34X4.125X (TOURNIQUET CUFF) ×1 IMPLANT
DERMABOND ADVANCED .7 DNX12 (GAUZE/BANDAGES/DRESSINGS) ×1 IMPLANT
DRAPE INCISE IOBAN 66X45 STRL (DRAPES) ×1 IMPLANT
DRAPE U-SHAPE 47X51 STRL (DRAPES) ×1 IMPLANT
DRSG AQUACEL AG ADV 3.5X10 (GAUZE/BANDAGES/DRESSINGS) ×1 IMPLANT
DURAPREP 26ML APPLICATOR (WOUND CARE) ×1 IMPLANT
ELECT REM PT RETURN 15FT ADLT (MISCELLANEOUS) ×1 IMPLANT
GLOVE BIO SURGEON STRL SZ 6.5 (GLOVE) IMPLANT
GLOVE BIO SURGEON STRL SZ8 (GLOVE) ×1 IMPLANT
GLOVE BIOGEL PI IND STRL 6.5 (GLOVE) IMPLANT
GLOVE BIOGEL PI IND STRL 7.0 (GLOVE) IMPLANT
GLOVE BIOGEL PI IND STRL 8 (GLOVE) ×1 IMPLANT
GOWN STRL REUS W/ TWL LRG LVL3 (GOWN DISPOSABLE) ×1 IMPLANT
GOWN STRL REUS W/TWL LRG LVL3 (GOWN DISPOSABLE) ×1
HANDPIECE INTERPULSE COAX TIP (DISPOSABLE) ×1
HOLDER FOLEY CATH W/STRAP (MISCELLANEOUS) IMPLANT
IMMOBILIZER KNEE 20 (SOFTGOODS) ×1
IMMOBILIZER KNEE 20 THIGH 36 (SOFTGOODS) ×1 IMPLANT
KIT TURNOVER KIT A (KITS) IMPLANT
MANIFOLD NEPTUNE II (INSTRUMENTS) ×1 IMPLANT
NS IRRIG 1000ML POUR BTL (IV SOLUTION) ×1 IMPLANT
PACK TOTAL KNEE CUSTOM (KITS) ×1 IMPLANT
PADDING CAST COTTON 6X4 STRL (CAST SUPPLIES) ×2 IMPLANT
PATELLA MEDIAL ATTUN 35MM KNEE (Knees) IMPLANT
PIN STEINMAN FIXATION KNEE (PIN) IMPLANT
PROTECTOR NERVE ULNAR (MISCELLANEOUS) ×1 IMPLANT
SET HNDPC FAN SPRY TIP SCT (DISPOSABLE) ×1 IMPLANT
SPIKE FLUID TRANSFER (MISCELLANEOUS) ×1 IMPLANT
SUT MNCRL AB 4-0 PS2 18 (SUTURE) ×1 IMPLANT
SUT STRATAFIX 0 PDS 27 VIOLET (SUTURE) ×1
SUT VIC AB 2-0 CT1 27 (SUTURE) ×3
SUT VIC AB 2-0 CT1 TAPERPNT 27 (SUTURE) ×3 IMPLANT
SUTURE STRATFX 0 PDS 27 VIOLET (SUTURE) ×1 IMPLANT
TIBIAL BASE ROT PLAT SZ 5 KNEE (Knees) ×1 IMPLANT
TRAY FOLEY MTR SLVR 14FR STAT (SET/KITS/TRAYS/PACK) IMPLANT
TRAY FOLEY MTR SLVR 16FR STAT (SET/KITS/TRAYS/PACK) ×1 IMPLANT
TUBE SUCTION HIGH CAP CLEAR NV (SUCTIONS) ×1 IMPLANT
WATER STERILE IRR 1000ML POUR (IV SOLUTION) ×2 IMPLANT
WRAP KNEE MAXI GEL POST OP (GAUZE/BANDAGES/DRESSINGS) ×1 IMPLANT

## 2022-11-28 NOTE — Addendum Note (Signed)
Addendum  created 11/28/22 1222 by Oletha Cruel, CRNA   Flowsheet accepted

## 2022-11-28 NOTE — Anesthesia Postprocedure Evaluation (Signed)
Anesthesia Post Note  Patient: [name]  Procedure(s) Performed: TOTAL KNEE ARTHROPLASTY (Right: Knee)     Patient location during evaluation: PACU Anesthesia Type: Spinal and Regional Level of consciousness: oriented and awake and alert Pain management: pain level controlled Vital Signs Assessment: post-procedure vital signs reviewed and stable Respiratory status: spontaneous breathing and respiratory function stable Cardiovascular status: blood pressure returned to baseline and stable Postop Assessment: no headache, no backache, no apparent nausea or vomiting, spinal receding and patient able to bend at knees Anesthetic complications: no  No notable events documented.  Last Vitals:  Vitals:   11/28/22 0930 11/28/22 0947  BP: (!) 120/55 128/63  Pulse: (!) 57 (!) 58  Resp: 18 17  Temp:  36.5 C  SpO2: 96% 96%    Last Pain:  Vitals:   11/28/22 0947  TempSrc:   PainSc: 0-No pain    LLE Motor Response: Purposeful movement (11/28/22 0947) LLE Sensation: Decreased;Numbness (11/28/22 0947) RLE Motor Response: Purposeful movement (11/28/22 0947) RLE Sensation: Decreased;Numbness (11/28/22 0947) L Sensory Level: L4-Anterior knee, lower leg (11/28/22 0947) R Sensory Level: L4-Anterior knee, lower leg (11/28/22 0947)  Karrina Lye,W. EDMOND

## 2022-11-28 NOTE — Discharge Instructions (Signed)
 Frank Aluisio, MD Total Joint Specialist [facility] [address] [place], [place] [address] [phone]  TOTAL KNEE REPLACEMENT POSTOPERATIVE DIRECTIONS    Knee Rehabilitation, Guidelines Following Surgery  Results after knee surgery are often greatly improved when you follow the exercise, range of motion and muscle strengthening exercises prescribed by your doctor. Safety measures are also important to protect the knee from further injury. If any of these exercises cause you to have increased pain or swelling in your knee joint, decrease the amount until you are comfortable again and slowly increase them. If you have problems or questions, call your caregiver or physical therapist for advice.   BLOOD CLOT PREVENTION Take 81 mg Aspirin two times a day for three weeks following surgery. Then take an 81 mg Aspirin once a day for three weeks. Then discontinue Aspirin. You may resume your vitamins/supplements upon discharge from the hospital. Do not take any NSAIDs (Advil, Aleve, Ibuprofen, Meloxicam, etc.) for 3 weeks, while taking 81mg Aspirin twice a day.   HOME CARE INSTRUCTIONS  Remove items at home which could result in a fall. This includes throw rugs or furniture in walking pathways.  ICE to the affected knee as much as tolerated. Icing helps control swelling. If the swelling is well controlled you will be more comfortable and rehab easier. Continue to use ice on the knee for pain and swelling from surgery. You may notice swelling that will progress down to the foot and ankle. This is normal after surgery. Elevate the leg when you are not up walking on it.    Continue to use the breathing machine which will help keep your temperature down. It is common for your temperature to cycle up and down following surgery, especially at night when you are not up moving around and exerting yourself. The breathing machine keeps your lungs expanded and your temperature  down. Do not place pillow under the operative knee, focus on keeping the knee straight while resting  DIET You may resume your previous home diet once you are discharged from the hospital.  DRESSING / WOUND CARE / SHOWERING Keep your bulky bandage on for 2 days. On the third post-operative day you may remove the Ace bandage and gauze. There is a waterproof adhesive bandage on your skin which will stay in place until your first follow-up appointment. Once you remove this you will not need to place another bandage You may begin showering 3 days following surgery, but do not submerge the incision under water.  ACTIVITY For the first 5 days, the key is rest and control of pain and swelling Do your home exercises twice a day starting on post-operative day 3. On the days you go to physical therapy, just do the home exercises once that day. You should rest, ice and elevate the leg for 50 minutes out of every hour. Get up and walk/stretch for 10 minutes per hour. After 5 days you can increase your activity slowly as tolerated. Walk with your walker as instructed. Use the walker until you are comfortable transitioning to a cane. Walk with the cane in the opposite hand of the operative leg. You may discontinue the cane once you are comfortable and walking steadily. Avoid periods of inactivity such as sitting longer than an hour when not asleep. This helps prevent blood clots.  You may discontinue the knee immobilizer once you are able to perform a straight leg raise while lying down. You may resume a sexual relationship in   one month or when given the OK by your doctor.  You may return to work once you are cleared by your doctor.  Do not drive a car for 6 weeks or until released by your surgeon.  Do not drive while taking narcotics.  TED HOSE STOCKINGS Wear the elastic stockings on both legs for three weeks following surgery during the day. You may remove them at night for sleeping.  WEIGHT  BEARING Weight bearing as tolerated with assist device (walker, cane, etc) as directed, use it as long as suggested by your surgeon or therapist, typically at least 4-6 weeks.  POSTOPERATIVE CONSTIPATION PROTOCOL Constipation - defined medically as fewer than three stools per week and severe constipation as less than one stool per week.  One of the most common issues patients have following surgery is constipation.  Even if you have a regular bowel pattern at home, your normal regimen is likely to be disrupted due to multiple reasons following surgery.  Combination of anesthesia, postoperative narcotics, change in appetite and fluid intake all can affect your bowels.  In order to avoid complications following surgery, here are some recommendations in order to help you during your recovery period.  Colace (docusate) - Pick up an over-the-counter form of Colace or another stool softener and take twice a day as long as you are requiring postoperative pain medications.  Take with a full glass of water daily.  If you experience loose stools or diarrhea, hold the colace until you stool forms back up. If your symptoms do not get better within 1 week or if they get worse, check with your doctor. Dulcolax (bisacodyl) - Pick up over-the-counter and take as directed by the product packaging as needed to assist with the movement of your bowels.  Take with a full glass of water.  Use this product as needed if not relieved by Colace only.  MiraLax (polyethylene glycol) - Pick up over-the-counter to have on hand. MiraLax is a solution that will increase the amount of water in your bowels to assist with bowel movements.  Take as directed and can mix with a glass of water, juice, soda, coffee, or tea. Take if you go more than two days without a movement. Do not use MiraLax more than once per day. Call your doctor if you are still constipated or irregular after using this medication for 7 days in a row.  If you continue  to have problems with postoperative constipation, please contact the office for further assistance and recommendations.  If you experience "the worst abdominal pain ever" or develop nausea or vomiting, please contact the office immediatly for further recommendations for treatment.  ITCHING If you experience itching with your medications, try taking only a single pain pill, or even half a pain pill at a time.  You can also use Benadryl over the counter for itching or also to help with sleep.   MEDICATIONS See your medication summary on the "After Visit Summary" that the nursing staff will review with you prior to discharge.  You may have some home medications which will be placed on hold until you complete the course of blood thinner medication.  It is important for you to complete the blood thinner medication as prescribed by your surgeon.  Continue your approved medications as instructed at time of discharge.  PRECAUTIONS If you experience chest pain or shortness of breath - call 911 immediately for transfer to the hospital emergency department.  If you develop a fever greater that   101 F, purulent drainage from wound, increased redness or drainage from wound, foul odor from the wound/dressing, or calf pain - CONTACT YOUR SURGEON.                                                   FOLLOW-UP APPOINTMENTS Make sure you keep all of your appointments after your operation with your surgeon and caregivers. You should call the office at the above phone number and make an appointment for approximately two weeks after the date of your surgery or on the date instructed by your surgeon outlined in the "After Visit Summary".  RANGE OF MOTION AND STRENGTHENING EXERCISES  Rehabilitation of the knee is important following a knee injury or an operation. After just a few days of immobilization, the muscles of the thigh which control the knee become weakened and shrink (atrophy). Knee exercises are designed to build up  the tone and strength of the thigh muscles and to improve knee motion. Often times heat used for twenty to thirty minutes before working out will loosen up your tissues and help with improving the range of motion but do not use heat for the first two weeks following surgery. These exercises can be done on a training (exercise) mat, on the floor, on a table or on a bed. Use what ever works the best and is most comfortable for you Knee exercises include:  Leg Lifts - While your knee is still immobilized in a splint or cast, you can do straight leg raises. Lift the leg to 60 degrees, hold for 3 sec, and slowly lower the leg. Repeat 10-20 times 2-3 times daily. Perform this exercise against resistance later as your knee gets better.  Quad and Hamstring Sets - Tighten up the muscle on the front of the thigh (Quad) and hold for 5-10 sec. Repeat this 10-20 times hourly. Hamstring sets are done by pushing the foot backward against an object and holding for 5-10 sec. Repeat as with quad sets.  Leg Slides: Lying on your back, slowly slide your foot toward your buttocks, bending your knee up off the floor (only go as far as is comfortable). Then slowly slide your foot back down until your leg is flat on the floor again. Angel Wings: Lying on your back spread your legs to the side as far apart as you can without causing discomfort.  A rehabilitation program following serious knee injuries can speed recovery and prevent re-injury in the future due to weakened muscles. Contact your doctor or a physical therapist for more information on knee rehabilitation.   POST-OPERATIVE OPIOID TAPER INSTRUCTIONS: It is important to wean off of your opioid medication as soon as possible. If you do not need pain medication after your surgery it is ok to stop day one. Opioids include: Codeine, Hydrocodone(Norco, Vicodin), Oxycodone(Percocet, oxycontin) and hydromorphone amongst others.  Long term and even short term use of opiods can  cause: Increased pain response Dependence Constipation Depression Respiratory depression And more.  Withdrawal symptoms can include Flu like symptoms Nausea, vomiting And more Techniques to manage these symptoms Hydrate well Eat regular healthy meals Stay active Use relaxation techniques(deep breathing, meditating, yoga) Do Not substitute Alcohol to help with tapering If you have been on opioids for less than two weeks and do not have pain than it is ok to stop all together.    Plan to wean off of opioids This plan should start within one week post op of your joint replacement. Maintain the same interval or time between taking each dose and first decrease the dose.  Cut the total daily intake of opioids by one tablet each day Next start to increase the time between doses. The last dose that should be eliminated is the evening dose.   IF YOU ARE TRANSFERRED TO A SKILLED REHAB FACILITY If the patient is transferred to a skilled rehab facility following release from the hospital, a list of the current medications will be sent to the facility for the patient to continue.  When discharged from the skilled rehab facility, please have the facility set up the patient's Home Health Physical Therapy prior to being released. Also, the skilled facility will be responsible for providing the patient with their medications at time of release from the facility to include their pain medication, the muscle relaxants, and their blood thinner medication. If the patient is still at the rehab facility at time of the two week follow up appointment, the skilled rehab facility will also need to assist the patient in arranging follow up appointment in our office and any transportation needs.  MAKE SURE YOU:  Understand these instructions.  Get help right away if you are not doing well or get worse.   DENTAL ANTIBIOTICS:  In most cases prophylactic antibiotics for Dental procdeures after total joint surgery are  not necessary.  Exceptions are as follows:  1. History of prior total joint infection  2. Severely immunocompromised (Organ Transplant, cancer chemotherapy, Rheumatoid biologic meds such as Humera)  3. Poorly controlled diabetes (A1C &gt; 8.0, blood glucose over 200)  If you have one of these conditions, contact your surgeon for an antibiotic prescription, prior to your dental procedure.    Pick up stool softner and laxative for home use following surgery while on pain medications. Do not submerge incision under water. Please use good hand washing techniques while changing dressing each day. May shower starting three days after surgery. Please use a clean towel to pat the incision dry following showers. Continue to use ice for pain and swelling after surgery. Do not use any lotions or creams on the incision until instructed by your surgeon.  

## 2022-11-28 NOTE — Progress Notes (Signed)
Orthopedic Tech Progress Note Patient Details:  [name] [birth year] [id number]  CPM Right Knee CPM Right Knee: On Right Knee Flexion (Degrees): 40 Right Knee Extension (Degrees): 10  Post Interventions Patient Tolerated: Well  Darleen Crocker 11/28/2022, 9:15 AM

## 2022-11-28 NOTE — Progress Notes (Signed)
Attempted report x1. 

## 2022-11-28 NOTE — Interval H&P Note (Signed)
History and Physical Interval Note:  11/28/2022 6:30 AM  [name]  has presented today for surgery, with the diagnosis of right knee osteoarthritis.  The various methods of treatment have been discussed with the patient and family. After consideration of risks, benefits and other options for treatment, the patient has consented to  Procedure(s): TOTAL KNEE ARTHROPLASTY (Right) as a surgical intervention.  The patient's history has been reviewed, patient examined, no change in status, stable for surgery.  I have reviewed the patient's chart and labs.  Questions were answered to the patient's satisfaction.     Homero Fellers [name]

## 2022-11-28 NOTE — Anesthesia Procedure Notes (Signed)
Spinal  Patient location during procedure: OR Start time: 11/28/2022 7:15 AM End time: 11/28/2022 7:19 AM Reason for block: surgical anesthesia Staffing Performed: anesthesiologist  Anesthesiologist: Gaynelle Adu, MD Performed by: Gaynelle Adu, MD Authorized by: Gaynelle Adu, MD   Preanesthetic Checklist Completed: patient identified, IV checked, risks and benefits discussed, surgical consent, monitors and equipment checked, pre-op evaluation and timeout performed Spinal Block Patient position: sitting Prep: DuraPrep Patient monitoring: cardiac monitor, continuous pulse ox and blood pressure Approach: midline Location: L3-4 Injection technique: single-shot Needle Needle type: Pencan  Needle gauge: 24 G Needle length: 9 cm Assessment Sensory level: T8 Events: CSF return Additional Notes Functioning IV was confirmed and monitors were applied. Sterile prep and drape, including hand hygiene and sterile gloves were used. The patient was positioned and the spine was prepped. The skin was anesthetized with lidocaine.  Free flow of clear CSF was obtained prior to injecting local anesthetic into the CSF.  The spinal needle aspirated freely following injection.  The needle was carefully withdrawn.  The patient tolerated the procedure well.

## 2022-11-28 NOTE — Anesthesia Procedure Notes (Signed)
Anesthesia Regional Block: Adductor canal block   Pre-Anesthetic Checklist: , timeout performed,  Correct Patient, Correct Site, Correct Laterality,  Correct Procedure, Correct Position, site marked,  Risks and benefits discussed,  Pre-op evaluation,  At surgeon's request and post-op pain management  Laterality: Right  Prep: Maximum Sterile Barrier Precautions used, chloraprep       Needles:  Injection technique: Single-shot  Needle Type: Echogenic Stimulator Needle     Needle Length: 9cm  Needle Gauge: 21     Additional Needles:   Procedures:,,,, ultrasound used (permanent image in chart),,    Narrative:  Start time: 11/28/2022 6:47 AM End time: 11/28/2022 6:57 AM Injection made incrementally with aspirations every 5 mL.  Performed by: Personally  Anesthesiologist: Gaynelle Adu, MD  Additional Notes:

## 2022-11-28 NOTE — Evaluation (Signed)
Physical Therapy Evaluation Patient Details Name: [name] MRN: [id number] DOB: 21-Mar-1956 Today's Date: 11/28/2022  History of Present Illness  67 yo female presents to therapy s/p R TKA on 11/28/2022 due to failure of conservative measures. Pt PMH includes but is not limited to: GI bleed, HTN, HLD, DM II, anemia and R THA, AA (2022).  Clinical Impression    [name] is a 67 y.o. female POD 0 s/p R TKA. Patient reports mod I with SPC for mobility at baseline. Patient is now limited by functional impairments (see PT problem list below) and requires min guard for bed mobility and min guard and cues for transfers. Patient was able to ambulate 25 feet with RW and min guard level of assist. Patient instructed in exercise to facilitate ROM and circulation to manage edema. Pt c/o R knee/posterior LE pain 9/10 at time of eval and c/o discomfort at IV site with gait tasks. Patient will benefit from continued skilled PT interventions to address impairments and progress towards PLOF. Acute PT will follow to progress mobility and stair training in preparation for safe discharge home with family support and OPPT.      Recommendations for follow up therapy are one component of a multi-disciplinary discharge planning process, led by the attending physician.  Recommendations may be updated based on patient status, additional functional criteria and insurance authorization.  Follow Up Recommendations       Assistance Recommended at Discharge Intermittent Supervision/Assistance  Patient can return home with the following  A little help with walking and/or transfers;A little help with bathing/dressing/bathroom;Assistance with cooking/housework;Assist for transportation;Help with stairs or ramp for entrance    Equipment Recommendations None recommended by PT (pt reports DME in home setting)  Recommendations for Other Services       Functional Status Assessment Patient has had a recent decline in  their functional status and demonstrates the ability to make significant improvements in function in a reasonable and predictable amount of time.     Precautions / Restrictions Precautions Precautions: Knee;Fall Restrictions Weight Bearing Restrictions: No      Mobility  Bed Mobility Overal bed mobility: Needs Assistance Bed Mobility: Supine to Sit     Supine to sit: HOB elevated, Min guard (use of bed rail)     General bed mobility comments: min cues    Transfers Overall transfer level: Needs assistance Equipment used: Rolling walker (2 wheels) Transfers: Sit to/from Stand Sit to Stand: Min guard           General transfer comment: cues for proper UE and AD placement    Ambulation/Gait Ambulation/Gait assistance: Min guard Gait Distance (Feet): 25 Feet Assistive device: Rolling walker (2 wheels) Gait Pattern/deviations: Step-to pattern, Antalgic (reliance on B UE support) Gait velocity: decreased        Stairs            Wheelchair Mobility    Modified Rankin (Stroke Patients Only)       Balance Overall balance assessment: Needs assistance Sitting-balance support: Bilateral upper extremity supported, Feet supported Sitting balance-Leahy Scale: Fair     Standing balance support: Bilateral upper extremity supported, During functional activity, Reliant on assistive device for balance Standing balance-Leahy Scale: Poor                               Pertinent Vitals/Pain Pain Assessment Pain Assessment: 0-10 Pain Score: 9  Pain Location: R LE Pain Descriptors /  Indicators: Constant, Crying, Discomfort, Aching, Burning, Operative site guarding, Restless Pain Intervention(s): Limited activity within patient's tolerance, Monitored during session, Premedicated before session, Repositioned, Ice applied, Patient requesting pain meds-RN notified (PT evaluation ~ 40 mins s/p administration of pain medication with minimal resolve. At rest pt  reported 7/10 pain and esclated with mobility)    Home Living Family/patient expects to be discharged to:: Private residence Living Arrangements: Spouse/significant other Available Help at Discharge: Family Type of Home: House Home Access: Stairs to enter Entrance Stairs-Rails: None Entrance Stairs-Number of Steps: 3   Home Layout: Laundry or work area in basement;Two level;Able to live on main level with bedroom/bathroom Home Equipment: Rolling Walker (2 wheels);Cane - single point;Shower seat      Prior Function Prior Level of Function : Independent/Modified Independent;Driving             Mobility Comments: Mod I for ADL tasks, some assist for washing feet, use of SPC for mobility tasks       Hand Dominance        Extremity/Trunk Assessment        Lower Extremity Assessment Lower Extremity Assessment: RLE deficits/detail RLE Deficits / Details: ankle DF/PF 5/5; SLR with AA RLE Sensation: WNL    Cervical / Trunk Assessment Cervical / Trunk Assessment:  (wfl)  Communication   Communication: No difficulties  Cognition Arousal/Alertness: Awake/alert Behavior During Therapy: WFL for tasks assessed/performed Overall Cognitive Status: Within Functional Limits for tasks assessed                                          General Comments      Exercises Total Joint Exercises Ankle Circles/Pumps: AROM, Both, 20 reps   Assessment/Plan    PT Assessment Patient needs continued PT services  PT Problem List Decreased strength;Decreased range of motion;Decreased activity tolerance;Decreased balance;Decreased mobility;Decreased coordination;Pain       PT Treatment Interventions DME instruction;Gait training;Stair training;Functional mobility training;Therapeutic activities;Therapeutic exercise;Balance training;Neuromuscular re-education;Patient/family education;Modalities    PT Goals (Current goals can be found in the Care Plan section)  Acute  Rehab PT Goals Patient Stated Goal: to get back to walking and gardening PT Goal Formulation: With patient Time For Goal Achievement: 12/12/22 Potential to Achieve Goals: Good    Frequency 7X/week     Co-evaluation               AM-PAC PT "6 Clicks" Mobility  Outcome Measure Help needed turning from your back to your side while in a flat bed without using bedrails?: A Little Help needed moving from lying on your back to sitting on the side of a flat bed without using bedrails?: A Little Help needed moving to and from a bed to a chair (including a wheelchair)?: A Little Help needed standing up from a chair using your arms (e.g., wheelchair or bedside chair)?: A Little Help needed to walk in hospital room?: A Little Help needed climbing 3-5 steps with a railing? : A Lot 6 Click Score: 17    End of Session Equipment Utilized During Treatment: Gait belt Activity Tolerance: Patient limited by pain Patient left: in chair;with call bell/phone within reach;with nursing/sitter in room;with family/visitor present Nurse Communication: Mobility status;Patient requests pain meds PT Visit Diagnosis: Unsteadiness on feet (R26.81);Other abnormalities of gait and mobility (R26.89);Muscle weakness (generalized) (M62.81);Pain;Difficulty in walking, not elsewhere classified (R26.2) Pain - Right/Left: Right Pain - part of  body: Leg;Knee    Time: 1430-1501 PT Time Calculation (min) (ACUTE ONLY): 31 min   Charges:   PT Evaluation $PT Eval Low Complexity: 1 Low PT Treatments $Gait Training: 8-22 mins        Johnny Bridge, PT Acute Rehab   Jacqualyn Posey 11/28/2022, 3:10 PM

## 2022-11-28 NOTE — Progress Notes (Signed)
Orthopedic Tech Progress Note Patient Details:  [name] 09-12-1955 [id number]  CPM Right Knee CPM Right Knee: Off Right Knee Flexion (Degrees): 40 Right Knee Extension (Degrees): 10  Post Interventions Patient Tolerated: Well  Darleen Crocker 11/28/2022, 1:28 PM

## 2022-11-28 NOTE — Transfer of Care (Signed)
Immediate Anesthesia Transfer of Care Note  Patient: [name]  Procedure(s) Performed: TOTAL KNEE ARTHROPLASTY (Right: Knee)  Patient Location: PACU  Anesthesia Type:Spinal  Level of Consciousness: drowsy and patient cooperative  Airway & Oxygen Therapy: Patient Spontanous Breathing and Patient connected to face mask oxygen  Post-op Assessment: Report given to RN and Post -op Vital signs reviewed and stable  Post vital signs: Reviewed and stable  Last Vitals:  Vitals Value Taken Time  BP 109/58 11/28/22 0848  Temp 97.8 11/28/22  0848  Pulse 64 11/28/22 0849  Resp 13 11/28/22 0849  SpO2 100 % 11/28/22 0849  Vitals shown include unvalidated device data.  Last Pain:  Vitals:   11/28/22 0549  TempSrc:   PainSc: 6          Complications: No notable events documented.

## 2022-11-28 NOTE — Op Note (Signed)
OPERATIVE REPORT-TOTAL KNEE ARTHROPLASTY   Pre-operative diagnosis- Osteoarthritis  Right knee(s)  Post-operative diagnosis- Osteoarthritis Right knee(s)  Procedure-  Right  Total Knee Arthroplasty  Surgeon- Gus Rankin. Carlson Belland, MD  Assistant- Sheppard Penton Haus, PA-C   Anesthesia-   Adductor canal block and spinal  EBL-50 mL   Drains None  Tourniquet time-  Total Tourniquet Time Documented: Thigh (Right) - 38 minutes Total: Thigh (Right) - 38 minutes     Complications- None  Condition-PACU - hemodynamically stable.   Brief Clinical Note  [name] is a 67 y.o. year old female with end stage OA of her right knee with progressively worsening pain and dysfunction. She has constant pain, with activity and at rest and significant functional deficits with difficulties even with ADLs. She has had extensive non-op management including analgesics, injections of cortisone and viscosupplements, and home exercise program, but remains in significant pain with significant dysfunction.Radiographs show bone on bone arthritis medial and patellofemoral. She presents now for right Total Knee Arthroplasty.     Procedure in detail---   The patient is brought into the operating room and positioned supine on the operating table. After successful administration of  Adductor canal block and spinal,   a tourniquet is placed high on the  Right thigh(s) and the lower extremity is prepped and draped in the usual sterile fashion. Time out is performed by the operating team and then the  Right lower extremity is wrapped in Esmarch, knee flexed and the tourniquet inflated to 300 mmHg.       A midline incision is made with a ten blade through the subcutaneous tissue to the level of the extensor mechanism. A fresh blade is used to make a medial parapatellar arthrotomy. Soft tissue over the proximal medial tibia is subperiosteally elevated to the joint line with a knife and into the semimembranosus bursa with a Cobb  elevator. Soft tissue over the proximal lateral tibia is elevated with attention being paid to avoiding the patellar tendon on the tibial tubercle. The patella is everted, knee flexed 90 degrees and the ACL and PCL are removed. Findings are bone on bone medial and patellofemoral with large global osteophytes        The drill is used to create a starting hole in the distal femur and the canal is thoroughly irrigated with sterile saline to remove the fatty contents. The 5 degree Right  valgus alignment guide is placed into the femoral canal and the distal femoral cutting block is pinned to remove 9 mm off the distal femur. Resection is made with an oscillating saw.      The tibia is subluxed forward and the menisci are removed. The extramedullary alignment guide is placed referencing proximally at the medial aspect of the tibial tubercle and distally along the second metatarsal axis and tibial crest. The block is pinned to remove 2mm off the more deficient medial  side. Resection is made with an oscillating saw. Size 5is the most appropriate size for the tibia and the proximal tibia is prepared with the modular drill and keel punch for that size.      The femoral sizing guide is placed and size 6 is most appropriate. Rotation is marked off the epicondylar axis and confirmed by creating a rectangular flexion gap at 90 degrees. The size 6 cutting block is pinned in this rotation and the anterior, posterior and chamfer cuts are made with the oscillating saw. The intercondylar block is then placed and that cut is  made.      Trial size 5 tibial component, trial size 6 posterior stabilized femur and a 7  mm posterior stabilized rotating platform insert trial is placed. Full extension is achieved with excellent varus/valgus and anterior/posterior balance throughout full range of motion. The patella is everted and thickness measured to be 22  mm. Free hand resection is taken to 12 mm, a 35 template is placed, lug holes  are drilled, trial patella is placed, and it tracks normally. Osteophytes are removed off the posterior femur with the trial in place. All trials are removed and the cut bone surfaces prepared with pulsatile lavage. Cement is mixed and once ready for implantation, the size 5 tibial implant, size  6 posterior stabilized femoral component, and the size 35 patella are cemented in place and the patella is held with the clamp. The trial insert is placed and the knee held in full extension. The Exparel (20 ml mixed with 60 ml saline) is injected into the extensor mechanism, posterior capsule, medial and lateral gutters and subcutaneous tissues.  All extruded cement is removed and once the cement is hard the permanent 7 mm posterior stabilized rotating platform insert is placed into the tibial tray.      The wound is copiously irrigated with saline solution and the extensor mechanism closed with # 0 Stratofix suture. The tourniquet is released for a total tourniquet time of 38  minutes. Flexion against gravity is 140 degrees and the patella tracks normally. Subcutaneous tissue is closed with 2.0 vicryl and subcuticular with running 4.0 Monocryl. The incision is cleaned and dried and steri-strips and a bulky sterile dressing are applied. The limb is placed into a knee immobilizer and the patient is awakened and transported to recovery in stable condition.      Please note that a surgical assistant was a medical necessity for this procedure in order to perform it in a safe and expeditious manner. Surgical assistant was necessary to retract the ligaments and vital neurovascular structures to prevent injury to them and also necessary for proper positioning of the limb to allow for anatomic placement of the prosthesis.   Gus Rankin Macauley Mossberg, MD    11/28/2022, 8:31 AM

## 2022-11-28 NOTE — Progress Notes (Signed)
Attempted report x 2 

## 2022-11-29 ENCOUNTER — Encounter (HOSPITAL_COMMUNITY): Payer: Self-pay | Admitting: Orthopedic Surgery

## 2022-11-29 DIAGNOSIS — M1711 Unilateral primary osteoarthritis, right knee: Secondary | ICD-10-CM | POA: Diagnosis not present

## 2022-11-29 LAB — CBC
HCT: 39.9 % (ref 36.0–46.0)
Hemoglobin: 12.9 g/dL (ref 12.0–15.0)
MCH: 29.9 pg (ref 26.0–34.0)
MCHC: 32.3 g/dL (ref 30.0–36.0)
MCV: 92.6 fL (ref 80.0–100.0)
Platelets: 212 10*3/uL (ref 150–400)
RBC: 4.31 MIL/uL (ref 3.87–5.11)
RDW: 13.2 % (ref 11.5–15.5)
WBC: 16 10*3/uL — ABNORMAL HIGH (ref 4.0–10.5)
nRBC: 0 % (ref 0.0–0.2)

## 2022-11-29 LAB — GLUCOSE, CAPILLARY
Glucose-Capillary: 176 mg/dL — ABNORMAL HIGH (ref 70–99)
Glucose-Capillary: 204 mg/dL — ABNORMAL HIGH (ref 70–99)

## 2022-11-29 LAB — BASIC METABOLIC PANEL
Anion gap: 7 (ref 5–15)
BUN: 12 mg/dL (ref 8–23)
CO2: 29 mmol/L (ref 22–32)
Calcium: 8.7 mg/dL — ABNORMAL LOW (ref 8.9–10.3)
Chloride: 102 mmol/L (ref 98–111)
Creatinine, Ser: 0.73 mg/dL (ref 0.44–1.00)
GFR, Estimated: >60 mL/min (ref >60)
Glucose, Bld: 183 mg/dL — ABNORMAL HIGH (ref 70–99)
Potassium: 3.9 mmol/L (ref 3.5–5.1)
Sodium: 138 mmol/L (ref 135–145)

## 2022-11-29 MED ORDER — METHOCARBAMOL 500 MG PO TABS: 500.0000 mg | ORAL_TABLET | Freq: Four times a day (QID) | ORAL | 0 refills | Status: DC | PRN

## 2022-11-29 MED ORDER — ONDANSETRON HCL 4 MG PO TABS: 4.0000 mg | ORAL_TABLET | Freq: Four times a day (QID) | ORAL | 0 refills | Status: DC | PRN

## 2022-11-29 MED ORDER — OXYCODONE HCL 5 MG PO TABS: 5.0000 mg | ORAL_TABLET | Freq: Four times a day (QID) | ORAL | 0 refills | Status: DC | PRN

## 2022-11-29 MED ORDER — LORATADINE 10 MG PO TABS: 10.0000 mg | ORAL_TABLET | Freq: Every day | ORAL | Status: DC

## 2022-11-29 MED ORDER — ASPIRIN 81 MG PO CHEW: 81.0000 mg | CHEWABLE_TABLET | Freq: Two times a day (BID) | ORAL | 0 refills | Status: AC

## 2022-11-29 NOTE — Care Management Obs Status (Signed)
MEDICARE OBSERVATION STATUS NOTIFICATION   Patient Details  Name: EMALEY BILLE MRN: [id number] Date of Birth: [birth year]   Medicare Observation Status Notification Given:  Hart Robinsons, LCSW 11/29/2022, 11:27 AM

## 2022-11-29 NOTE — Progress Notes (Signed)
   Subjective: 1 Day Post-Op Procedure(s) (LRB): TOTAL KNEE ARTHROPLASTY (Right) Patient reports pain as mild.   Patient seen in rounds by Dr. Lequita Halt. Patient is well, and has had no acute complaints or problems No issues overnight. Denies chest pain, SOB, or calf pain. Foley catheter removed this AM.  We will continue therapy today, ambulated 25' yesterday.   Objective: Vital signs in last 24 hours: Temp:  [97.6 F (36.4 C)-98.7 F (37.1 C)] 98.7 F (37.1 C) (06/18 0549) Pulse Rate:  [57-75] 72 (06/18 0549) Resp:  [12-18] 18 (06/18 0549) BP: (109-144)/(55-77) 141/72 (06/18 0549) SpO2:  [95 %-100 %] 97 % (06/18 0549)  Intake/Output from previous day:  Intake/Output Summary (Last 24 hours) at 11/29/2022 0755 Last data filed at 11/29/2022 0741 Gross per 24 hour  Intake 3881.4 ml  Output 5400 ml  Net -1518.6 ml     Intake/Output this shift: Total I/O In: -  Out: 100 [Urine:100]  Labs: Recent Labs    11/29/22 0321  HGB 12.9   Recent Labs    11/29/22 0321  WBC 16.0*  RBC 4.31  HCT 39.9  PLT 212   Recent Labs    11/29/22 0321  NA 138  K 3.9  CL 102  CO2 29  BUN 12  CREATININE 0.73  GLUCOSE 183*  CALCIUM 8.7*   No results for input(s): "LABPT", "INR" in the last 72 hours.  Exam: General - Patient is Alert and Oriented Extremity - Neurologically intact Neurovascular intact Sensation intact distally Dorsiflexion/Plantar flexion intact Dressing - dressing C/D/I Motor Function - intact, moving foot and toes well on exam.   Past Medical History:  Diagnosis Date   Anxiety    Asthma    Chronic pain    Depression    Diabetes mellitus without complication (HCC)    Fibromyalgia    GERD (gastroesophageal reflux disease)    Headache    migraines   Hyperlipidemia    Hypothyroidism    Osteoarthritis    PONV (postoperative nausea and vomiting)     Assessment/Plan: 1 Day Post-Op Procedure(s) (LRB): TOTAL KNEE ARTHROPLASTY (Right) Principal  Problem:   OA (osteoarthritis) of knee Active Problems:   Primary osteoarthritis of right knee  Estimated body mass index is 39.01 kg/m as calculated from the following:   Height as of this encounter: 5\' 3"  (1.6 m).   Weight as of this encounter: 99.9 kg. Advance diet Up with therapy D/C IV fluids   Patient's anticipated LOS is less than 2 midnights, meeting these requirements: - Lives within 1 hour of care - Has a competent adult at home to recover with post-op recover - NO history of  - Coronary Artery Disease  - Heart failure  - Heart attack  - Stroke  - DVT/VTE  - Respiratory Failure/COPD  - Renal failure  - Anemia  - Advanced Liver disease  DVT Prophylaxis - Aspirin Weight bearing as tolerated. Continue therapy.  Plan is to go Home after hospital stay. Plan for discharge later today if progresses with therapy and meeting goals. Scheduled for OPPT at [facility]). Follow-up in the office in 2 weeks.  The PDMP database was reviewed today prior to any opioid medications being prescribed to this patient.  Arther Abbott, PA-C Orthopedic Surgery [phone] 11/29/2022, 7:55 AM

## 2022-11-29 NOTE — Progress Notes (Signed)
Provided discharge education/instructions, all questions and concerns addressed. Pt not in any distress, discharged home with all of her belongings accompanied by her husband.

## 2022-11-29 NOTE — Plan of Care (Signed)

## 2022-11-29 NOTE — Progress Notes (Signed)
Physical Therapy Treatment Patient Details Name: [name] MRN: [id number] DOB: 09-Apr-1956 Today's Date: 11/29/2022   History of Present Illness 67 yo female presents to therapy s/p R TKA on 11/28/2022 due to failure of conservative measures. Pt PMH includes but is not limited to: GI bleed, HTN, HLD, DM II, anemia and R THA, AA (2022).    PT Comments    POD # 1 pm session Spouse present for Family Training General Comments: AxO x 3 pleasant with issues of "light" nausea this morning but "better" than yesterday.  Lives home with Spouse who has had two knee replacements.   Had Spouse assist pt OOB to amb to bathroom.  Spouse did well using safety belt.  He also assisted with changing her pullup.  Had Spouse "hands on" amb pt in hallway as well as practiced stairs. Addressed all mobility questions, discussed appropriate activity, educated on use of ICE.  Pt ready for D/C to home.   Recommendations for follow up therapy are one component of a multi-disciplinary discharge planning process, led by the attending physician.  Recommendations may be updated based on patient status, additional functional criteria and insurance authorization.  Follow Up Recommendations       Assistance Recommended at Discharge Intermittent Supervision/Assistance  Patient can return home with the following A little help with walking and/or transfers;A little help with bathing/dressing/bathroom;Assistance with cooking/housework;Assist for transportation;Help with stairs or ramp for entrance   Equipment Recommendations  None recommended by PT    Recommendations for Other Services       Precautions / Restrictions Precautions Precautions: Knee;Fall Precaution Comments: no pillow under knee Restrictions Weight Bearing Restrictions: No RLE Weight Bearing: Weight bearing as tolerated     Mobility  Bed Mobility Overal bed mobility: Needs Assistance Bed Mobility: Supine to Sit     Supine to sit: HOB  elevated, Min guard     General bed mobility comments: using belt to guide LE as well as increased time    Transfers Overall transfer level: Needs assistance Equipment used: Rolling walker (2 wheels) Transfers: Sit to/from Stand Sit to Stand: Min guard, Supervision           General transfer comment: cues for proper UE and AD placement as well as increased time    Ambulation/Gait Ambulation/Gait assistance: Supervision, Min guard Gait Distance (Feet): 30 Feet (15 feet x 2 needing a seated rest break) Assistive device: Rolling walker (2 wheels) Gait Pattern/deviations: Step-to pattern, Antalgic Gait velocity: decreased     General Gait Details: limited amb distance of 15 feet x 2 needing a seated rest break to complete a total of 30 feet.  Slow.  Pain 8/10.  Feeling "hot" towards the end.   Stairs Stairs: Yes Stairs assistance: Min assist Stair Management: No rails, Step to pattern, Forwards, With walker Number of Stairs: 2 General stair comments: with Spouse "hands on" supporting walker due to no rails   Wheelchair Mobility    Modified Rankin (Stroke Patients Only)       Balance                                            Cognition Arousal/Alertness: Awake/alert Behavior During Therapy: WFL for tasks assessed/performed Overall Cognitive Status: Within Functional Limits for tasks assessed  General Comments: AxO x 3 pleasant with issues of "light" nausea this morning but "better" than yesterday.  Lives home with Spouse who has had two knee replacements.  "He's doing great", stated pt.        Exercises      General Comments        Pertinent Vitals/Pain Pain Assessment Pain Assessment: 0-10 Pain Score: 8  Pain Location: R LE Pain Descriptors / Indicators: Constant, Burning, Operative site guarding Pain Intervention(s): Monitored during session, Repositioned    Home Living                           Prior Function            PT Goals (current goals can now be found in the care plan section) Progress towards PT goals: Progressing toward goals    Frequency    7X/week      PT Plan Current plan remains appropriate    Co-evaluation              AM-PAC PT "6 Clicks" Mobility   Outcome Measure  Help needed turning from your back to your side while in a flat bed without using bedrails?: A Little Help needed moving from lying on your back to sitting on the side of a flat bed without using bedrails?: A Little Help needed moving to and from a bed to a chair (including a wheelchair)?: A Little Help needed standing up from a chair using your arms (e.g., wheelchair or bedside chair)?: A Little Help needed to walk in hospital room?: A Little Help needed climbing 3-5 steps with a railing? : A Lot 6 Click Score: 17    End of Session Equipment Utilized During Treatment: Gait belt Activity Tolerance: Patient limited by pain;Patient limited by fatigue Patient left: in chair;with call bell/phone within reach Nurse Communication: Mobility status PT Visit Diagnosis: Unsteadiness on feet (R26.81);Other abnormalities of gait and mobility (R26.89);Muscle weakness (generalized) (M62.81);Pain;Difficulty in walking, not elsewhere classified (R26.2) Pain - Right/Left: Right Pain - part of body: Leg;Knee     Time: 1435-1500 PT Time Calculation (min) (ACUTE ONLY): 25 min  Charges:  $Gait Training: 8-22 mins $Therapeutic Activity: 8-22 mins                     Felecia Shelling  PTA Acute  Rehabilitation Services Office M-F          [phone]

## 2022-11-29 NOTE — Progress Notes (Signed)
Physical Therapy Treatment Patient Details Name: [name] MRN: [id number] DOB: Jul 23, 1955 Today's Date: 11/29/2022   History of Present Illness 67 yo female presents to therapy s/p R TKA on 11/28/2022 due to failure of conservative measures. Pt PMH includes but is not limited to: GI bleed, HTN, HLD, DM II, anemia and R THA, AA (2022).    PT Comments    POD # 1 am session General Comments: AxO x 3 pleasant with issues of "light" nausea this morning but "better" than yesterday.  Lives home with Spouse who has had two knee replacements.  "He's doing great", stated pt. Assisted OOB to amb required increased time and effort.  General bed mobility comments: using belt to guide LE as well as increased time.  General transfer comment: cues for proper UE and AD placement as well as increased time.  General Gait Details: limited amb distance of 15 feet x 2 needing a seated rest break to complete a total of 30 feet.  Slow.  Pain 8/10.  Feeling "hot" towards the end.  Then returned to room to perform some TE's following HEP handout.  Instructed on proper tech, freq as well as use of ICE.   Pt will need a second PT session with Spouse to practice stairs before D/C to home later today.     Recommendations for follow up therapy are one component of a multi-disciplinary discharge planning process, led by the attending physician.  Recommendations may be updated based on patient status, additional functional criteria and insurance authorization.  Follow Up Recommendations       Assistance Recommended at Discharge Intermittent Supervision/Assistance  Patient can return home with the following A little help with walking and/or transfers;A little help with bathing/dressing/bathroom;Assistance with cooking/housework;Assist for transportation;Help with stairs or ramp for entrance   Equipment Recommendations  None recommended by PT    Recommendations for Other Services       Precautions / Restrictions  Precautions Precautions: Knee;Fall Precaution Comments: no pillow under knee Restrictions Weight Bearing Restrictions: No RLE Weight Bearing: Weight bearing as tolerated     Mobility  Bed Mobility Overal bed mobility: Needs Assistance Bed Mobility: Supine to Sit     Supine to sit: HOB elevated, Min guard     General bed mobility comments: using belt to guide LE as well as increased time    Transfers Overall transfer level: Needs assistance Equipment used: Rolling walker (2 wheels) Transfers: Sit to/from Stand Sit to Stand: Min guard, Supervision           General transfer comment: cues for proper UE and AD placement as well as increased time    Ambulation/Gait Ambulation/Gait assistance: Supervision, Min guard Gait Distance (Feet): 30 Feet (15 feet x 2 needing a seated rest break) Assistive device: Rolling walker (2 wheels) Gait Pattern/deviations: Step-to pattern, Antalgic Gait velocity: decreased     General Gait Details: limited amb distance of 15 feet x 2 needing a seated rest break to complete a total of 30 feet.  Slow.  Pain 8/10.  Feeling "hot" towards the end.   Stairs             Wheelchair Mobility    Modified Rankin (Stroke Patients Only)       Balance  Cognition Arousal/Alertness: Awake/alert Behavior During Therapy: WFL for tasks assessed/performed Overall Cognitive Status: Within Functional Limits for tasks assessed                                 General Comments: AxO x 3 pleasant with issues of "light" nausea this morning but "better" than yesterday.  Lives home with Spouse who has had two knee replacements.  "He's doing great", stated pt.        Exercises  Total Knee Replacement TE's following HEP handout 10 reps B LE ankle pumps 05 reps towel squeezes 05 reps knee presses 05 reps heel slides   Educated on use of gait belt to assist with  TE's Followed by ICE     General Comments        Pertinent Vitals/Pain Pain Assessment Pain Assessment: 0-10 Pain Score: 8  Pain Location: R LE Pain Descriptors / Indicators: Constant, Burning, Operative site guarding Pain Intervention(s): Monitored during session, Repositioned    Home Living                          Prior Function            PT Goals (current goals can now be found in the care plan section) Progress towards PT goals: Progressing toward goals    Frequency    7X/week      PT Plan Current plan remains appropriate    Co-evaluation              AM-PAC PT "6 Clicks" Mobility   Outcome Measure  Help needed turning from your back to your side while in a flat bed without using bedrails?: A Little Help needed moving from lying on your back to sitting on the side of a flat bed without using bedrails?: A Little Help needed moving to and from a bed to a chair (including a wheelchair)?: A Little Help needed standing up from a chair using your arms (e.g., wheelchair or bedside chair)?: A Little Help needed to walk in hospital room?: A Little Help needed climbing 3-5 steps with a railing? : A Lot 6 Click Score: 17    End of Session Equipment Utilized During Treatment: Gait belt Activity Tolerance: Patient limited by pain;Patient limited by fatigue Patient left: in chair;with call bell/phone within reach Nurse Communication: Mobility status PT Visit Diagnosis: Unsteadiness on feet (R26.81);Other abnormalities of gait and mobility (R26.89);Muscle weakness (generalized) (M62.81);Pain;Difficulty in walking, not elsewhere classified (R26.2) Pain - Right/Left: Right Pain - part of body: Leg;Knee     Time: 1020-1050 PT Time Calculation (min) (ACUTE ONLY): 30 min  Charges:  $Gait Training: 8-22 mins $Therapeutic Exercise: 8-22 mins                     Felecia Shelling  PTA Acute  Rehabilitation Services Office M-F          [phone]

## 2022-11-29 NOTE — TOC Transition Note (Signed)
Transition of Care [facility]) - CM/SW Discharge Note   Patient Details  Name: [name] MRN: [id number] Date of Birth: March 29, 1956  [facility]) CM/SW Contact:  Amada Jupiter, LCSW Phone Number: 11/29/2022, 9:53 AM   Clinical Narrative:    Met with pt who confirms she has needed DME in the home.  OPPT already arranged with [facility] ([place]).  No TOC needs.   Final next level of care: OP Rehab Barriers to Discharge: No Barriers Identified   Patient Goals and CMS Choice      Discharge Placement                         Discharge Plan and Services Additional resources added to the After Visit Summary for                  DME Arranged: N/A DME Agency: NA                  Social Determinants of Health (SDOH) Interventions SDOH Screenings   Food Insecurity: No Food Insecurity (11/28/2022)  Housing: Low Risk  (11/28/2022)  Transportation Needs: No Transportation Needs (11/28/2022)  Utilities: Not At Risk (11/28/2022)  Tobacco Use: Medium Risk (11/28/2022)     Readmission Risk Interventions     No data to display

## 2022-11-30 NOTE — Discharge Summary (Signed)
Patient ID: [name] MRN: [id number] DOB/AGE: [birth year] 67 y.o.  Admit date: 11/28/2022 Discharge date: 11/29/2022  Admission Diagnoses:  Principal Problem:   OA (osteoarthritis) of knee Active Problems:   Primary osteoarthritis of right knee   Discharge Diagnoses:  Same  Past Medical History:  Diagnosis Date   Anxiety    Asthma    Chronic pain    Depression    Diabetes mellitus without complication (HCC)    Fibromyalgia    GERD (gastroesophageal reflux disease)    Headache    migraines   Hyperlipidemia    Hypothyroidism    Osteoarthritis    PONV (postoperative nausea and vomiting)     Surgeries: Procedure(s): TOTAL KNEE ARTHROPLASTY on 11/28/2022   Consultants:   Discharged Condition: Improved  Hospital Course: EMELENE HEPPLER is an 67 y.o. female who was admitted 11/28/2022 for operative treatment ofOA (osteoarthritis) of knee. Patient has severe unremitting pain that affects sleep, daily activities, and work/hobbies. After pre-op clearance the patient was taken to the operating room on 11/28/2022 and underwent  Procedure(s): TOTAL KNEE ARTHROPLASTY.    Patient was given perioperative antibiotics:  Anti-infectives (From admission, onward)    Start     Dose/Rate Route Frequency Ordered Stop   11/28/22 1830  vancomycin (VANCOCIN) IVPB 1000 mg/200 mL premix        1,000 mg 200 mL/hr over 60 Minutes Intravenous Every 12 hours 11/28/22 1050 11/29/22 1155   11/28/22 1130  metroNIDAZOLE (FLAGYL) tablet 500 mg  Status:  Discontinued        500 mg Oral Every 12 hours 11/28/22 1040 11/28/22 1048   11/28/22 1130  ceFAZolin (ANCEF) IVPB 2g/100 mL premix  Status:  Discontinued        2 g 200 mL/hr over 30 Minutes Intravenous Every 6 hours 11/28/22 1040 11/28/22 1049   11/28/22 0600  vancomycin (VANCOCIN) IVPB 1000 mg/200 mL premix        1,000 mg 200 mL/hr over 60 Minutes Intravenous On call to O.R. 11/28/22 0540 11/29/22 1155        Patient was given sequential  compression devices, early ambulation, and chemoprophylaxis to prevent DVT.  Patient benefited maximally from hospital stay and there were no complications.    Recent vital signs: Patient Vitals for the past 24 hrs:  BP Temp Temp src Pulse Resp SpO2  11/29/22 1245 (!) 145/70 98.3 F (36.8 C) Axillary 73 17 95 %  11/29/22 0905 135/73 98.8 F (37.1 C) Oral 72 16 96 %     Recent laboratory studies:  Recent Labs    11/29/22 0321  WBC 16.0*  HGB 12.9  HCT 39.9  PLT 212  NA 138  K 3.9  CL 102  CO2 29  BUN 12  CREATININE 0.73  GLUCOSE 183*  CALCIUM 8.7*     Discharge Medications:   Allergies as of 11/29/2022       Reactions   Hydromorphone Other (See Comments), Shortness Of Breath   Respiratory arrest per patient Other reaction(s): coded with fast admin Note: Imported from external source.   Penicillins Anaphylaxis, Other (See Comments)   Erythromycin Base Nausea And Vomiting   Ibuprofen Other (See Comments)   Per pt, it makes her bleed   Liraglutide    Meperidine Hives   Meperidine Hcl Other (See Comments)   Nsaids Other (See Comments)   GI bleed        Medication List     STOP taking these medications  tiZANidine 4 MG capsule Commonly known as: ZANAFLEX   traZODone 100 MG tablet Commonly known as: DESYREL       TAKE these medications    albuterol 108 (90 Base) MCG/ACT inhaler Commonly known as: VENTOLIN HFA Inhale into the lungs. Notes to patient: Resume home regimen   aspirin 81 MG chewable tablet Chew 1 tablet (81 mg total) by mouth 2 (two) times daily for 20 days. Then take one 81 mg aspirin once a day for three weeks. Then discontinue aspirin.   buPROPion 150 MG 12 hr tablet Commonly known as: WELLBUTRIN SR Take 1 tablet (150 mg total) by mouth 2 (two) times daily.   busPIRone 10 MG tablet Commonly known as: BUSPAR Take 1.5 tablets (15 mg total) by mouth 2 (two) times daily.   cariprazine 3 MG capsule Commonly known as:  Vraylar Take 1 capsule (3 mg total) by mouth daily.   COLLAGEN PO Take 6,000 mg by mouth daily. Notes to patient: Resume home regimen   cyanocobalamin 2000 MCG tablet Take 2,000 mcg by mouth daily. Notes to patient: Resume home regimen   estradiol 0.5 MG tablet Commonly known as: ESTRACE Take 0.5 mg by mouth daily. Notes to patient: Resume home regimen   ferrous gluconate 324 MG tablet Commonly known as: FERGON Take 324 mg by mouth daily with breakfast. Notes to patient: Resume home regimen   fluconazole 150 MG tablet Commonly known as: DIFLUCAN Take 150 mg by mouth once a week. Notes to patient: Resume home regimen   FLUoxetine 40 MG capsule Commonly known as: PROzac Take 1 capsule (40 mg total) by mouth daily. Notes to patient: Resume home regimen   furosemide 20 MG tablet Commonly known as: LASIX Take 20 mg by mouth every Monday, Wednesday, and Friday. Notes to patient: Resume home regimen   gabapentin 600 MG tablet Commonly known as: NEURONTIN Take 600 mg by mouth in the morning, at noon, in the evening, and at bedtime.   insulin aspart 100 UNIT/ML injection Commonly known as: novoLOG Inject 5-8 Units into the skin 3 (three) times daily before meals.   Lantus SoloStar 100 UNIT/ML Solostar Pen Generic drug: insulin glargine Inject 44 Units into the skin daily. At hs Notes to patient: 06/18 bedtime   levocetirizine 5 MG tablet Commonly known as: XYZAL Take 5 mg by mouth every evening. Notes to patient: Resume home regimen   levothyroxine 175 MCG tablet Commonly known as: SYNTHROID Take 175 mcg by mouth daily before breakfast.   LORazepam 0.5 MG tablet Commonly known as: ATIVAN Take one to 2 tablets daily as needed for panic attacks. Notes to patient: Resume home regimen   lovastatin 40 MG tablet Commonly known as: MEVACOR Take 40 mg by mouth at bedtime. Notes to patient: Resume home regimen   meclizine 25 MG tablet Commonly known as:  ANTIVERT Take 25 mg by mouth 3 (three) times daily as needed for dizziness. Notes to patient: Resume home regimen   methocarbamol 500 MG tablet Commonly known as: ROBAXIN Take 1 tablet (500 mg total) by mouth every 6 (six) hours as needed for muscle spasms. Notes to patient: Last dose given 06/18 05:45am   montelukast 10 MG tablet Commonly known as: SINGULAIR Take 10 mg by mouth daily.   ondansetron 4 MG tablet Commonly known as: ZOFRAN Take 4 mg by mouth 3 (three) times daily as needed for refractory nausea / vomiting. What changed: Another medication with the same name was added. Make sure you understand how and  when to take each. Notes to patient: Last dose given 06/18 03:30am   ondansetron 4 MG tablet Commonly known as: ZOFRAN Take 1 tablet (4 mg total) by mouth every 6 (six) hours as needed for nausea. What changed: You were already taking a medication with the same name, and this prescription was added. Make sure you understand how and when to take each.   oxybutynin 5 MG tablet Commonly known as: DITROPAN Take 5 mg by mouth daily. Notes to patient: Resume home regimen   oxyCODONE 15 MG immediate release tablet Commonly known as: ROXICODONE Take 15 mg by mouth. What changed: Another medication with the same name was added. Make sure you understand how and when to take each.   oxyCODONE 5 MG immediate release tablet Commonly known as: Oxy IR/ROXICODONE Take 1-2 tablets (5-10 mg total) by mouth every 6 (six) hours as needed for breakthrough pain (not responding to chronic oxycodone). What changed: You were already taking a medication with the same name, and this prescription was added. Make sure you understand how and when to take each.   Ozempic (0.25 or 0.5 MG/DOSE) 2 MG/3ML Sopn Generic drug: Semaglutide(0.25 or 0.5MG /DOS) Inject 0.5 mg into the skin once a week. Fridays Notes to patient: Resume home regimen   pantoprazole 40 MG tablet Commonly known as:  PROTONIX Take 40 mg by mouth daily.   rizatriptan 10 MG tablet Commonly known as: MAXALT Take 10 mg by mouth every 6 (six) hours as needed for migraine. May repeat in 2 hours if needed Notes to patient: Resume home regimen   Symbicort 80-4.5 MCG/ACT inhaler Generic drug: budesonide-formoterol Inhale into the lungs. Notes to patient: Resume home regimen   vitamin C 1000 MG tablet Take 1,000 mg by mouth daily. Notes to patient: Resume home regimen   Vitamin D 125 MCG (5000 UT) Caps Take 2 capsules by mouth daily. Notes to patient: Resume home regimen   zinc gluconate 50 MG tablet Take 50 mg by mouth daily. Notes to patient: Resume home regimen               Discharge Care Instructions  (From admission, onward)           Start     Ordered   11/29/22 0000  Weight bearing as tolerated        11/29/22 0758   11/29/22 0000  Change dressing       Comments: You may remove the bulky bandage (ACE wrap and gauze) two days after surgery. You will have an adhesive waterproof bandage underneath. Leave this in place until your first follow-up appointment.   11/29/22 0758            Diagnostic Studies: No results found.  Disposition: Discharge disposition: 01-Home or Self Care       Discharge Instructions     Call MD / Call 911   Complete by: As directed    If you experience chest pain or shortness of breath, CALL 911 and be transported to the hospital emergency room.  If you develope a fever above 101 F, pus (white drainage) or increased drainage or redness at the wound, or calf pain, call your surgeon's office.   Change dressing   Complete by: As directed    You may remove the bulky bandage (ACE wrap and gauze) two days after surgery. You will have an adhesive waterproof bandage underneath. Leave this in place until your first follow-up appointment.   Constipation Prevention   Complete by:  As directed    Drink plenty of fluids.  Prune juice may be helpful.   You may use a stool softener, such as Colace (over the counter) 100 mg twice a day.  Use MiraLax (over the counter) for constipation as needed.   Diet - low sodium heart healthy   Complete by: As directed    Do not put a pillow under the knee. Place it under the heel.   Complete by: As directed    Driving restrictions   Complete by: As directed    No driving for two weeks   Post-operative opioid taper instructions:   Complete by: As directed    POST-OPERATIVE OPIOID TAPER INSTRUCTIONS: It is important to wean off of your opioid medication as soon as possible. If you do not need pain medication after your surgery it is ok to stop day one. Opioids include: Codeine, Hydrocodone(Norco, Vicodin), Oxycodone(Percocet, oxycontin) and hydromorphone amongst others.  Long term and even short term use of opiods can cause: Increased pain response Dependence Constipation Depression Respiratory depression And more.  Withdrawal symptoms can include Flu like symptoms Nausea, vomiting And more Techniques to manage these symptoms Hydrate well Eat regular healthy meals Stay active Use relaxation techniques(deep breathing, meditating, yoga) Do Not substitute Alcohol to help with tapering If you have been on opioids for less than two weeks and do not have pain than it is ok to stop all together.  Plan to wean off of opioids This plan should start within one week post op of your joint replacement. Maintain the same interval or time between taking each dose and first decrease the dose.  Cut the total daily intake of opioids by one tablet each day Next start to increase the time between doses. The last dose that should be eliminated is the evening dose.      TED hose   Complete by: As directed    Use stockings (TED hose) for three weeks on both leg(s).  You may remove them at night for sleeping.   Weight bearing as tolerated   Complete by: As directed         Follow-up Information      Aluisio, Homero Fellers, MD Follow up in 2 week(s).   Specialty: Orthopedic Surgery Contact information: [address] [place] [address] [place] [address] [phone]                  Signed: Arther Abbott 11/30/2022, 8:11 AM

## 2023-01-05 ENCOUNTER — Other Ambulatory Visit: Payer: Self-pay | Admitting: *Deleted

## 2023-01-05 DIAGNOSIS — L89310 Pressure ulcer of right buttock, unstageable: Secondary | ICD-10-CM

## 2023-01-11 ENCOUNTER — Ambulatory Visit (INDEPENDENT_AMBULATORY_CARE_PROVIDER_SITE_OTHER): Payer: Medicare HMO | Admitting: General Surgery

## 2023-01-11 ENCOUNTER — Encounter: Payer: Self-pay | Admitting: General Surgery

## 2023-01-11 VITALS — BP 99/67 | HR 79 | Temp 98.0°F | Resp 12 | Ht 63.0 in | Wt 211.0 lb

## 2023-01-11 DIAGNOSIS — L89310 Pressure ulcer of right buttock, unstageable: Secondary | ICD-10-CM

## 2023-01-11 NOTE — Progress Notes (Unsigned)
[facility] History and Physical  Reason for Referral: Right buttock pressure ulcer Referring Physician: Wallace Cullens, FNP  Chief Complaint   New Patient (Initial Visit)     [name] is a 66 y.o. female.  HPI: Patient is s/p R TKA on 11/28/2022. She notes that shortly after her surgery she developed a pressure ulcer on her right buttock. She first noticed pain in the area when standing soon after surgery. She then noticed a big erythematous area on her right buttock. It has only worsened since and is now ~2 inches in size. The area is painful and sitting on a cushion does not really help. She has also noticed bloody drainage.  Denies fevers or chills. She saw her PCP last week who obtained a CBC that was normal so no abx were prescribed. The area has not been imaged.  She does not take any blood thinners. She was on ASA for knee 3 weeks after her surgery (ended ~3 weeks ago).  She endorses chronic, occasional stress incontinence with urination and bowel movements that is unrelated to this issue.     Past Medical History:  Diagnosis Date   Anxiety    Asthma    Chronic pain    Depression    Diabetes mellitus without complication (HCC)    Fibromyalgia    GERD (gastroesophageal reflux disease)    Headache    migraines   Hyperlipidemia    Hypothyroidism    Osteoarthritis    PONV (postoperative nausea and vomiting)     Past Surgical History:  Procedure Laterality Date   ABDOMINAL HYSTERECTOMY     ANKLE RECONSTRUCTION Left    1977   APPENDECTOMY     CARPAL TUNNEL RELEASE Right    2010   CATARACT EXTRACTION Left    02/2021   catheter ablation     1993   CESAREAN SECTION     x2   CHOLECYSTECTOMY     HERNIA REPAIR     KNEE ARTHROSCOPY W/ MENISCECTOMY Right    2008   KNEE DISLOCATION SURGERY Right    1977   left carpal tunnel release      panectomy     2019   TOTAL HIP ARTHROPLASTY Right 04/12/2021   Procedure: TOTAL HIP ARTHROPLASTY ANTERIOR  APPROACH;  Surgeon: Ollen Gross, MD;  Location: [facility];  Service: Orthopedics;  Laterality: Right;   TOTAL KNEE ARTHROPLASTY Right 11/28/2022   Procedure: TOTAL KNEE ARTHROPLASTY;  Surgeon: Ollen Gross, MD;  Location: [facility];  Service: Orthopedics;  Laterality: Right;   vaginal fistula of sigmoid colon      No family history on file.  Social History   Tobacco Use   Smoking status: Former    Types: Cigarettes    Passive exposure: Never   Smokeless tobacco: Never  Vaping Use   Vaping status: Never Used  Substance Use Topics   Alcohol use: Never   Drug use: Never    Medications: I have reviewed the patient's current medications. Allergies as of 01/11/2023       Reactions   Hydromorphone Other (See Comments), Shortness Of Breath   Respiratory arrest per patient Other reaction(s): coded with fast admin Note: Imported from external source.   Penicillins Anaphylaxis, Other (See Comments)   Erythromycin Base Nausea And Vomiting   Ibuprofen Other (See Comments)   Per pt, it makes her bleed   Liraglutide    Meperidine Hives   Meperidine Hcl Other (See Comments)   Nsaids Other (  See Comments)   GI bleed        Medication List        Accurate as of January 11, 2023  1:48 PM. If you have any questions, ask your nurse or doctor.          albuterol 108 (90 Base) MCG/ACT inhaler Commonly known as: VENTOLIN HFA Inhale into the lungs.   buPROPion 150 MG 12 hr tablet Commonly known as: WELLBUTRIN SR Take 1 tablet (150 mg total) by mouth 2 (two) times daily.   busPIRone 10 MG tablet Commonly known as: BUSPAR Take 1.5 tablets (15 mg total) by mouth 2 (two) times daily.   cariprazine 3 MG capsule Commonly known as: Vraylar Take 1 capsule (3 mg total) by mouth daily.   COLLAGEN PO Take 6,000 mg by mouth daily.   cyanocobalamin 2000 MCG tablet Take 2,000 mcg by mouth daily.   estradiol 0.5 MG tablet Commonly known as: ESTRACE Take 0.5 mg by mouth daily.    ferrous gluconate 324 MG tablet Commonly known as: FERGON Take 324 mg by mouth daily with breakfast.   fluconazole 150 MG tablet Commonly known as: DIFLUCAN Take 150 mg by mouth once a week.   FLUoxetine 40 MG capsule Commonly known as: PROzac Take 1 capsule (40 mg total) by mouth daily.   furosemide 20 MG tablet Commonly known as: LASIX Take 20 mg by mouth every Monday, Wednesday, and Friday.   gabapentin 600 MG tablet Commonly known as: NEURONTIN Take 600 mg by mouth in the morning, at noon, in the evening, and at bedtime.   insulin aspart 100 UNIT/ML injection Commonly known as: novoLOG Inject 5-8 Units into the skin 3 (three) times daily before meals.   Lantus SoloStar 100 UNIT/ML Solostar Pen Generic drug: insulin glargine Inject 44 Units into the skin daily. At hs   levocetirizine 5 MG tablet Commonly known as: XYZAL Take 5 mg by mouth every evening.   levothyroxine 175 MCG tablet Commonly known as: SYNTHROID Take 175 mcg by mouth daily before breakfast.   LORazepam 0.5 MG tablet Commonly known as: ATIVAN Take one to 2 tablets daily as needed for panic attacks.   lovastatin 40 MG tablet Commonly known as: MEVACOR Take 40 mg by mouth at bedtime.   meclizine 25 MG tablet Commonly known as: ANTIVERT Take 25 mg by mouth 3 (three) times daily as needed for dizziness.   methocarbamol 500 MG tablet Commonly known as: ROBAXIN Take 1 tablet (500 mg total) by mouth every 6 (six) hours as needed for muscle spasms.   montelukast 10 MG tablet Commonly known as: SINGULAIR Take 10 mg by mouth daily.   ondansetron 4 MG tablet Commonly known as: ZOFRAN Take 4 mg by mouth 3 (three) times daily as needed for refractory nausea / vomiting. What changed: Another medication with the same name was removed. Continue taking this medication, and follow the directions you see here. Changed by: Lucretia Roers   oxybutynin 5 MG tablet Commonly known as: DITROPAN Take 5 mg  by mouth daily.   oxyCODONE 15 MG immediate release tablet Commonly known as: ROXICODONE Take 15 mg by mouth. What changed: Another medication with the same name was removed. Continue taking this medication, and follow the directions you see here. Changed by: Lucretia Roers   Ozempic (0.25 or 0.5 MG/DOSE) 2 MG/3ML Sopn Generic drug: Semaglutide(0.25 or 0.5MG /DOS) Inject 0.5 mg into the skin once a week. Fridays   pantoprazole 40 MG tablet Commonly known  as: PROTONIX Take 40 mg by mouth daily.   rizatriptan 10 MG tablet Commonly known as: MAXALT Take 10 mg by mouth every 6 (six) hours as needed for migraine. May repeat in 2 hours if needed   Symbicort 80-4.5 MCG/ACT inhaler Generic drug: budesonide-formoterol Inhale into the lungs.   vitamin C 1000 MG tablet Take 1,000 mg by mouth daily.   Vitamin D 125 MCG (5000 UT) Caps Take 2 capsules by mouth daily.   zinc gluconate 50 MG tablet Take 50 mg by mouth daily.         ROS:  Pertinent items are noted in HPI.  Blood pressure 99/67, pulse 79, temperature 98 F (36.7 C), temperature source Oral, resp. rate 12, height 5\' 3"  (1.6 m), weight 211 lb (95.7 kg), SpO2 93%. Physical Exam General: pleasant and well-appearing CV: RRR, no murmurs Pulm: no increased work of breathing Abdominal: Soft, nondistended. No tenderness of palpation, rebound or guarding. Perianal: 4cm wound on the middle left buttock at the 9 o'clock position from the anus with overlying dry eschar. Erythema and discrete borders at the edges of the eschar. Area of induration superiorly to the wound. Tenderness to palpation at the inferior border of the wound. Psych: A&O x3    Results: No results found for this or any previous visit (from the past 48 hour(s)).  No results found.   Assessment & Plan:  [name] is a 67 y.o. female with Mhx significant for HTN, HLD, DMII (takes Ozempic every Friday), anemia, anxiety/depression who is s/p R TKA  11/28/22 presenting with stage III left buttock pressure ulcer that has developed over 7 weeks. Unsure of the exact etiology of her wound but unlikely to have been iatrogenic. Eschar noted to be dry, hard, and non-boggy. No concerns for infection at the moment. I recommended bedside debridement.  The risk and benefits of debridement were discussed including but not limited to bleeding and infection. After careful consideration, [name] decided to pursue bedside debridement. All questions were answered to the satisfaction of the patient and family.  The area was prepped and draped in the usual sterile fashion. 20 cc of Lidocaine was applied around the wound. Surgical excision of the wound down to healthy tissue was completed with a scalpel. Bedside debridement completed with healthy fat underneath and minimal bleeding. The area was covered with dressing. Patient advised about wound care.  -Bedside debridement of the wound in clinic today -WTD dressing changes. Also recommend Santyl if able to purchase -Pain medication prn -Return precautions provided -Return to clinic in 1 week  Dominica Severin 01/11/2023, 1:48 PM

## 2023-01-11 NOTE — Patient Instructions (Addendum)
Saline dampened gauze and cover with gauze/pad and tape.  Can shower.  Can change dressing more than once a day if desired or if needed.

## 2023-01-12 ENCOUNTER — Encounter: Payer: Self-pay | Admitting: General Surgery

## 2023-01-19 ENCOUNTER — Encounter: Payer: Self-pay | Admitting: General Surgery

## 2023-01-19 ENCOUNTER — Ambulatory Visit (INDEPENDENT_AMBULATORY_CARE_PROVIDER_SITE_OTHER): Payer: Medicare HMO | Admitting: General Surgery

## 2023-01-19 VITALS — BP 93/64 | HR 75 | Temp 97.9°F | Resp 12 | Ht 63.0 in | Wt 212.0 lb

## 2023-01-19 DIAGNOSIS — L89310 Pressure ulcer of right buttock, unstageable: Secondary | ICD-10-CM

## 2023-01-19 NOTE — Patient Instructions (Signed)
Continue packing with wet to dry and do daily to twice daily. Call with concerns.

## 2023-01-19 NOTE — Progress Notes (Signed)
[facility]  Doing well with dressing changes. Still with pain.  BP 93/64   Pulse 75   Temp 97.9 F (36.6 C) (Oral)   Resp 12   Ht 5\' 3"  (1.6 m)   Wt 212 lb (96.2 kg)   SpO2 95%   BMI 37.55 kg/m  Area decreased in size, partial granulation and some fibrinous tissue in the base   Patient s/p excisional debridement of pressure ulcer. Doing well.   Continue packing with wet to dry and do daily to twice daily. Call with concerns.  Samples given   Future Appointments  Date Time Provider Department Center  01/23/2023  2:00 PM Mozingo, Thereasa Solo, NP CP[facility] None  02/09/2023  1:45 PM Lucretia Roers, MD [facility] None  04/04/2023  1:15 PM Glean Salvo, NP [facility] None   Algis Greenhouse, MD [facility] [address] [place], [place] [address] [phone] (office)

## 2023-01-23 ENCOUNTER — Telehealth (INDEPENDENT_AMBULATORY_CARE_PROVIDER_SITE_OTHER): Payer: Medicare HMO | Admitting: Adult Health

## 2023-01-23 ENCOUNTER — Encounter: Payer: Self-pay | Admitting: Adult Health

## 2023-01-23 DIAGNOSIS — F4310 Post-traumatic stress disorder, unspecified: Secondary | ICD-10-CM

## 2023-01-23 DIAGNOSIS — F339 Major depressive disorder, recurrent, unspecified: Secondary | ICD-10-CM | POA: Diagnosis not present

## 2023-01-23 DIAGNOSIS — F410 Panic disorder [episodic paroxysmal anxiety] without agoraphobia: Secondary | ICD-10-CM | POA: Diagnosis not present

## 2023-01-23 DIAGNOSIS — F411 Generalized anxiety disorder: Secondary | ICD-10-CM

## 2023-01-23 DIAGNOSIS — F331 Major depressive disorder, recurrent, moderate: Secondary | ICD-10-CM

## 2023-01-23 DIAGNOSIS — G4700 Insomnia, unspecified: Secondary | ICD-10-CM

## 2023-01-23 NOTE — Addendum Note (Signed)
Addended by: Dorothyann Gibbs on: 01/23/2023 02:23 PM   Modules accepted: Level of Service

## 2023-01-23 NOTE — Progress Notes (Addendum)
[name] [id number] December 20, 1955 67 y.o.  Virtual Visit via Video Note  I connected with pt @ on 01/23/23 at  2:00 PM EDT by a video enabled telemedicine application and verified that I am speaking with the correct person using two identifiers.   I discussed the limitations of evaluation and management by telemedicine and the availability of in person appointments. The patient expressed understanding and agreed to proceed.  I discussed the assessment and treatment plan with the patient. The patient was provided an opportunity to ask questions and all were answered. The patient agreed with the plan and demonstrated an understanding of the instructions.   The patient was advised to call back or seek an in-person evaluation if the symptoms worsen or if the condition fails to improve as anticipated.  I provided 25 minutes of non-face-to-face time during this encounter.  The patient was located at home.  The provider was located at [facility].   Dorothyann Gibbs, NP   Subjective:   Patient ID:  [name] is a 67 y.o. (DOB 09-Dec-1955) female.  Chief Complaint: No chief complaint on file.   HPI Annaelle B Uhlig presents for follow-up of MDD, [name], [name], insomnia, and panic attacks.  Describes mood today as "ok". Pleasant. Tearful at times. Mood symptoms - reports anxiety. Reports decreased depression - "episodes". Reports decreased irritability. Denies recent panic attacks. Reports decreased worry, rumination, and over thinking. Mood has improved. Stating "I feel like I'm doing better". Feels like current medication regimen is helpful. Reports lower interest and motivation - "having to make myself do things". Taking medications as prescribed. Followed for multiple medical issues. Energy levels lower. Active, does not have a regular exercise routine with physical disabilities. Enjoys some usual interests and activities. Divorced from husband of 40 years, but lives with  ex-husband and his mother in law - 75 years old. Has a dog yorkie - "[name]". Has 3 grown children. Spending time with family. Appetite adequate. Weight loss - 234 to 211pounds - 63". A1C 6.2 - started on Ozempic 3 months ago. Sleeping improved. Averages 6 to 8 hours. Focus and concentration difficulties at times. Completing tasks. Managing aspects of household. 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  Denies AH or VH. Denies self harm. Denies substance abuse. Reporting some "paranoia" when in crowded spaces.   Chronic pain - working with pain management.  Seeing Dr. Lucia Gaskins - managing migraines  Previous medication trials: Cymbalta, Prozac   Review of Systems:  Review of Systems  Musculoskeletal:  Negative for gait problem.  Neurological:  Negative for tremors.  Psychiatric/Behavioral:         Please refer to HPI    Medications: I have reviewed the patient's current medications.  Current Outpatient Medications  Medication Sig Dispense Refill   albuterol (VENTOLIN HFA) 108 (90 Base) MCG/ACT inhaler Inhale into the lungs.     Ascorbic Acid (VITAMIN C) 1000 MG tablet Take 1,000 mg by mouth daily.     buPROPion (WELLBUTRIN SR) 150 MG 12 hr tablet Take 1 tablet (150 mg total) by mouth 2 (two) times daily. 180 tablet 3   busPIRone (BUSPAR) 10 MG tablet Take 1.5 tablets (15 mg total) by mouth 2 (two) times daily. 60 tablet 5   cariprazine (VRAYLAR) 3 MG capsule Take 1 capsule (3 mg total) by mouth daily. 30 capsule 2   Cholecalciferol (VITAMIN D) 125 MCG (5000 UT) CAPS Take 2 capsules by mouth daily.  COLLAGEN PO Take 6,000 mg by mouth daily.     cyanocobalamin 2000 MCG tablet Take 2,000 mcg by mouth daily.     estradiol (ESTRACE) 0.5 MG tablet Take 0.5 mg by mouth daily.     ferrous gluconate (FERGON) 324 MG tablet Take 324 mg by mouth daily with breakfast.     fluconazole (DIFLUCAN) 150 MG tablet Take 150 mg by mouth once a week.     FLUoxetine  (PROZAC) 40 MG capsule Take 1 capsule (40 mg total) by mouth daily. 90 capsule 3   furosemide (LASIX) 20 MG tablet Take 20 mg by mouth every Monday, Wednesday, and Friday.     gabapentin (NEURONTIN) 600 MG tablet Take 600 mg by mouth in the morning, at noon, in the evening, and at bedtime.     insulin aspart (NOVOLOG) 100 UNIT/ML injection Inject 5-8 Units into the skin 3 (three) times daily before meals.     LANTUS SOLOSTAR 100 UNIT/ML Solostar Pen Inject 44 Units into the skin daily. At hs     levocetirizine (XYZAL) 5 MG tablet Take 5 mg by mouth every evening.     levothyroxine (SYNTHROID) 175 MCG tablet Take 175 mcg by mouth daily before breakfast.     LORazepam (ATIVAN) 0.5 MG tablet Take one to 2 tablets daily as needed for panic attacks. 60 tablet 2   lovastatin (MEVACOR) 40 MG tablet Take 40 mg by mouth at bedtime.     meclizine (ANTIVERT) 25 MG tablet Take 25 mg by mouth 3 (three) times daily as needed for dizziness.     methocarbamol (ROBAXIN) 500 MG tablet Take 1 tablet (500 mg total) by mouth every 6 (six) hours as needed for muscle spasms. 40 tablet 0   montelukast (SINGULAIR) 10 MG tablet Take 10 mg by mouth daily.     ondansetron (ZOFRAN) 4 MG tablet Take 4 mg by mouth 3 (three) times daily as needed for refractory nausea / vomiting.     oxybutynin (DITROPAN) 5 MG tablet Take 5 mg by mouth daily.     oxyCODONE (ROXICODONE) 15 MG immediate release tablet Take 15 mg by mouth.     OZEMPIC, 0.25 OR 0.5 MG/DOSE, 2 MG/3ML SOPN Inject 0.5 mg into the skin once a week. Fridays     pantoprazole (PROTONIX) 40 MG tablet Take 40 mg by mouth daily.     rizatriptan (MAXALT) 10 MG tablet Take 10 mg by mouth every 6 (six) hours as needed for migraine. May repeat in 2 hours if needed     SYMBICORT 80-4.5 MCG/ACT inhaler Inhale into the lungs.     zinc gluconate 50 MG tablet Take 50 mg by mouth daily.     Current Facility-Administered Medications  Medication Dose Route Frequency Provider Last Rate  Last Admin   Fremanezumab-vfrm SOSY 225 mg  225 mg Subcutaneous Once Anson Fret, MD        Medication Side Effects: None  Allergies:  Allergies  Allergen Reactions   Hydromorphone Other (See Comments) and Shortness Of Breath    Respiratory arrest per patient  Other reaction(s): coded with fast admin  Note: Imported from external source.   Penicillins Anaphylaxis and Other (See Comments)   Erythromycin Base Nausea And Vomiting   Ibuprofen Other (See Comments)    Per pt, it makes her bleed   Liraglutide    Meperidine Hives   Meperidine Hcl Other (See Comments)   Nsaids Other (See Comments)    GI bleed  Past Medical History:  Diagnosis Date   Anxiety    Asthma    Chronic pain    Depression    Diabetes mellitus without complication (HCC)    Fibromyalgia    GERD (gastroesophageal reflux disease)    Headache    migraines   Hyperlipidemia    Hypothyroidism    Osteoarthritis    PONV (postoperative nausea and vomiting)     No family history on file.  Social History   Socioeconomic History   Marital status: Divorced    Spouse name: Not on file   Number of children: Not on file   Years of education: Not on file   Highest education level: Not on file  Occupational History   Not on file  Tobacco Use   Smoking status: Former    Types: Cigarettes    Passive exposure: Never   Smokeless tobacco: Never  Vaping Use   Vaping status: Never Used  Substance and Sexual Activity   Alcohol use: Never   Drug use: Never   Sexual activity: Not Currently    Birth control/protection: None  Other Topics Concern   Not on file  Social History Narrative   Caffiene 1-2 cups daily.   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    Social Determinants of Health   Financial Resource Strain: Not on file  Food Insecurity: No Food Insecurity (11/28/2022)   Hunger Vital Sign    Worried About Running Out of Food in the Last Year: Never true    Ran  Out of Food in the Last Year: Never true  Transportation Needs: No Transportation Needs (11/28/2022)   PRAPARE - Administrator, Civil Service (Medical): No    Lack of Transportation (Non-Medical): No  Physical Activity: Not on file  Stress: Not on file  Social Connections: Not on file  Intimate Partner Violence: Not At Risk (11/28/2022)   Humiliation, Afraid, Rape, and Kick questionnaire    Fear of Current or Ex-Partner: No    Emotionally Abused: No    Physically Abused: No    Sexually Abused: No    Past Medical History, Surgical history, Social history, and Family history were reviewed and updated as appropriate.   Please see review of systems for further details on the patient's review from today.   Objective:   Physical Exam:  There were no vitals taken for this visit.  Physical Exam Constitutional:      General: She is not in acute distress. Musculoskeletal:        General: No deformity.  Neurological:     Mental Status: She is alert and oriented to person, place, and time.     Coordination: Coordination normal.  Psychiatric:        Attention and Perception: Attention and perception normal. She does not perceive auditory or visual hallucinations.        Mood and Affect: Affect is not labile, blunt, angry or inappropriate.        Speech: Speech normal.        Behavior: Behavior normal.        Thought Content: Thought content normal. Thought content is not paranoid or delusional. Thought content does not include homicidal or suicidal ideation. Thought content does not include homicidal or suicidal plan.        Cognition and Memory: Cognition and memory normal.        Judgment: Judgment normal.  Comments: Insight intact     Lab Review:     Component Value Date/Time   NA 138 11/29/2022 0321   K 3.9 11/29/2022 0321   CL 102 11/29/2022 0321   CO2 29 11/29/2022 0321   GLUCOSE 183 (H) 11/29/2022 0321   BUN 12 11/29/2022 0321   CREATININE 0.73 11/29/2022  0321   CALCIUM 8.7 (L) 11/29/2022 0321   PROT 5.8 (L) 12/23/2021 0527   ALBUMIN 2.8 (L) 12/23/2021 0527   AST 16 12/23/2021 0527   ALT 15 12/23/2021 0527   ALKPHOS 54 12/23/2021 0527   BILITOT 0.6 12/23/2021 0527   GFRNONAA >60 11/29/2022 0321       Component Value Date/Time   WBC 16.0 (H) 11/29/2022 0321   RBC 4.31 11/29/2022 0321   HGB 12.9 11/29/2022 0321   HCT 39.9 11/29/2022 0321   PLT 212 11/29/2022 0321   MCV 92.6 11/29/2022 0321   MCH 29.9 11/29/2022 0321   MCHC 32.3 11/29/2022 0321   RDW 13.2 11/29/2022 0321   LYMPHSABS 1.6 12/22/2021 0800   MONOABS 0.8 12/22/2021 0800   EOSABS 0.0 12/22/2021 0800   BASOSABS 0.1 12/22/2021 0800    No results found for: "POCLITH", "LITHIUM"   No results found for: "PHENYTOIN", "PHENOBARB", "VALPROATE", "CBMZ"   .res Assessment: Plan:    Plan:  PDMP reviewed  Buspar 15mg  BID - PCP Wellbutrin SR 150mg  BID - PCP  Prozac 40mg  daily  Trazadone 100mg  at bedtime Ativan 0.5mg  BID daily prn as needed - hasn't taken in a month  Vraylar 3mg  daily - patient assistance approved. Patient reporting tremor - will monitor over next 4 weeks - may need to reduce dose.  Time spent with patient was 25 minutes. Greater than 50% of face to face time with patient was spent on counseling and coordination of care.    RTC 2 months  Patient advised to contact office with any questions, adverse effects, or acute worsening in signs and symptoms.   Discussed potential benefits, risk, and side effects of benzodiazepines to include potential risk of tolerance and dependence, as well as possible drowsiness. Advised patient not to drive if experiencing drowsiness and to take lowest possible effective dose to minimize risk of dependence and tolerance.   There are no diagnoses linked to this encounter.   Please see After Visit Summary for patient specific instructions.  Future Appointments  Date Time Provider Department Center  02/09/2023  1:45 PM  Lucretia Roers, MD [facility] None  04/04/2023  1:15 PM Glean Salvo, NP [facility] None    No orders of the defined types were placed in this encounter.     -------------------------------

## 2023-02-06 ENCOUNTER — Telehealth: Payer: Self-pay | Admitting: *Deleted

## 2023-02-06 NOTE — Telephone Encounter (Signed)
Surgical Date: 01/11/2023 Procedure: Excisional Debridement, Buttocks  Received call from patient [phone]~ telephone.   Patient reports that he she has increased drainage with foul odor to wound. States that area is more tender as well. States that she is having to change her dressing 3- 4 x daily.   Appointment changed to 02/07/2023.

## 2023-02-07 ENCOUNTER — Encounter: Payer: Self-pay | Admitting: General Surgery

## 2023-02-07 ENCOUNTER — Ambulatory Visit: Payer: Medicare HMO | Admitting: General Surgery

## 2023-02-07 VITALS — BP 112/74 | HR 75 | Temp 98.2°F | Resp 14 | Ht 63.0 in | Wt 209.0 lb

## 2023-02-07 DIAGNOSIS — L89310 Pressure ulcer of right buttock, unstageable: Secondary | ICD-10-CM | POA: Diagnosis not present

## 2023-02-07 NOTE — Progress Notes (Unsigned)
[facility]  Patient worried about her buttock wound. She says it has had more drainage and had a foul odor. They have been packing it several times a day.  BP 112/74   Pulse 75   Temp 98.2 F (36.8 C) (Oral)   Resp 14   Ht 5\' 3"  (1.6 m)   Wt 209 lb (94.8 kg)   SpO2 96%   BMI 37.02 kg/m  Necrotic fat and skin and the medial superior edge of the wound, right buttock pressure ulcer    Procedure: Excisional debridement of skin and subcutaneous fat 2X3cm  Description: The area was prepped with betadine. Lidocaine was injected around the area. Using sharp debridement with a scalpel and scissors I excised the necrotic skin and fat, finding a tunnel of necrotic fat going superiorly. The total excisional debridement was 2x3cm. Excision was down to healthy bleeding tissue. Packing replaced.  Post Debridement:   Patient s/p additional excisional debridement of pressure wound.   Continue wet to dry packing twice daily to three times daily.  Future Appointments  Date Time Provider Department Center  02/16/2023  2:15 PM Lucretia Roers, MD [facility] None  04/04/2023  1:15 PM Glean Salvo, NP [facility] None   Algis Greenhouse, MD [facility] [address] [place], [place] [address] [phone] (office)

## 2023-02-07 NOTE — Patient Instructions (Signed)
Continue wet to dry packing twice daily to three times daily.

## 2023-02-09 ENCOUNTER — Ambulatory Visit: Payer: Medicare HMO | Admitting: General Surgery

## 2023-02-16 ENCOUNTER — Ambulatory Visit (INDEPENDENT_AMBULATORY_CARE_PROVIDER_SITE_OTHER): Payer: Medicare HMO | Admitting: General Surgery

## 2023-02-16 ENCOUNTER — Encounter: Payer: Self-pay | Admitting: General Surgery

## 2023-02-16 VITALS — BP 101/68 | HR 81 | Temp 98.4°F | Resp 12 | Ht 63.0 in | Wt 212.0 lb

## 2023-02-16 DIAGNOSIS — L89310 Pressure ulcer of right buttock, unstageable: Secondary | ICD-10-CM | POA: Diagnosis not present

## 2023-02-16 NOTE — Progress Notes (Signed)
[facility] History and Physical   Chief Complaint   Follow-up     [name] is a 67 y.o. female.  HPI: [name] is known to me with a right buttock pressure wound she suffered after a knee procedure. She has had debridements in the office under local but continues to have a track of necrotic fat that keeps forming. She has been having drainage and foul smell. We have done debridement with local x 2 to try to get this cleaned up to heal and her husband is packing the area 3 times a day.  She is on Ozempic and took her last dose 02/10/23.  Past Medical History:  Diagnosis Date   Anxiety    Asthma    Chronic pain    Depression    Diabetes mellitus without complication (HCC)    Fibromyalgia    GERD (gastroesophageal reflux disease)    Headache    migraines   Hyperlipidemia    Hypothyroidism    Osteoarthritis    PONV (postoperative nausea and vomiting)     Past Surgical History:  Procedure Laterality Date   ABDOMINAL HYSTERECTOMY     ANKLE RECONSTRUCTION Left    1977   APPENDECTOMY     CARPAL TUNNEL RELEASE Right    2010   CATARACT EXTRACTION Left    02/2021   catheter ablation     1993   CESAREAN SECTION     x2   CHOLECYSTECTOMY     HERNIA REPAIR     KNEE ARTHROSCOPY W/ MENISCECTOMY Right    2008   KNEE DISLOCATION SURGERY Right    1977   left carpal tunnel release      panectomy     2019   TOTAL HIP ARTHROPLASTY Right 04/12/2021   Procedure: TOTAL HIP ARTHROPLASTY ANTERIOR APPROACH;  Surgeon: Ollen Gross, MD;  Location: [facility];  Service: Orthopedics;  Laterality: Right;   TOTAL KNEE ARTHROPLASTY Right 11/28/2022   Procedure: TOTAL KNEE ARTHROPLASTY;  Surgeon: Ollen Gross, MD;  Location: [facility];  Service: Orthopedics;  Laterality: Right;   vaginal fistula of sigmoid colon      No family history on file.  Social History   Tobacco Use   Smoking status: Former    Types: Cigarettes    Passive exposure: Never   Smokeless tobacco:  Never  Vaping Use   Vaping status: Never Used  Substance Use Topics   Alcohol use: Never   Drug use: Never    Medications: I have reviewed the patient's current medications. Allergies as of 02/16/2023       Reactions   Hydromorphone Other (See Comments), Shortness Of Breath   Respiratory arrest per patient Other reaction(s): coded with fast admin Note: Imported from external source.   Penicillins Anaphylaxis, Other (See Comments)   Erythromycin Base Nausea And Vomiting   Ibuprofen Other (See Comments)   Per pt, it makes her bleed   Liraglutide    Meperidine Hives   Meperidine Hcl Other (See Comments)   Nsaids Other (See Comments)   GI bleed        Medication List        Accurate as of February 16, 2023  2:28 PM. If you have any questions, ask your nurse or doctor.          albuterol 108 (90 Base) MCG/ACT inhaler Commonly known as: VENTOLIN HFA Inhale into the lungs.   buPROPion 150 MG 12 hr tablet Commonly known as: WELLBUTRIN SR Take  1 tablet (150 mg total) by mouth 2 (two) times daily.   busPIRone 10 MG tablet Commonly known as: BUSPAR Take 1.5 tablets (15 mg total) by mouth 2 (two) times daily.   cariprazine 3 MG capsule Commonly known as: Vraylar Take 1 capsule (3 mg total) by mouth daily.   COLLAGEN PO Take 6,000 mg by mouth daily.   cyanocobalamin 2000 MCG tablet Take 2,000 mcg by mouth daily.   estradiol 0.5 MG tablet Commonly known as: ESTRACE Take 0.5 mg by mouth daily.   ferrous gluconate 324 MG tablet Commonly known as: FERGON Take 324 mg by mouth daily with breakfast.   fluconazole 150 MG tablet Commonly known as: DIFLUCAN Take 150 mg by mouth once a week.   FLUoxetine 40 MG capsule Commonly known as: PROzac Take 1 capsule (40 mg total) by mouth daily.   furosemide 20 MG tablet Commonly known as: LASIX Take 20 mg by mouth every Monday, Wednesday, and Friday.   gabapentin 600 MG tablet Commonly known as: NEURONTIN Take 600 mg  by mouth in the morning, at noon, in the evening, and at bedtime.   insulin aspart 100 UNIT/ML injection Commonly known as: novoLOG Inject 5-8 Units into the skin 3 (three) times daily before meals.   Lantus SoloStar 100 UNIT/ML Solostar Pen Generic drug: insulin glargine Inject 44 Units into the skin daily. At hs   levocetirizine 5 MG tablet Commonly known as: XYZAL Take 5 mg by mouth every evening.   levothyroxine 175 MCG tablet Commonly known as: SYNTHROID Take 175 mcg by mouth daily before breakfast.   LORazepam 0.5 MG tablet Commonly known as: ATIVAN Take one to 2 tablets daily as needed for panic attacks.   lovastatin 40 MG tablet Commonly known as: MEVACOR Take 40 mg by mouth at bedtime.   meclizine 25 MG tablet Commonly known as: ANTIVERT Take 25 mg by mouth 3 (three) times daily as needed for dizziness.   methocarbamol 500 MG tablet Commonly known as: ROBAXIN Take 1 tablet (500 mg total) by mouth every 6 (six) hours as needed for muscle spasms.   montelukast 10 MG tablet Commonly known as: SINGULAIR Take 10 mg by mouth daily.   ondansetron 4 MG tablet Commonly known as: ZOFRAN Take 4 mg by mouth 3 (three) times daily as needed for refractory nausea / vomiting.   oxybutynin 5 MG tablet Commonly known as: DITROPAN Take 5 mg by mouth daily.   oxyCODONE 15 MG immediate release tablet Commonly known as: ROXICODONE Take 15 mg by mouth.   Ozempic (0.25 or 0.5 MG/DOSE) 2 MG/3ML Sopn Generic drug: Semaglutide(0.25 or 0.5MG /DOS) Inject 0.5 mg into the skin once a week. Fridays   pantoprazole 40 MG tablet Commonly known as: PROTONIX Take 40 mg by mouth daily.   rizatriptan 10 MG tablet Commonly known as: MAXALT Take 10 mg by mouth every 6 (six) hours as needed for migraine. May repeat in 2 hours if needed   Symbicort 80-4.5 MCG/ACT inhaler Generic drug: budesonide-formoterol Inhale into the lungs.   vitamin C 1000 MG tablet Take 1,000 mg by mouth  daily.   Vitamin D 125 MCG (5000 UT) Caps Take 2 capsules by mouth daily.   zinc gluconate 50 MG tablet Take 50 mg by mouth daily.         ROS:  A comprehensive review of systems was negative except for: Musculoskeletal: positive for right buttock pain and drainage  Blood pressure 101/68, pulse 81, temperature 98.4 F (36.9 C), temperature  source Oral, resp. rate 12, height 5\' 3"  (1.6 m), weight 212 lb (96.2 kg), SpO2 94%. Physical Exam Vitals reviewed.  HENT:     Head: Normocephalic.     Nose: Nose normal.  Eyes:     Extraocular Movements: Extraocular movements intact.  Cardiovascular:     Rate and Rhythm: Normal rate and regular rhythm.  Pulmonary:     Effort: Pulmonary effort is normal.     Breath sounds: Normal breath sounds.  Abdominal:     General: There is no distension.     Palpations: Abdomen is soft.  Musculoskeletal:        General: Normal range of motion.     Comments: Right buttock wound with medial aspect adipose with necrosis and some drainage  Skin:    General: Skin is warm.  Neurological:     General: No focal deficit present.     Mental Status: She is alert and oriented to person, place, and time.  Psychiatric:        Mood and Affect: Mood normal.        Behavior: Behavior normal.     Results: None   Assessment & Plan:  DEVANIE WAGERS is a 67 y.o. female with a right buttock pressure wound. We have exhausted the ability to do debridement under local and I need to do some debridement with anesthesia so I can get this deep track that continues to have necrosis.   -Discussed excisional debridement, risk of bleeding, infection, larger wound, packing.    All questions were answered to the satisfaction of the patient and family.  Algis Greenhouse, MD [facility] [address] [place], [place] [address] [phone] (office)

## 2023-02-16 NOTE — Patient Instructions (Signed)
[name]  02/16/2023     @PREFPERIOPPHARMACY @   Your procedure is scheduled on  02/20/2023.   Report to [facility] at  0930 A.M.   Call this number if you have problems the morning of surgery:  [phone]  If you experience any cold or flu symptoms such as cough, fever, chills, shortness of breath, etc. between now and your scheduled surgery, please notify us at the above number.   Remember:  Do not eat or drink after midnight.       Your last dose of Ozempic should have been on 02/12/2023.     Take 22 units of your night time insulin the night before your procedure.        DO NOT take any medications for diabetes the morning of your procedure.      Use your inhaler before you come and bring your rescue inhaler with you.    Take these medicines the morning of surgery with A SIP OF WATER            wellbutrin, buspar, vryalar, prozac, gabapentin, levothyroxine, ativan (if needed), antivert(if needed), robaxin (if needed), zofran (if needed), oxycodone(if needed), pantoprazole, maxalt.     Do not wear jewelry, make-up or nail polish, including gel polish,  artificial nails, or any other type of covering on natural nails (fingers and  toes).  Do not wear lotions, powders, or perfumes, or deodorant.  Do not shave 48 hours prior to surgery.  Men may shave face and neck.  Do not bring valuables to the hospital.  [facility] is not responsible for any belongings or valuables.  Contacts, dentures or bridgework may not be worn into surgery.  Leave your suitcase in the car.  After surgery it may be brought to your room.  For patients admitted to the hospital, discharge time will be determined by your treatment team.  Patients discharged the day of surgery will not be allowed to drive home and must have someone with them for 24 hours.    Special instructions:   DO NOT smoke tobacco or vape for 24 hours before your procedure.  Please read over the following fact  sheets that you were given. Coughing and Deep Breathing, Surgical Site Infection Prevention, Anesthesia Post-op Instructions, and Care and Recovery After Surgery       Incision and Drainage, Care After After incision and drainage, it is common to have: Pain or discomfort around the incision site. Blood, fluid, or pus (drainage) from the incision. Redness and firm skin around the incision site. Follow these instructions at home: Medicines Take over-the-counter and prescription medicines only as told by your health care provider. If you were prescribed antibiotics, take them as told by your provider. Do not stop using the antibiotic even if you start to feel better. Do not apply creams, ointments, or liquids unless you have been told to by your provider. Wound care Follow instructions from your provider about how to take care of your wound. Make sure you: Wash your hands with soap and water for at least 20 seconds before and after you change your bandage (dressing). If soap and water are not available, use hand sanitizer. Change your dressing and any packing as told by your provider. If the dressing is dry or stuck when you try to remove it, moisten or wet it with saline or water. This will help you remove it without harming your skin or tissues. If your wound  is packed, leave it in place until your provider tells you to remove it. To remove it, moisten or wet the packing with saline or water. Leave stitches (sutures), skin glue, or tape strips in place. These skin closures may need to stay in place for 2 weeks or longer. If tape strip edges start to loosen and curl up, you may trim the loose edges. Do not remove tape strips completely unless your provider tells you to do that. Check your wound every day for signs of infection. Check for: More redness, swelling, or pain. More fluid or blood. Warmth. Pus or a bad smell. If you were sent home with a drain tube in place, follow instructions  from your provider about: How to empty it. How to care for it at home. Be careful when you get rid of used dressings, wound packing, or drainage. Activity Rest the affected area. Return to your normal activities as told by your provider. Ask your provider what activities are safe for you. General instructions Do not use any products that contain nicotine or tobacco. These products include cigarettes, chewing tobacco, and vaping devices, such as e-cigarettes. These can delay incision healing after surgery. If you need help quitting, ask your provider. Do not take baths, swim, or use a hot tub until your provider approves. Ask your provider if you may take showers. You may only be allowed to take sponge baths. The incision will keep draining. It is normal to have some clear or slightly bloody drainage. The amount of drainage should go down each day. Keep all follow-up visits. Your provider will need to make sure that your incision is healing well and that there are no problems. Your health care provider may give you more instructions. Make sure you know what you can and cannot do Contact a health care provider if: Your cyst or abscess comes back. You have any signs of infection. You notice red streaks that spread away from the incision site. You have a fever or chills. Get help right away if: You have severe pain or bleeding. You become short of breath. You have chest pain. You have signs of a severe infection. You may notice changes in your incision area, such as: Swelling that makes the skin feel hard. Numbness or tingling. Sudden increase in redness. Your skin color may change from red to purple, and then to dark spots. Blisters, ulcers, or splitting of the skin. These symptoms may be an emergency. Get help right away. Call 911. Do not wait to see if the symptoms will go away. Do not drive yourself to the hospital. This information is not intended to replace advice given to you by your  health care provider. Make sure you discuss any questions you have with your health care provider. Document Revised: 01/17/2022 Document Reviewed: 01/17/2022 Elsevier Patient Education  2024 [employer] General Anesthesia, Adult, Care After The following information offers guidance on how to care for yourself after your procedure. Your health care provider may also give you more specific instructions. If you have problems or questions, contact your health care provider. What can I expect after the procedure? After the procedure, it is common for people to: Have pain or discomfort at the IV site. Have nausea or vomiting. Have a sore throat or hoarseness. Have trouble concentrating. Feel cold or chills. Feel weak, sleepy, or tired (fatigue). Have soreness and body aches. These can affect parts of the body that were not involved in surgery. Follow these instructions at home:  For the time period you were told by your health care provider:  Rest. Do not participate in activities where you could fall or become injured. Do not drive or use machinery. Do not drink alcohol. Do not take sleeping pills or medicines that cause drowsiness. Do not make important decisions or sign legal documents. Do not take care of children on your own. General instructions Drink enough fluid to keep your urine pale yellow. If you have sleep apnea, surgery and certain medicines can increase your risk for breathing problems. Follow instructions from your health care provider about wearing your sleep device: Anytime you are sleeping, including during daytime naps. While taking prescription pain medicines, sleeping medicines, or medicines that make you drowsy. Return to your normal activities as told by your health care provider. Ask your health care provider what activities are safe for you. Take over-the-counter and prescription medicines only as told by your health care provider. Do not use any products that  contain nicotine or tobacco. These products include cigarettes, chewing tobacco, and vaping devices, such as e-cigarettes. These can delay incision healing after surgery. If you need help quitting, ask your health care provider. Contact a health care provider if: You have nausea or vomiting that does not get better with medicine. You vomit every time you eat or drink. You have pain that does not get better with medicine. You cannot urinate or have bloody urine. You develop a skin rash. You have a fever. Get help right away if: You have trouble breathing. You have chest pain. You vomit blood. These symptoms may be an emergency. Get help right away. Call 911. Do not wait to see if the symptoms will go away. Do not drive yourself to the hospital. Summary After the procedure, it is common to have a sore throat, hoarseness, nausea, vomiting, or to feel weak, sleepy, or fatigue. For the time period you were told by your health care provider, do not drive or use machinery. Get help right away if you have difficulty breathing, have chest pain, or vomit blood. These symptoms may be an emergency. This information is not intended to replace advice given to you by your health care provider. Make sure you discuss any questions you have with your health care provider. Document Revised: 08/27/2021 Document Reviewed: 08/27/2021 Elsevier Patient Education  2024 [employer] How to Use Chlorhexidine Before Surgery Chlorhexidine gluconate (CHG) is a germ-killing (antiseptic) solution that is used to clean the skin. It can get rid of the bacteria that normally live on the skin and can keep them away for about 24 hours. To clean your skin with CHG, you may be given: A CHG solution to use in the shower or as part of a sponge bath. A prepackaged cloth that contains CHG. Cleaning your skin with CHG may help lower the risk for infection: While you are staying in the intensive care unit of the hospital. If you  have a vascular access, such as a central line, to provide short-term or long-term access to your veins. If you have a catheter to drain urine from your bladder. If you are on a ventilator. A ventilator is a machine that helps you breathe by moving air in and out of your lungs. After surgery. What are the risks? Risks of using CHG include: A skin reaction. Hearing loss, if CHG gets in your ears and you have a perforated eardrum. Eye injury, if CHG gets in your eyes and is not rinsed out. The CHG product catching  fire. Make sure that you avoid smoking and flames after applying CHG to your skin. Do not use CHG: If you have a chlorhexidine allergy or have previously reacted to chlorhexidine. On babies younger than 13 months of age. How to use CHG solution Use CHG only as told by your health care provider, and follow the instructions on the label. Use the full amount of CHG as directed. Usually, this is one bottle. During a shower Follow these steps when using CHG solution during a shower (unless your health care provider gives you different instructions): Start the shower. Use your normal soap and shampoo to wash your face and hair. Turn off the shower or move out of the shower stream. Pour the CHG onto a clean washcloth. Do not use any type of brush or rough-edged sponge. Starting at your neck, lather your body down to your toes. Make sure you follow these instructions: If you will be having surgery, pay special attention to the part of your body where you will be having surgery. Scrub this area for at least 1 minute. Do not use CHG on your head or face. If the solution gets into your ears or eyes, rinse them well with water. Avoid your genital area. Avoid any areas of skin that have broken skin, cuts, or scrapes. Scrub your back and under your arms. Make sure to wash skin folds. Let the lather sit on your skin for 1-2 minutes or as long as told by your health care provider. Thoroughly  rinse your entire body in the shower. Make sure that all body creases and crevices are rinsed well. Dry off with a clean towel. Do not put any substances on your body afterward--such as powder, lotion, or perfume--unless you are told to do so by your health care provider. Only use lotions that are recommended by the manufacturer. Put on clean clothes or pajamas. If it is the night before your surgery, sleep in clean sheets.  During a sponge bath Follow these steps when using CHG solution during a sponge bath (unless your health care provider gives you different instructions): Use your normal soap and shampoo to wash your face and hair. Pour the CHG onto a clean washcloth. Starting at your neck, lather your body down to your toes. Make sure you follow these instructions: If you will be having surgery, pay special attention to the part of your body where you will be having surgery. Scrub this area for at least 1 minute. Do not use CHG on your head or face. If the solution gets into your ears or eyes, rinse them well with water. Avoid your genital area. Avoid any areas of skin that have broken skin, cuts, or scrapes. Scrub your back and under your arms. Make sure to wash skin folds. Let the lather sit on your skin for 1-2 minutes or as long as told by your health care provider. Using a different clean, wet washcloth, thoroughly rinse your entire body. Make sure that all body creases and crevices are rinsed well. Dry off with a clean towel. Do not put any substances on your body afterward--such as powder, lotion, or perfume--unless you are told to do so by your health care provider. Only use lotions that are recommended by the manufacturer. Put on clean clothes or pajamas. If it is the night before your surgery, sleep in clean sheets. How to use CHG prepackaged cloths Only use CHG cloths as told by your health care provider, and follow the instructions on the  label. Use the CHG cloth on clean, dry  skin. Do not use the CHG cloth on your head or face unless your health care provider tells you to. When washing with the CHG cloth: Avoid your genital area. Avoid any areas of skin that have broken skin, cuts, or scrapes. Before surgery Follow these steps when using a CHG cloth to clean before surgery (unless your health care provider gives you different instructions): Using the CHG cloth, vigorously scrub the part of your body where you will be having surgery. Scrub using a back-and-forth motion for 3 minutes. The area on your body should be completely wet with CHG when you are done scrubbing. Do not rinse. Discard the cloth and let the area air-dry. Do not put any substances on the area afterward, such as powder, lotion, or perfume. Put on clean clothes or pajamas. If it is the night before your surgery, sleep in clean sheets.  For general bathing Follow these steps when using CHG cloths for general bathing (unless your health care provider gives you different instructions). Use a separate CHG cloth for each area of your body. Make sure you wash between any folds of skin and between your fingers and toes. Wash your body in the following order, switching to a new cloth after each step: The front of your neck, shoulders, and chest. Both of your arms, under your arms, and your hands. Your stomach and groin area, avoiding the genitals. Your right leg and foot. Your left leg and foot. The back of your neck, your back, and your buttocks. Do not rinse. Discard the cloth and let the area air-dry. Do not put any substances on your body afterward--such as powder, lotion, or perfume--unless you are told to do so by your health care provider. Only use lotions that are recommended by the manufacturer. Put on clean clothes or pajamas. Contact a health care provider if: Your skin gets irritated after scrubbing. You have questions about using your solution or cloth. You swallow any chlorhexidine. Call  your local poison control center [phone])667-7818 in the [place] Get help right away if: Your eyes itch badly, or they become very red or swollen. Your skin itches badly and is red or swollen. Your hearing changes. You have trouble seeing. You have swelling or tingling in your mouth or throat. You have trouble breathing. These symptoms may represent a serious problem that is an emergency. Do not wait to see if the symptoms will go away. Get medical help right away. Call your local emergency services (911 in the [place] Do not drive yourself to the hospital. Summary Chlorhexidine gluconate (CHG) is a germ-killing (antiseptic) solution that is used to clean the skin. Cleaning your skin with CHG may help to lower your risk for infection. You may be given CHG to use for bathing. It may be in a bottle or in a prepackaged cloth to use on your skin. Carefully follow your health care provider's instructions and the instructions on the product label. Do not use CHG if you have a chlorhexidine allergy. Contact your health care provider if your skin gets irritated after scrubbing. This information is not intended to replace advice given to you by your health care provider. Make sure you discuss any questions you have with your health care provider. Document Revised: 09/27/2021 Document Reviewed: 08/10/2020 Elsevier Patient Education  2023 [employer].

## 2023-02-17 ENCOUNTER — Encounter (HOSPITAL_COMMUNITY): Payer: Self-pay

## 2023-02-17 ENCOUNTER — Encounter (HOSPITAL_COMMUNITY)
Admission: RE | Admit: 2023-02-17 | Discharge: 2023-02-17 | Disposition: A | Discharge status: Home / Self Care | Payer: Medicare HMO | Source: Ambulatory Visit | Attending: General Surgery

## 2023-02-17 VITALS — BP 101/68 | HR 81 | Temp 98.4°F | Resp 16 | Ht 63.0 in | Wt 212.0 lb

## 2023-02-17 DIAGNOSIS — L98499 Non-pressure chronic ulcer of skin of other sites with unspecified severity: Secondary | ICD-10-CM | POA: Diagnosis not present

## 2023-02-17 DIAGNOSIS — E11622 Type 2 diabetes mellitus with other skin ulcer: Secondary | ICD-10-CM | POA: Diagnosis not present

## 2023-02-17 DIAGNOSIS — L89310 Pressure ulcer of right buttock, unstageable: Secondary | ICD-10-CM

## 2023-02-17 DIAGNOSIS — L89300 Pressure ulcer of unspecified buttock, unstageable: Secondary | ICD-10-CM

## 2023-02-17 DIAGNOSIS — Z01810 Encounter for preprocedural cardiovascular examination: Secondary | ICD-10-CM | POA: Diagnosis not present

## 2023-02-17 DIAGNOSIS — Z01812 Encounter for preprocedural laboratory examination: Secondary | ICD-10-CM | POA: Diagnosis not present

## 2023-02-17 DIAGNOSIS — D509 Iron deficiency anemia, unspecified: Secondary | ICD-10-CM | POA: Insufficient documentation

## 2023-02-17 DIAGNOSIS — I4510 Unspecified right bundle-branch block: Secondary | ICD-10-CM | POA: Insufficient documentation

## 2023-02-17 DIAGNOSIS — I10 Essential (primary) hypertension: Secondary | ICD-10-CM | POA: Insufficient documentation

## 2023-02-17 DIAGNOSIS — Z01818 Encounter for other preprocedural examination: Secondary | ICD-10-CM | POA: Diagnosis present

## 2023-02-17 LAB — CBC WITH DIFFERENTIAL/PLATELET
Abs Immature Granulocytes: 0.03 10*3/uL (ref 0.00–0.07)
Basophils Absolute: 0.1 10*3/uL (ref 0.0–0.1)
Basophils Relative: 1 %
Eosinophils Absolute: 0.1 10*3/uL (ref 0.0–0.5)
Eosinophils Relative: 1 %
HCT: 43 % (ref 36.0–46.0)
Hemoglobin: 12.9 g/dL (ref 12.0–15.0)
Immature Granulocytes: 0 %
Lymphocytes Relative: 17 %
Lymphs Abs: 1.5 10*3/uL (ref 0.7–4.0)
MCH: 27.7 pg (ref 26.0–34.0)
MCHC: 30 g/dL (ref 30.0–36.0)
MCV: 92.5 fL (ref 80.0–100.0)
Monocytes Absolute: 0.5 10*3/uL (ref 0.1–1.0)
Monocytes Relative: 6 %
Neutro Abs: 6.3 10*3/uL (ref 1.7–7.7)
Neutrophils Relative %: 75 %
Platelets: 245 10*3/uL (ref 150–400)
RBC: 4.65 MIL/uL (ref 3.87–5.11)
RDW: 12.8 % (ref 11.5–15.5)
WBC: 8.5 10*3/uL (ref 4.0–10.5)
nRBC: 0 % (ref 0.0–0.2)

## 2023-02-17 LAB — BASIC METABOLIC PANEL
Anion gap: 10 (ref 5–15)
BUN: 12 mg/dL (ref 8–23)
CO2: 30 mmol/L (ref 22–32)
Calcium: 9.2 mg/dL (ref 8.9–10.3)
Chloride: 100 mmol/L (ref 98–111)
Creatinine, Ser: 0.88 mg/dL (ref 0.44–1.00)
GFR, Estimated: >60 mL/min (ref >60)
Glucose, Bld: 149 mg/dL — ABNORMAL HIGH (ref 70–99)
Potassium: 4.3 mmol/L (ref 3.5–5.1)
Sodium: 140 mmol/L (ref 135–145)

## 2023-02-17 LAB — HEMOGLOBIN A1C
Hgb A1c MFr Bld: 6.3 % — ABNORMAL HIGH (ref 4.8–5.6)
Mean Plasma Glucose: 134.11 mg/dL

## 2023-02-17 NOTE — H&P (Signed)
[facility] History and Physical   Chief Complaint   Follow-up     [name] is a 67 y.o. female.  HPI: [name] is known to me with a right buttock pressure wound she suffered after a knee procedure. She has had debridements in the office under local but continues to have a track of necrotic fat that keeps forming. She has been having drainage and foul smell. We have done debridement with local x 2 to try to get this cleaned up to heal and her husband is packing the area 3 times a day.  She is on Ozempic and took her last dose 02/10/23.  Past Medical History:  Diagnosis Date   Anxiety    Asthma    Chronic pain    Depression    Diabetes mellitus without complication (HCC)    Fibromyalgia    GERD (gastroesophageal reflux disease)    Headache    migraines   Hyperlipidemia    Hypothyroidism    Osteoarthritis    PONV (postoperative nausea and vomiting)     Past Surgical History:  Procedure Laterality Date   ABDOMINAL HYSTERECTOMY     ANKLE RECONSTRUCTION Left    1977   APPENDECTOMY     CARPAL TUNNEL RELEASE Right    2010   CATARACT EXTRACTION Left    02/2021   catheter ablation     1993   CESAREAN SECTION     x2   CHOLECYSTECTOMY     HERNIA REPAIR     KNEE ARTHROSCOPY W/ MENISCECTOMY Right    2008   KNEE DISLOCATION SURGERY Right    1977   left carpal tunnel release      panectomy     2019   TOTAL HIP ARTHROPLASTY Right 04/12/2021   Procedure: TOTAL HIP ARTHROPLASTY ANTERIOR APPROACH;  Surgeon: Melanie Gross, MD;  Location: [facility];  Service: Orthopedics;  Laterality: Right;   TOTAL KNEE ARTHROPLASTY Right 11/28/2022   Procedure: TOTAL KNEE ARTHROPLASTY;  Surgeon: Melanie Gross, MD;  Location: [facility];  Service: Orthopedics;  Laterality: Right;   vaginal fistula of sigmoid colon      No family history on file.  Social History   Tobacco Use   Smoking status: Former    Types: Cigarettes    Passive exposure: Never   Smokeless tobacco:  Never  Vaping Use   Vaping status: Never Used  Substance Use Topics   Alcohol use: Never   Drug use: Never    Medications: I have reviewed the patient's current medications. Allergies as of 02/16/2023       Reactions   Hydromorphone Other (See Comments), Shortness Of Breath   Respiratory arrest per patient Other reaction(s): coded with fast admin Note: Imported from external source.   Penicillins Anaphylaxis, Other (See Comments)   Erythromycin Base Nausea And Vomiting   Ibuprofen Other (See Comments)   Per pt, it makes her bleed   Liraglutide    Meperidine Hives   Meperidine Hcl Other (See Comments)   Nsaids Other (See Comments)   GI bleed        Medication List        Accurate as of February 16, 2023  2:28 PM. If you have any questions, ask your nurse or doctor.          albuterol 108 (90 Base) MCG/ACT inhaler Commonly known as: VENTOLIN HFA Inhale into the lungs.   buPROPion 150 MG 12 hr tablet Commonly known as: WELLBUTRIN SR Take  1 tablet (150 mg total) by mouth 2 (two) times daily.   busPIRone 10 MG tablet Commonly known as: BUSPAR Take 1.5 tablets (15 mg total) by mouth 2 (two) times daily.   cariprazine 3 MG capsule Commonly known as: Vraylar Take 1 capsule (3 mg total) by mouth daily.   COLLAGEN PO Take 6,000 mg by mouth daily.   cyanocobalamin 2000 MCG tablet Take 2,000 mcg by mouth daily.   estradiol 0.5 MG tablet Commonly known as: ESTRACE Take 0.5 mg by mouth daily.   ferrous gluconate 324 MG tablet Commonly known as: FERGON Take 324 mg by mouth daily with breakfast.   fluconazole 150 MG tablet Commonly known as: DIFLUCAN Take 150 mg by mouth once a week.   FLUoxetine 40 MG capsule Commonly known as: PROzac Take 1 capsule (40 mg total) by mouth daily.   furosemide 20 MG tablet Commonly known as: LASIX Take 20 mg by mouth every Monday, Wednesday, and Friday.   gabapentin 600 MG tablet Commonly known as: NEURONTIN Take 600 mg  by mouth in the morning, at noon, in the evening, and at bedtime.   insulin aspart 100 UNIT/ML injection Commonly known as: novoLOG Inject 5-8 Units into the skin 3 (three) times daily before meals.   Lantus SoloStar 100 UNIT/ML Solostar Pen Generic drug: insulin glargine Inject 44 Units into the skin daily. At hs   levocetirizine 5 MG tablet Commonly known as: XYZAL Take 5 mg by mouth every evening.   levothyroxine 175 MCG tablet Commonly known as: SYNTHROID Take 175 mcg by mouth daily before breakfast.   LORazepam 0.5 MG tablet Commonly known as: ATIVAN Take one to 2 tablets daily as needed for panic attacks.   lovastatin 40 MG tablet Commonly known as: MEVACOR Take 40 mg by mouth at bedtime.   meclizine 25 MG tablet Commonly known as: ANTIVERT Take 25 mg by mouth 3 (three) times daily as needed for dizziness.   methocarbamol 500 MG tablet Commonly known as: ROBAXIN Take 1 tablet (500 mg total) by mouth every 6 (six) hours as needed for muscle spasms.   montelukast 10 MG tablet Commonly known as: SINGULAIR Take 10 mg by mouth daily.   ondansetron 4 MG tablet Commonly known as: ZOFRAN Take 4 mg by mouth 3 (three) times daily as needed for refractory nausea / vomiting.   oxybutynin 5 MG tablet Commonly known as: DITROPAN Take 5 mg by mouth daily.   oxyCODONE 15 MG immediate release tablet Commonly known as: ROXICODONE Take 15 mg by mouth.   Ozempic (0.25 or 0.5 MG/DOSE) 2 MG/3ML Sopn Generic drug: Semaglutide(0.25 or 0.5MG /DOS) Inject 0.5 mg into the skin once a week. Fridays   pantoprazole 40 MG tablet Commonly known as: PROTONIX Take 40 mg by mouth daily.   rizatriptan 10 MG tablet Commonly known as: MAXALT Take 10 mg by mouth every 6 (six) hours as needed for migraine. May repeat in 2 hours if needed   Symbicort 80-4.5 MCG/ACT inhaler Generic drug: budesonide-formoterol Inhale into the lungs.   vitamin C 1000 MG tablet Take 1,000 mg by mouth  daily.   Vitamin D 125 MCG (5000 UT) Caps Take 2 capsules by mouth daily.   zinc gluconate 50 MG tablet Take 50 mg by mouth daily.         ROS:  A comprehensive review of systems was negative except for: Musculoskeletal: positive for right buttock pain and drainage  Blood pressure 101/68, pulse 81, temperature 98.4 F (36.9 C), temperature  source Oral, resp. rate 12, height 5\' 3"  (1.6 m), weight 212 lb (96.2 kg), SpO2 94%. Physical Exam Vitals reviewed.  HENT:     Head: Normocephalic.     Nose: Nose normal.  Eyes:     Extraocular Movements: Extraocular movements intact.  Cardiovascular:     Rate and Rhythm: Normal rate and regular rhythm.  Pulmonary:     Effort: Pulmonary effort is normal.     Breath sounds: Normal breath sounds.  Abdominal:     General: There is no distension.     Palpations: Abdomen is soft.  Musculoskeletal:        General: Normal range of motion.     Comments: Right buttock wound with medial aspect adipose with necrosis and some drainage  Skin:    General: Skin is warm.  Neurological:     General: No focal deficit present.     Mental Status: She is alert and oriented to person, place, and time.  Psychiatric:        Mood and Affect: Mood normal.        Behavior: Behavior normal.     Results: None   Assessment & Plan:  DEVANIE WAGERS is a 67 y.o. female with a right buttock pressure wound. We have exhausted the ability to do debridement under local and I need to do some debridement with anesthesia so I can get this deep track that continues to have necrosis.   -Discussed excisional debridement, risk of bleeding, infection, larger wound, packing.    All questions were answered to the satisfaction of the patient and family.  Algis Greenhouse, MD [facility] [address] [place], [place] [address] [phone] (office)

## 2023-02-20 ENCOUNTER — Ambulatory Visit (HOSPITAL_COMMUNITY)
Admission: RE | Admit: 2023-02-20 | Discharge: 2023-02-20 | Disposition: A | Discharge status: Home / Self Care | Payer: Medicare HMO | Attending: General Surgery | Admitting: General Surgery

## 2023-02-20 ENCOUNTER — Ambulatory Visit (HOSPITAL_COMMUNITY): Payer: Self-pay | Admitting: Certified Registered"

## 2023-02-20 ENCOUNTER — Encounter (HOSPITAL_COMMUNITY)
Admission: RE | Disposition: A | Discharge status: Home / Self Care | Payer: Self-pay | Source: Home / Self Care | Attending: General Surgery

## 2023-02-20 ENCOUNTER — Ambulatory Visit (HOSPITAL_BASED_OUTPATIENT_CLINIC_OR_DEPARTMENT_OTHER): Payer: Medicare HMO | Admitting: Certified Registered"

## 2023-02-20 ENCOUNTER — Encounter (HOSPITAL_COMMUNITY): Payer: Self-pay | Admitting: General Surgery

## 2023-02-20 DIAGNOSIS — E039 Hypothyroidism, unspecified: Secondary | ICD-10-CM | POA: Diagnosis not present

## 2023-02-20 DIAGNOSIS — L89319 Pressure ulcer of right buttock, unspecified stage: Secondary | ICD-10-CM | POA: Insufficient documentation

## 2023-02-20 DIAGNOSIS — Z7985 Long-term (current) use of injectable non-insulin antidiabetic drugs: Secondary | ICD-10-CM | POA: Insufficient documentation

## 2023-02-20 DIAGNOSIS — I10 Essential (primary) hypertension: Secondary | ICD-10-CM

## 2023-02-20 DIAGNOSIS — Z794 Long term (current) use of insulin: Secondary | ICD-10-CM | POA: Insufficient documentation

## 2023-02-20 DIAGNOSIS — J45909 Unspecified asthma, uncomplicated: Secondary | ICD-10-CM

## 2023-02-20 DIAGNOSIS — F419 Anxiety disorder, unspecified: Secondary | ICD-10-CM | POA: Insufficient documentation

## 2023-02-20 DIAGNOSIS — D509 Iron deficiency anemia, unspecified: Secondary | ICD-10-CM

## 2023-02-20 DIAGNOSIS — Z87891 Personal history of nicotine dependence: Secondary | ICD-10-CM | POA: Diagnosis not present

## 2023-02-20 DIAGNOSIS — F32A Depression, unspecified: Secondary | ICD-10-CM | POA: Diagnosis not present

## 2023-02-20 DIAGNOSIS — E11622 Type 2 diabetes mellitus with other skin ulcer: Secondary | ICD-10-CM | POA: Insufficient documentation

## 2023-02-20 DIAGNOSIS — L89300 Pressure ulcer of unspecified buttock, unstageable: Secondary | ICD-10-CM

## 2023-02-20 DIAGNOSIS — L89310 Pressure ulcer of right buttock, unstageable: Secondary | ICD-10-CM | POA: Diagnosis not present

## 2023-02-20 DIAGNOSIS — S31819A Unspecified open wound of right buttock, initial encounter: Secondary | ICD-10-CM

## 2023-02-20 HISTORY — PX: INCISION AND DRAINAGE OF WOUND: SHX1803

## 2023-02-20 LAB — GLUCOSE, CAPILLARY: Glucose-Capillary: 121 mg/dL — ABNORMAL HIGH (ref 70–99)

## 2023-02-20 SURGERY — IRRIGATION AND DEBRIDEMENT WOUND
Anesthesia: General | Site: Buttocks | Laterality: Right

## 2023-02-20 MED ORDER — DEXMEDETOMIDINE HCL IN NACL 80 MCG/20ML IV SOLN
INTRAVENOUS | Status: DC | PRN
  Administered 2023-02-20: 8 ug via INTRAVENOUS

## 2023-02-20 MED ORDER — LIDOCAINE HCL (CARDIAC) PF 100 MG/5ML IV SOSY
PREFILLED_SYRINGE | INTRAVENOUS | Status: DC | PRN
  Administered 2023-02-20: 60 mg via INTRAVENOUS

## 2023-02-20 MED ORDER — FENTANYL CITRATE (PF) 100 MCG/2ML IJ SOLN
INTRAMUSCULAR | Status: AC
  Filled 2023-02-20: qty 2

## 2023-02-20 MED ORDER — CHLORHEXIDINE GLUCONATE 0.12 % MT SOLN
15.0000 mL | Freq: Once | OROMUCOSAL | Status: AC
  Administered 2023-02-20: 15 mL via OROMUCOSAL
  Filled 2023-02-20: qty 15

## 2023-02-20 MED ORDER — CHLORHEXIDINE GLUCONATE CLOTH 2 % EX PADS: 6.0000 | MEDICATED_PAD | Freq: Once | CUTANEOUS | Status: DC

## 2023-02-20 MED ORDER — LIDOCAINE HCL (PF) 1 % IJ SOLN
INTRAMUSCULAR | Status: AC
  Filled 2023-02-20: qty 30

## 2023-02-20 MED ORDER — OXYCODONE HCL 5 MG PO TABS
5.0000 mg | ORAL_TABLET | ORAL | 0 refills | Status: DC | PRN
Start: 2023-02-20 — End: 2023-04-04

## 2023-02-20 MED ORDER — FENTANYL CITRATE (PF) 100 MCG/2ML IJ SOLN
INTRAMUSCULAR | Status: DC | PRN
  Administered 2023-02-20: 25 ug via INTRAVENOUS
  Administered 2023-02-20: 50 ug via INTRAVENOUS
  Administered 2023-02-20: 25 ug via INTRAVENOUS

## 2023-02-20 MED ORDER — PROPOFOL 10 MG/ML IV BOLUS
INTRAVENOUS | Status: AC
  Filled 2023-02-20: qty 20

## 2023-02-20 MED ORDER — ONDANSETRON HCL 4 MG/2ML IJ SOLN
INTRAMUSCULAR | Status: DC | PRN
  Administered 2023-02-20: 4 mg via INTRAVENOUS

## 2023-02-20 MED ORDER — FENTANYL CITRATE PF 50 MCG/ML IJ SOSY
PREFILLED_SYRINGE | INTRAMUSCULAR | Status: AC
  Filled 2023-02-20: qty 1

## 2023-02-20 MED ORDER — PROPOFOL 500 MG/50ML IV EMUL
INTRAVENOUS | Status: AC
  Filled 2023-02-20: qty 50

## 2023-02-20 MED ORDER — CHLORHEXIDINE GLUCONATE 0.12 % MT SOLN: 15.0000 mL | Freq: Once | OROMUCOSAL | Status: DC

## 2023-02-20 MED ORDER — 0.9 % SODIUM CHLORIDE (POUR BTL) OPTIME
TOPICAL | Status: DC | PRN
  Administered 2023-02-20: 1000 mL

## 2023-02-20 MED ORDER — PROPOFOL 500 MG/50ML IV EMUL
INTRAVENOUS | Status: DC | PRN
  Administered 2023-02-20: 70 ug/kg/min via INTRAVENOUS
  Administered 2023-02-20: 150 ug/kg/min via INTRAVENOUS

## 2023-02-20 MED ORDER — ONDANSETRON HCL 4 MG/2ML IJ SOLN
INTRAMUSCULAR | Status: AC
  Filled 2023-02-20: qty 2

## 2023-02-20 MED ORDER — PROPOFOL 10 MG/ML IV BOLUS
INTRAVENOUS | Status: DC | PRN
  Administered 2023-02-20: 20 mg via INTRAVENOUS

## 2023-02-20 MED ORDER — ORAL CARE MOUTH RINSE: 15.0000 mL | Freq: Once | OROMUCOSAL | Status: DC

## 2023-02-20 MED ORDER — FENTANYL CITRATE PF 50 MCG/ML IJ SOSY
25.0000 ug | PREFILLED_SYRINGE | INTRAMUSCULAR | Status: DC | PRN
  Administered 2023-02-20 (×2): 50 ug via INTRAVENOUS
  Filled 2023-02-20: qty 1

## 2023-02-20 MED ORDER — OXYCODONE HCL 5 MG PO TABS
5.0000 mg | ORAL_TABLET | Freq: Once | ORAL | Status: AC | PRN
  Administered 2023-02-20: 5 mg via ORAL
  Filled 2023-02-20: qty 1

## 2023-02-20 MED ORDER — LIDOCAINE HCL (PF) 2 % IJ SOLN
INTRAMUSCULAR | Status: AC
  Filled 2023-02-20: qty 5

## 2023-02-20 MED ORDER — OXYCODONE HCL 5 MG/5ML PO SOLN: 5.0000 mg | Freq: Once | ORAL | Status: AC | PRN

## 2023-02-20 MED ORDER — LACTATED RINGERS IV SOLN: INTRAVENOUS | Status: DC

## 2023-02-20 MED ORDER — ONDANSETRON HCL 4 MG/2ML IJ SOLN: 4.0000 mg | Freq: Once | INTRAMUSCULAR | Status: DC | PRN

## 2023-02-20 MED ORDER — LIDOCAINE HCL (PF) 1 % IJ SOLN
INTRAMUSCULAR | Status: DC | PRN
Start: 2023-02-20 — End: 2023-02-20
  Administered 2023-02-20: 30 mL

## 2023-02-20 SURGICAL SUPPLY — 21 items
BNDG GAUZE DERMACEA FLUFF 4 (GAUZE/BANDAGES/DRESSINGS) IMPLANT
BNDG GZE DERMACEA 4 6PLY (GAUZE/BANDAGES/DRESSINGS) ×1
CLOTH BEACON ORANGE TIMEOUT ST (SAFETY) ×1 IMPLANT
COVER LIGHT HANDLE STERIS (MISCELLANEOUS) ×2 IMPLANT
ELECT REM PT RETURN 9FT ADLT (ELECTROSURGICAL) ×1
ELECTRODE REM PT RTRN 9FT ADLT (ELECTROSURGICAL) ×1 IMPLANT
GLOVE BIO SURGEON STRL SZ 6.5 (GLOVE) ×1 IMPLANT
GLOVE BIO SURGEON STRL SZ7.5 (GLOVE) IMPLANT
GLOVE BIOGEL PI IND STRL 6.5 (GLOVE) ×1 IMPLANT
GLOVE BIOGEL PI IND STRL 7.0 (GLOVE) ×2 IMPLANT
GLOVE BIOGEL PI IND STRL 7.5 (GLOVE) IMPLANT
GOWN STRL REUS W/TWL LRG LVL3 (GOWN DISPOSABLE) ×2 IMPLANT
KIT TURNOVER KIT A (KITS) ×1 IMPLANT
MANIFOLD NEPTUNE II (INSTRUMENTS) ×1 IMPLANT
NS IRRIG 1000ML POUR BTL (IV SOLUTION) ×1 IMPLANT
PACK MINOR (CUSTOM PROCEDURE TRAY) IMPLANT
PAD ABD 5X9 TENDERSORB (GAUZE/BANDAGES/DRESSINGS) IMPLANT
PAD ARMBOARD 7.5X6 YLW CONV (MISCELLANEOUS) ×1 IMPLANT
POSITIONER HEAD 8X9X4 ADT (SOFTGOODS) ×1 IMPLANT
SET BASIN LINEN APH (SET/KITS/TRAYS/PACK) ×1 IMPLANT
SWAB CULTURE ESWAB REG 1ML (MISCELLANEOUS) ×1 IMPLANT

## 2023-02-20 NOTE — Anesthesia Procedure Notes (Signed)
Date/Time: 02/20/2023 12:25 PM  Performed by: Franco Nones, CRNAPre-anesthesia Checklist: Patient identified, Emergency Drugs available, Suction available, Timeout performed and Patient being monitored Patient Re-evaluated:Patient Re-evaluated prior to induction Oxygen Delivery Method: Nasal Cannula

## 2023-02-20 NOTE — Anesthesia Postprocedure Evaluation (Signed)
Anesthesia Post Note  Patient: [name]  Procedure(s) Performed: IRRIGATION AND DEBRIDEMENT BUTTOCKS (Right: Buttocks)  Patient location during evaluation: Phase II Anesthesia Type: General Level of consciousness: awake Pain management: pain level controlled Vital Signs Assessment: post-procedure vital signs reviewed and stable Respiratory status: spontaneous breathing and respiratory function stable Cardiovascular status: blood pressure returned to baseline and stable Postop Assessment: no headache and no apparent nausea or vomiting Anesthetic complications: no Comments: Late entry   No notable events documented.   Last Vitals:  Vitals:   02/20/23 1430 02/20/23 1448  BP: 99/71 110/70  Pulse: 70 72  Resp: 11 16  Temp:  36.7 C  SpO2: 97% 98%    Last Pain:  Vitals:   02/20/23 1448  TempSrc: Oral  PainSc: 0-No pain                 [name]

## 2023-02-20 NOTE — Op Note (Signed)
[facility] Operative Note  02/20/23  Preoperative Diagnosis:  Right buttock pressure wound    Postoperative Diagnosis: Same   Procedure(s) Performed: Excisional debridement right buttock wound, 3X4X3cm    Surgeon: Leatrice Jewels. Henreitta Leber, MD   Assistants: No qualified resident was available    Anesthesia: General endotracheal   Anesthesiologist: [name], MD    Specimens: Right buttock pressure wound tissue and culture    Estimated Blood Loss: Minimal   Blood Replacement: None    Complications: None   Wound Class: Dirty infected    Operative Indications: Ms. [name] is a 67 yo who has had a pressure wound after another procedure and has had local excisions in the office but has an area in the superior medial aspect that has worsening fat necrosis and needs to be cleaned up and debrided under anesthesia. We discussed risk of bleeding, infection, wound care, and pain.   Findings: Necrotic fat and skin in the upper medial portion, excised and no other tracking noted, 3X4X3cm area    Procedure: The patient was taken to the operating room and placed supine. Monitored anesthesia was induced. Intravenous antibiotics were noted administered as I took a culture of the deep wound. The right buttock wound was prepped and draped in the usual sterile fashion.   I used excisional sharp debridement of the skin and adipose tissue with scissors and a scalpel to remove 3X4X3cm of tissue in that superior medial aspect of the wound that was not granulating. I felt for any tracks or tunnels and the remaining tissue was healthy, bleeding and did not show any tracks or tunnels. I used cautery for hemostasis and packed the wound with saline dampened kerlix and covered with ABD and papertape and mesh underwear.   Final inspection revealed acceptable hemostasis. All counts were correct at the end of the case. The patient was awakened from anesthesia without complication.  The patient  went to the PACU in stable condition.   Algis Greenhouse, MD [facility] [address] [place], [place] [address] [phone] (office)

## 2023-02-20 NOTE — Interval H&P Note (Signed)
History and Physical Interval Note:  02/20/2023 11:51 AM  [name]  has presented today for surgery, with the diagnosis of Right buttock wound.  The various methods of treatment have been discussed with the patient and family. After consideration of risks, benefits and other options for treatment, the patient has consented to  Procedure(s): IRRIGATION AND DEBRIDEMENT BUTTOCKS (Right) as a surgical intervention.  The patient's history has been reviewed, patient examined, no change in status, stable for surgery.  I have reviewed the patient's chart and labs.  Questions were answered to the patient's satisfaction.     Lucretia Roers

## 2023-02-20 NOTE — Transfer of Care (Signed)
Immediate Anesthesia Transfer of Care Note  Patient: [name]  Procedure(s) Performed: IRRIGATION AND DEBRIDEMENT BUTTOCKS (Right: Buttocks)  Patient Location: PACU  Anesthesia Type:General  Level of Consciousness: awake and patient cooperative  Airway & Oxygen Therapy: Patient Spontanous Breathing  Post-op Assessment: Report given to RN and Post -op Vital signs reviewed and stable  Post vital signs: Reviewed and stable  Last Vitals:  Vitals Value Taken Time  BP 102/67 02/20/23  1335  Temp 98 02/20/23  1335  Pulse 74 02/20/23  1335  Resp 11 02/20/23  1335  SpO2 98 02/20/23  1335    Last Pain:  Vitals:   02/20/23 1121  PainSc: 7       Patients Stated Pain Goal: 6 (02/20/23 1121)  Complications: No notable events documented.

## 2023-02-20 NOTE — Progress Notes (Signed)
[facility]  Updated family. Continue dressing changes. Will rx roxicodone 5 mg q4 PRN # 10 tablets for breakthrough pain to get her to when her pain medication increases with her chronic pain doctor Wednesday per her report.  Will notify Teresa Coombs of this one time addition.  ] Algis Greenhouse, MD [facility] [address] [place], [place] [address] [phone] (office)

## 2023-02-20 NOTE — Anesthesia Preprocedure Evaluation (Signed)
Anesthesia Evaluation  Patient identified by MRN, date of birth, ID band Patient awake    Reviewed: Allergy & Precautions, H&P , NPO status , Patient's Chart, lab work & pertinent test results, reviewed documented beta blocker date and time   History of Anesthesia Complications (+) PONV and history of anesthetic complications  Airway Mallampati: II  TM Distance: >3 FB Neck ROM: full    Dental no notable dental hx.    Pulmonary neg pulmonary ROS, asthma , former smoker   Pulmonary exam normal breath sounds clear to auscultation       Cardiovascular Exercise Tolerance: Good hypertension, negative cardio ROS  Rhythm:regular Rate:Normal     Neuro/Psych  Headaches PSYCHIATRIC DISORDERS Anxiety Depression     Neuromuscular disease negative neurological ROS  negative psych ROS   GI/Hepatic negative GI ROS, Neg liver ROS,GERD  ,,  Endo/Other  negative endocrine ROSdiabetesHypothyroidism    Renal/GU negative Renal ROS  negative genitourinary   Musculoskeletal   Abdominal   Peds  Hematology negative hematology ROS (+) Blood dyscrasia, anemia   Anesthesia Other Findings   Reproductive/Obstetrics negative OB ROS                             Anesthesia Physical Anesthesia Plan  ASA: 3  Anesthesia Plan: General   Post-op Pain Management:    Induction:   PONV Risk Score and Plan: Propofol infusion  Airway Management Planned:   Additional Equipment:   Intra-op Plan:   Post-operative Plan:   Informed Consent: I have reviewed the patients History and Physical, chart, labs and discussed the procedure including the risks, benefits and alternatives for the proposed anesthesia with the patient or authorized representative who has indicated his/her understanding and acceptance.     Dental Advisory Given  Plan Discussed with: CRNA  Anesthesia Plan Comments:        Anesthesia Quick  Evaluation

## 2023-02-20 NOTE — Discharge Instructions (Signed)
Continue to pack the wound at least daily starting tomorrow to at most 2-3 times daily with saline dampened gauze (kerlix) and cover with pad and papertape.  Expect some minor bleeding, hold pressure with tight packing if bleeding, can use saline on the wound while removing packing if needed.  Can shower but do not submerge the area.   Take pain medication for breakthrough pain.

## 2023-02-21 ENCOUNTER — Telehealth: Payer: Self-pay | Admitting: *Deleted

## 2023-02-21 ENCOUNTER — Encounter (HOSPITAL_COMMUNITY): Payer: Self-pay | Admitting: General Surgery

## 2023-02-21 NOTE — Telephone Encounter (Signed)
Call placed to [facility] and spoke with Pattricia Boss, representative.   Made aware of prescription from Dr. Henreitta Leber.   Requested written document for records.

## 2023-02-21 NOTE — Telephone Encounter (Signed)
-----   Message from Nurse Lynnette Caffey sent at 02/20/2023  3:58 PM EDT ----- Regarding: [facility]  can you let [name] know that I prescribed her 10 tablets of  rxocidone 5 for breakthrough pain due to the debridement      [facility] Physician Provider Summary  Title Provider type MD Physician Primary Contact Information   [phone] telephone [phone] fax

## 2023-02-22 LAB — SURGICAL PATHOLOGY

## 2023-02-25 LAB — AEROBIC/ANAEROBIC CULTURE W GRAM STAIN (SURGICAL/DEEP WOUND): Gram Stain: NONE SEEN

## 2023-03-01 ENCOUNTER — Telehealth: Payer: Medicare HMO | Admitting: Adult Health

## 2023-03-01 ENCOUNTER — Encounter: Payer: Self-pay | Admitting: Adult Health

## 2023-03-01 DIAGNOSIS — F4310 Post-traumatic stress disorder, unspecified: Secondary | ICD-10-CM

## 2023-03-01 DIAGNOSIS — F331 Major depressive disorder, recurrent, moderate: Secondary | ICD-10-CM | POA: Diagnosis not present

## 2023-03-01 DIAGNOSIS — F410 Panic disorder [episodic paroxysmal anxiety] without agoraphobia: Secondary | ICD-10-CM | POA: Diagnosis not present

## 2023-03-01 DIAGNOSIS — G4700 Insomnia, unspecified: Secondary | ICD-10-CM

## 2023-03-01 DIAGNOSIS — F411 Generalized anxiety disorder: Secondary | ICD-10-CM

## 2023-03-01 MED ORDER — LAMOTRIGINE 25 MG PO TABS: ORAL_TABLET | ORAL | 2 refills | Status: DC

## 2023-03-01 NOTE — Progress Notes (Signed)
[name] [id number] [birth year] 67 y.o.  Virtual Visit via Video Note  I connected with pt @ on 03/01/23 at  2:00 PM EDT by a video enabled telemedicine application and verified that I am speaking with the correct person using two identifiers.   I discussed the limitations of evaluation and management by telemedicine and the availability of in person appointments. The patient expressed understanding and agreed to proceed.  I discussed the assessment and treatment plan with the patient. The patient was provided an opportunity to ask questions and all were answered. The patient agreed with the plan and demonstrated an understanding of the instructions.   The patient was advised to call back or seek an in-person evaluation if the symptoms worsen or if the condition fails to improve as anticipated.  I provided 25 minutes of non-face-to-face time during this encounter. The patient was located at home.  The provider was located at [facility].   Dorothyann Gibbs, NP   Subjective:   Patient ID:  [name] is a 67 y.o. (DOB Dec 03, 1955) female.  Chief Complaint: No chief complaint on file.   [name], [name], [name], insomnia, and panic attacks.  Describes mood today as "ok". Pleasant. Tearful at times. Mood symptoms - reports increased depression and anxiety - "episodes". Reports irritability. Reports recent panic attack - accident. Reports worry, rumination, and over thinking. Mood is lower. Stating "I'm not doing too good". Reports multiple situational stressors - health and accident. Feels like current medication regimen is helpful. Reports lower interest and motivation - "having to make myself do things". Taking medications as prescribed. Followed for multiple medical issues. Energy levels lower. Active, does not have a regular exercise routine with physical disabilities. Unable to enjoy usual interests and activities. Divorced from  husband of 40 years, but lives with ex-husband and his mother in law - 83 years old. Has a dog yorkie - "[name]". Has 3 grown children. Spending time with family. Appetite adequate. Weight stable - 212 pounds - 63". A1C 6.2 - Ozempic. Sleeping well most nights. Averages 6 to 8 hours. Reports focus and concentration difficulties. Completing tasks. Managing aspects of household. 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  Denies AH or VH. Denies self harm. Denies substance abuse. Reporting some "paranoia" when in crowded spaces.   Chronic pain - working with pain management.  Reports tremors in her thumbs. Seeing Neurologist next month.  Seeing Dr. Lucia Gaskins - managing migraines  Previous medication trials: Cymbalta, Prozac  Review of Systems:  Review of Systems  Musculoskeletal:  Negative for gait problem.  Neurological:  Negative for tremors.  Psychiatric/Behavioral:         Please refer to HPI    Medications: I have reviewed the patient's current medications.  Current Outpatient Medications  Medication Sig Dispense Refill   lamoTRIgine (LAMICTAL) 25 MG tablet Take one tablet at bedtime for 14 days, then increase to two tablets at bedtime. 60 tablet 2   albuterol (VENTOLIN HFA) 108 (90 Base) MCG/ACT inhaler Inhale into the lungs.     Ascorbic Acid (VITAMIN C) 1000 MG tablet Take 1,000 mg by mouth daily.     buPROPion (WELLBUTRIN SR) 150 MG 12 hr tablet Take 1 tablet (150 mg total) by mouth 2 (two) times daily. 180 tablet 3   busPIRone (BUSPAR) 10 MG tablet Take 1.5 tablets (15 mg total) by mouth 2 (two) times daily. 60 tablet  5   cariprazine (VRAYLAR) 3 MG capsule Take 1 capsule (3 mg total) by mouth daily. 30 capsule 2   Cholecalciferol (VITAMIN D) 125 MCG (5000 UT) CAPS Take 2 capsules by mouth daily.     COLLAGEN PO Take 6,000 mg by mouth daily.     cyanocobalamin 2000 MCG tablet Take 2,000 mcg by mouth daily.     estradiol (ESTRACE) 0.5 MG tablet Take  0.5 mg by mouth daily.     ferrous gluconate (FERGON) 324 MG tablet Take 324 mg by mouth daily with breakfast.     fluconazole (DIFLUCAN) 150 MG tablet Take 150 mg by mouth once a week.     FLUoxetine (PROZAC) 40 MG capsule Take 1 capsule (40 mg total) by mouth daily. 90 capsule 3   furosemide (LASIX) 20 MG tablet Take 20 mg by mouth every Monday, Wednesday, and Friday.     gabapentin (NEURONTIN) 600 MG tablet Take 600 mg by mouth in the morning, at noon, in the evening, and at bedtime.     insulin aspart (NOVOLOG) 100 UNIT/ML injection Inject 5-8 Units into the skin 3 (three) times daily before meals.     LANTUS SOLOSTAR 100 UNIT/ML Solostar Pen Inject 44 Units into the skin daily. At hs     levocetirizine (XYZAL) 5 MG tablet Take 5 mg by mouth every evening.     levothyroxine (SYNTHROID) 175 MCG tablet Take 175 mcg by mouth daily before breakfast.     LORazepam (ATIVAN) 0.5 MG tablet Take one to 2 tablets daily as needed for panic attacks. 60 tablet 2   lovastatin (MEVACOR) 40 MG tablet Take 40 mg by mouth at bedtime.     meclizine (ANTIVERT) 25 MG tablet Take 25 mg by mouth 3 (three) times daily as needed for dizziness.     methocarbamol (ROBAXIN) 500 MG tablet Take 1 tablet (500 mg total) by mouth every 6 (six) hours as needed for muscle spasms. 40 tablet 0   montelukast (SINGULAIR) 10 MG tablet Take 10 mg by mouth daily.     ondansetron (ZOFRAN) 4 MG tablet Take 4 mg by mouth 3 (three) times daily as needed for refractory nausea / vomiting.     oxybutynin (DITROPAN) 5 MG tablet Take 5 mg by mouth daily.     oxyCODONE (ROXICODONE) 15 MG immediate release tablet Take 15 mg by mouth.     oxyCODONE (ROXICODONE) 5 MG immediate release tablet Take 1 tablet (5 mg total) by mouth every 4 (four) hours as needed for severe pain or breakthrough pain. 10 tablet 0   OZEMPIC, 0.25 OR 0.5 MG/DOSE, 2 MG/3ML SOPN Inject 0.5 mg into the skin once a week. Fridays     pantoprazole (PROTONIX) 40 MG tablet Take  40 mg by mouth daily.     rizatriptan (MAXALT) 10 MG tablet Take 10 mg by mouth every 6 (six) hours as needed for migraine. May repeat in 2 hours if needed     SYMBICORT 80-4.5 MCG/ACT inhaler Inhale into the lungs.     zinc gluconate 50 MG tablet Take 50 mg by mouth daily.     Current Facility-Administered Medications  Medication Dose Route Frequency Provider Last Rate Last Admin   Fremanezumab-vfrm SOSY 225 mg  225 mg Subcutaneous Once Anson Fret, MD        Medication Side Effects: None  Allergies:  Allergies  Allergen Reactions   Hydromorphone Other (See Comments) and Shortness Of Breath    Respiratory arrest per patient  Other reaction(s): coded with fast admin  Note: Imported from external source.   Penicillins Anaphylaxis and Other (See Comments)   Erythromycin Base Nausea And Vomiting   Ibuprofen Other (See Comments)    Per pt, it makes her bleed   Liraglutide    Meperidine Hives   Meperidine Hcl Other (See Comments)   Nsaids Other (See Comments)    GI bleed     Past Medical History:  Diagnosis Date   Anxiety    Asthma    Chronic pain    Depression    Diabetes mellitus without complication (HCC)    Fibromyalgia    GERD (gastroesophageal reflux disease)    Headache    migraines   Hyperlipidemia    Hypothyroidism    Osteoarthritis    PONV (postoperative nausea and vomiting)     No family history on file.  Social History   Socioeconomic History   Marital status: Divorced    Spouse name: Not on file   Number of children: Not on file   Years of education: Not on file   Highest education level: Not on file  Occupational History   Not on file  Tobacco Use   Smoking status: Former    Types: Cigarettes    Passive exposure: Never   Smokeless tobacco: Never  Vaping Use   Vaping status: Never Used  Substance and Sexual Activity   Alcohol use: Never   Drug use: Never   Sexual activity: Not Currently    Birth control/protection: None  Other  Topics Concern   Not on file  Social History Narrative   Caffiene 1-2 cups daily.   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    Social Determinants of Health   Financial Resource Strain: Not on file  Food Insecurity: No Food Insecurity (11/28/2022)   Hunger Vital Sign    Worried About Running Out of Food in the Last Year: Never true    Ran Out of Food in the Last Year: Never true  Transportation Needs: No Transportation Needs (11/28/2022)   PRAPARE - Administrator, Civil Service (Medical): No    Lack of Transportation (Non-Medical): No  Physical Activity: Not on file  Stress: Not on file  Social Connections: Not on file  Intimate Partner Violence: Not At Risk (11/28/2022)   Humiliation, Afraid, Rape, and Kick questionnaire    Fear of Current or Ex-Partner: No    Emotionally Abused: No    Physically Abused: No    Sexually Abused: No    Past Medical History, Surgical history, Social history, and Family history were reviewed and updated as appropriate.   Please see review of systems for further details on the patient's review from today.   Objective:   Physical Exam:  There were no vitals taken for this visit.  Physical Exam Constitutional:      General: She is not in acute distress. Musculoskeletal:        General: No deformity.  Neurological:     Mental Status: She is alert and oriented to person, place, and time.     Coordination: Coordination normal.  Psychiatric:        Attention and Perception: Attention and perception normal. She does not perceive auditory or visual hallucinations.        Mood and Affect: Mood normal. Mood is not anxious or depressed. Affect is not labile, blunt, angry or inappropriate.  Speech: Speech normal.        Behavior: Behavior normal.        Thought Content: Thought content normal. Thought content is not paranoid or delusional. Thought content does not include homicidal or suicidal ideation.  Thought content does not include homicidal or suicidal plan.        Cognition and Memory: Cognition and memory normal.        Judgment: Judgment normal.     Comments: Insight intact     Lab Review:     Component Value Date/Time   NA 140 02/17/2023 1005   K 4.3 02/17/2023 1005   CL 100 02/17/2023 1005   CO2 30 02/17/2023 1005   GLUCOSE 149 (H) 02/17/2023 1005   BUN 12 02/17/2023 1005   CREATININE 0.88 02/17/2023 1005   CALCIUM 9.2 02/17/2023 1005   PROT 5.8 (L) 12/23/2021 0527   ALBUMIN 2.8 (L) 12/23/2021 0527   AST 16 12/23/2021 0527   ALT 15 12/23/2021 0527   ALKPHOS 54 12/23/2021 0527   BILITOT 0.6 12/23/2021 0527   GFRNONAA >60 02/17/2023 1005       Component Value Date/Time   WBC 8.5 02/17/2023 1005   RBC 4.65 02/17/2023 1005   HGB 12.9 02/17/2023 1005   HCT 43.0 02/17/2023 1005   PLT 245 02/17/2023 1005   MCV 92.5 02/17/2023 1005   MCH 27.7 02/17/2023 1005   MCHC 30.0 02/17/2023 1005   RDW 12.8 02/17/2023 1005   LYMPHSABS 1.5 02/17/2023 1005   MONOABS 0.5 02/17/2023 1005   EOSABS 0.1 02/17/2023 1005   BASOSABS 0.1 02/17/2023 1005    No results found for: "POCLITH", "LITHIUM"   No results found for: "PHENYTOIN", "PHENOBARB", "VALPROATE", "CBMZ"   .res Assessment: Plan:    Plan:  PDMP reviewed  Add Lamictal 25mg  daily x 14 days, then increase to 50mg  daily  Buspar 15mg  BID - PCP Wellbutrin SR 150mg  BID - PCP  Prozac 40mg  daily  Trazadone 100mg  at bedtime Ativan 0.5mg  BID daily prn as needed - hasn't taken in a month  Vraylar 3mg  daily - patient assistance approved. Patient reporting tremor - will monitor over next 4 weeks - may need to reduce dose.  Time spent with patient was 25 minutes. Greater than 50% of face to face time with patient was spent on counseling and coordination of care.    RTC 4 weeks  Patient advised to contact office with any questions, adverse effects, or acute worsening in signs and symptoms.   Discussed potential  benefits, risk, and side effects of benzodiazepines to include potential risk of tolerance and dependence, as well as possible drowsiness. Advised patient not to drive if experiencing drowsiness and to take lowest possible effective dose to minimize risk of dependence and tolerance.   Diagnoses and all orders for this visit:  Major depressive disorder, recurrent episode, moderate (HCC) -     lamoTRIgine (LAMICTAL) 25 MG tablet; Take one tablet at bedtime for 14 days, then increase to two tablets at bedtime.  Generalized anxiety disorder  Panic attacks  [name] (post-traumatic stress disorder)  Insomnia, unspecified type     Please see After Visit Summary for patient specific instructions.  Future Appointments  Date Time Provider Department Center  03/02/2023  9:00 AM Lucretia Roers, MD [facility] None  04/04/2023  1:15 PM Glean Salvo, NP [facility] None    No orders of the defined types were placed in this encounter.     -------------------------------

## 2023-03-02 ENCOUNTER — Ambulatory Visit (INDEPENDENT_AMBULATORY_CARE_PROVIDER_SITE_OTHER): Payer: Medicare HMO | Admitting: General Surgery

## 2023-03-02 ENCOUNTER — Encounter: Payer: Self-pay | Admitting: General Surgery

## 2023-03-02 VITALS — BP 102/68 | HR 78 | Temp 98.0°F | Resp 12 | Ht 63.0 in | Wt 210.0 lb

## 2023-03-02 DIAGNOSIS — L89310 Pressure ulcer of right buttock, unstageable: Secondary | ICD-10-CM

## 2023-03-02 MED ORDER — SANTYL 250 UNIT/GM EX OINT: 1.0000 | TOPICAL_OINTMENT | Freq: Every day | CUTANEOUS | 0 refills | Status: DC

## 2023-03-02 NOTE — Progress Notes (Signed)
[facility]  Deepest part of wound is sloughing/ necrosing again she reports.  BP 102/68   Pulse 78   Temp 98 F (36.7 C) (Oral)   Resp 12   Ht 5\' 3"  (1.6 m)   Wt 210 lb (95.3 kg)   SpO2 92%   BMI 37.20 kg/m  Granulation great on upper aspect of right buttock wound, deeper portion down to the sacrum with black/ yellow slough, trimmed out repacked  Patient with partially healing and partially worsening right buttock pressure sore.   Off load onto your left buttock/ use a foam pillow. Continue packing 2-3 times a day. Once a day place santyl in the deep part of wound where tissue is sloughing off/ turning dark brown.  Santyl Rx given to her so they can figure out cheapest place to get it  Future Appointments  Date Time Provider Department Center  03/07/2023  9:45 AM Lucretia Roers, MD [facility] None  04/04/2023  1:15 PM Glean Salvo, NP [facility] None   Algis Greenhouse, MD [facility] [address] [place] [place], [place] [address] [phone] (office)

## 2023-03-02 NOTE — Patient Instructions (Signed)
Off load onto your left buttock/ use a foam pillow. Continue packing 2-3 times a day. Once a day place santyl in the deep part of wound where tissue is sloughing off/ turning dark brown.

## 2023-03-07 ENCOUNTER — Encounter: Payer: Medicare HMO | Admitting: General Surgery

## 2023-03-09 ENCOUNTER — Ambulatory Visit (INDEPENDENT_AMBULATORY_CARE_PROVIDER_SITE_OTHER): Payer: Medicare HMO | Admitting: General Surgery

## 2023-03-09 ENCOUNTER — Encounter: Payer: Self-pay | Admitting: General Surgery

## 2023-03-09 VITALS — BP 100/69 | HR 79 | Temp 98.1°F | Ht 63.0 in | Wt 209.0 lb

## 2023-03-09 DIAGNOSIS — L89310 Pressure ulcer of right buttock, unstageable: Secondary | ICD-10-CM

## 2023-03-09 NOTE — Progress Notes (Signed)
[facility]  Area looking better but she could not afford the santyl. She says it was going to be over $300 for the small tube.   BP 100/69   Pulse 79   Temp 98.1 F (36.7 C) (Oral)   Ht 5\' 3"  (1.6 m)   Wt 209 lb (94.8 kg)   SpO2 93%   BMI 37.02 kg/m  Granulation on the superficial area, deep with some necrotic fibrinous tissue. Silver nitrate applied and small debridement performed  Patient s/p excisional debridement of the pressure wound. She is healing but part of the area is still have this necrotic fibrinous sloughing at the base.   Do silver nitrate to the area once daily (3 sticks each time). Pack with saline dampened gauze. Will see you next week.   Future Appointments  Date Time Provider Department Center  03/15/2023  2:30 PM Lucretia Roers, MD [facility] None  04/04/2023  1:15 PM Glean Salvo, NP [facility] None   Algis Greenhouse, MD [facility] [address] [place], [place] [address] [phone] (office)

## 2023-03-09 NOTE — Patient Instructions (Signed)
Do silver nitrate to the area once daily (3 sticks each time). Pack with saline dampened gauze. Will see you next week.

## 2023-03-15 ENCOUNTER — Encounter: Payer: Self-pay | Admitting: General Surgery

## 2023-03-15 ENCOUNTER — Ambulatory Visit: Payer: Medicare HMO | Admitting: General Surgery

## 2023-03-15 VITALS — BP 103/67 | HR 85 | Temp 98.3°F | Resp 14 | Ht 63.0 in | Wt 208.0 lb

## 2023-03-15 DIAGNOSIS — L89310 Pressure ulcer of right buttock, unstageable: Secondary | ICD-10-CM

## 2023-03-15 NOTE — Progress Notes (Signed)
[facility]  Doing well but still with discharge and drainage.   BP 103/67   Pulse 85   Temp 98.3 F (36.8 C) (Oral)   Resp 14   Ht 5\' 3"  (1.6 m)   Wt 208 lb (94.3 kg)   SpO2 95%   BMI 36.85 kg/m  Packing removed, drainage with necrotic tissue at base, granulation tissue in remainder of wound   Patient s/p excisional debridement of her right buttock ulcer. Doing better but still with necrotic tissue forming in the deep aspect of the wound from pressure and likely adipose with poor perfusion.   Continue packing. Continue silver nitrate sticks on the dead tissue. Can try medihoney too on the wound.   Future Appointments  Date Time Provider Department Center  03/23/2023  2:15 PM Lucretia Roers, MD [facility] None  03/30/2023  3:40 PM Mozingo, Thereasa Solo, NP [facility] None  04/04/2023  1:15 PM Glean Salvo, NP [facility] None   Algis Greenhouse, MD [facility] [address] [place] [place], [place] [address] [phone] (office)

## 2023-03-15 NOTE — Patient Instructions (Signed)
Continue packing. Continue silver nitrate sticks on the dead tissue. Can try medihoney too on the wound.

## 2023-03-23 ENCOUNTER — Encounter: Payer: Self-pay | Admitting: General Surgery

## 2023-03-23 ENCOUNTER — Ambulatory Visit: Payer: Medicare HMO | Admitting: General Surgery

## 2023-03-23 VITALS — BP 103/69 | HR 78 | Temp 98.2°F | Resp 14 | Ht 63.0 in | Wt 206.0 lb

## 2023-03-23 DIAGNOSIS — L89310 Pressure ulcer of right buttock, unstageable: Secondary | ICD-10-CM

## 2023-03-23 NOTE — Progress Notes (Signed)
[facility]  Doing well. Packing going well.  BP 103/69   Pulse 78   Temp 98.2 F (36.8 C) (Oral)   Resp 14   Ht 5\' 3"  (1.6 m)   Wt 206 lb (93.4 kg)   SpO2 90%   BMI 36.49 kg/m  Right buttock wound, granulation with some drainage in the deeper part/ cracks Silver nitrate and peroxide applied   Patient s/p right buttock ulcer debridement, healing slowing.  Use the peroxide and silver nitrate sticks every other change in the AM. Use your medihoney in the evening.  Call with changes.   Future Appointments  Date Time Provider Department Center  03/28/2023 10:45 AM Lucretia Roers, MD [facility] None  03/30/2023  3:40 PM Mozingo, Thereasa Solo, NP [facility] None  04/04/2023  1:15 PM Glean Salvo, NP [facility] None   Algis Greenhouse, MD [facility] [address] [place] [place], [place] [address] [phone] (office)

## 2023-03-23 NOTE — Patient Instructions (Signed)
Use the peroxide and silver nitrate sticks every other change in the AM. Use your medihoney in the evening.  Call with changes.

## 2023-03-28 ENCOUNTER — Ambulatory Visit: Payer: Medicare HMO | Admitting: General Surgery

## 2023-03-28 ENCOUNTER — Encounter: Payer: Self-pay | Admitting: General Surgery

## 2023-03-28 VITALS — BP 96/69 | HR 80 | Temp 98.1°F | Resp 12 | Ht 63.0 in | Wt 206.0 lb

## 2023-03-28 DIAGNOSIS — L89310 Pressure ulcer of right buttock, unstageable: Secondary | ICD-10-CM

## 2023-03-28 NOTE — Progress Notes (Unsigned)
[facility]  Future Appointments  Date Time Provider Department Center  03/30/2023  3:40 PM Mozingo, Thereasa Solo, NP [facility] None  04/04/2023  1:15 PM Glean Salvo, NP [facility] None  04/12/2023  3:00 PM Lucretia Roers, MD [facility] None

## 2023-03-30 ENCOUNTER — Encounter: Payer: Self-pay | Admitting: Adult Health

## 2023-03-30 ENCOUNTER — Telehealth: Payer: Medicare HMO | Admitting: Adult Health

## 2023-03-30 DIAGNOSIS — G4700 Insomnia, unspecified: Secondary | ICD-10-CM

## 2023-03-30 DIAGNOSIS — F410 Panic disorder [episodic paroxysmal anxiety] without agoraphobia: Secondary | ICD-10-CM | POA: Diagnosis not present

## 2023-03-30 DIAGNOSIS — F331 Major depressive disorder, recurrent, moderate: Secondary | ICD-10-CM

## 2023-03-30 DIAGNOSIS — F411 Generalized anxiety disorder: Secondary | ICD-10-CM | POA: Diagnosis not present

## 2023-03-30 DIAGNOSIS — F339 Major depressive disorder, recurrent, unspecified: Secondary | ICD-10-CM

## 2023-03-30 DIAGNOSIS — F4310 Post-traumatic stress disorder, unspecified: Secondary | ICD-10-CM

## 2023-03-30 MED ORDER — CARIPRAZINE HCL 1.5 MG PO CAPS: 1.5000 mg | ORAL_CAPSULE | Freq: Every day | ORAL | Status: DC

## 2023-03-30 MED ORDER — LAMOTRIGINE 100 MG PO TABS
ORAL_TABLET | ORAL | 2 refills | Status: DC
Start: 2023-03-30 — End: 2023-05-01
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
## 2023-04-03 NOTE — Progress Notes (Unsigned)
Patient: [name] Date of Birth: March 24, 1956  Reason for Visit: Follow up History from: Patient Primary Neurologist: Lucia Gaskins   ASSESSMENT AND PLAN 67 y.o. year old female   1.  Chronic migraine headaches 2.  Chronic pain 3.  Gait instability  -Printed patient assistance application for Ajovy she will complete in office portion, we will send our portion today, I gave her # 2 samples of Ajovy ([id number] 5/26) -If she does not qualify for patient assistance, may have to try Botox, I am hesitant to give up on Ajovy as it has been 95% helpful!  I am happy to help with samples, but cannot guarantee when I may have them -Currently having migraine, gave sample of Nurtec to hopefully break cycle, usually Maxalt and Tylenol work very well Luisa Dago [phone] 5/26) -She wants to hold off on sleep study, revisit in 6 months -Follow-up in 6 months or sooner if needed  HISTORY OF PRESENT ILLNESS: Today 04/03/23   Update 09/17/22 SS: When she saw Dr. Lucia Gaskins in November given Ajovy in office and 3 samples. It tremendously helped her migraines 95%. She called her insurance, they covered but her portion was going to be $300. With the Ajovy felt her balance improved, it continues to be better, having knee replacement in June on the right, likely contributes to imbalance.  Right now without Ajovy daily headache.  Had MRI cervical spine December 2023 showing mild degenerative changes C3-C4 through C6-C7.  No spinal stenosis or nerve root compression.  Referred for sleep study. Claims she never heard anything. Morning fatigue is not quite as bad. Husband says she snores.   Migraines are across forehead, in her eyes, sensitive to light, sound, nauseated, no vomiting. Takes Zofran. Takes Tylenol and Maxalt, it helps.   Goes to pain specialist for back pain.   HISTORY  05/12/22 Dr. Lucia Gaskins HPI:  [name] is a 67 y.o. female here as requested by Waldron Session, FNP for headaches,imbalance. PMHx GI  bleed/rectal bleed, constipation on chronic opiods, hypokalemia and hypocalcemia, colitis, hypertension, hyperlipidemia, diabetes type 2, iron deficiency anemia, depression, osteoarthritis of hips.  I reviewed Dr. Bernette Redbird notes, she presented to discuss possible referral to neurology for impaired balance and migraines/headaches, she reported her ability to ambulate is impaired because of her imbalance and her pain, she reports when she bends over and stands upright she is unable to take any steps due to feeling off balance, she reports that her pain is center provider recommended that she see neurology again since her initial fall the neck and back pain is continued to worsen as well, she is followed by Ortho for continued treatment of neck and back pain and followed by psych.   She fell backwards out of a chair in 03/2018 and since having chronic pain, headaches, worsening migraines. She has chronic neck pain, srays completely tense, ongoing for years, failed conservative treatment and has been under the care of pcp and ortho. Her migraines are on the right side, can spread across the forehead, pulsating/pounding/throbbing, photophobia, nausea, no vomiting. She has daily headaches, she "used" to have sleep apnea and lost 100 pounds and stopped using the cpap, she does have some morning headaches, and nocturnal headaches, she wake up tired, takes her to the afternoon to feel better, Jer imbalance is when she stands she feels like she has to stabilize and if she walks she gets dizzy, imbalance, falls at least once a month. She went ti PT for 3 months in  2019.    Reviewed notes, labs and imaging from outside physicians, which showed:   From a thorough review of records, medications tried that can be used in migraine/headache management include: Tylenol, aspirin, BuSpar, Decadron, Prozac, gabapentin, meclizine, Robaxin, Reglan, Zofran, oxycodone, prednisone, Compazine, Phenergan, Maxalt, Imitrex, tizanidine,  trazodone, amitriptyline and nortriptyline and other tricyclic antidepressants contraindicated due to patient being on multiple serotonin drugs and risk of serotonin syndrome, trazodone, topiramate(did not help), aimovig contraindicated due to constipation, she has also tried amitriptyline, propranolol, topamax.   REVIEW OF SYSTEMS: Out of a complete 14 system review of symptoms, the patient complains only of the following symptoms, and all other reviewed systems are negative.  See HPI  ALLERGIES: Allergies  Allergen Reactions   Hydromorphone Other (See Comments) and Shortness Of Breath    Respiratory arrest per patient  Other reaction(s): coded with fast admin  Note: Imported from external source.   Penicillins Anaphylaxis and Other (See Comments)   Erythromycin Base Nausea And Vomiting   Ibuprofen Other (See Comments)    Per pt, it makes her bleed   Liraglutide    Meperidine Hives   Meperidine Hcl Other (See Comments)   Nsaids Other (See Comments)    GI bleed     HOME MEDICATIONS: Outpatient Medications Prior to Visit  Medication Sig Dispense Refill   albuterol (VENTOLIN HFA) 108 (90 Base) MCG/ACT inhaler Inhale into the lungs.     Ascorbic Acid (VITAMIN C) 1000 MG tablet Take 1,000 mg by mouth daily.     buPROPion (WELLBUTRIN SR) 150 MG 12 hr tablet Take 1 tablet (150 mg total) by mouth 2 (two) times daily. 180 tablet 3   busPIRone (BUSPAR) 10 MG tablet Take 1.5 tablets (15 mg total) by mouth 2 (two) times daily. 60 tablet 5   cariprazine (VRAYLAR) 1.5 MG capsule Take 1 capsule (1.5 mg total) by mouth daily.     Cholecalciferol (VITAMIN D) 125 MCG (5000 UT) CAPS Take 2 capsules by mouth daily.     COLLAGEN PO Take 6,000 mg by mouth daily.     collagenase (SANTYL) 250 UNIT/GM ointment Apply 1 Application topically daily. Apply to deep part of wound daily pack with saline dampened gauze after 30 g 0   cyanocobalamin 2000 MCG tablet Take 2,000 mcg by mouth daily.     estradiol  (ESTRACE) 0.5 MG tablet Take 0.5 mg by mouth daily.     ferrous gluconate (FERGON) 324 MG tablet Take 324 mg by mouth daily with breakfast.     fluconazole (DIFLUCAN) 150 MG tablet Take 150 mg by mouth once a week.     FLUoxetine (PROZAC) 40 MG capsule Take 1 capsule (40 mg total) by mouth daily. 90 capsule 3   furosemide (LASIX) 20 MG tablet Take 20 mg by mouth every Monday, Wednesday, and Friday.     gabapentin (NEURONTIN) 600 MG tablet Take 600 mg by mouth in the morning, at noon, in the evening, and at bedtime.     insulin aspart (NOVOLOG) 100 UNIT/ML injection Inject 5-8 Units into the skin 3 (three) times daily before meals.     lamoTRIgine (LAMICTAL) 100 MG tablet Take one tablet at bedtime. 30 tablet 2   LANTUS SOLOSTAR 100 UNIT/ML Solostar Pen Inject 44 Units into the skin daily. At hs     levocetirizine (XYZAL) 5 MG tablet Take 5 mg by mouth every evening.     levothyroxine (SYNTHROID) 175 MCG tablet Take 175 mcg by mouth daily before breakfast.  LORazepam (ATIVAN) 0.5 MG tablet Take one to 2 tablets daily as needed for panic attacks. 60 tablet 2   lovastatin (MEVACOR) 40 MG tablet Take 40 mg by mouth at bedtime.     meclizine (ANTIVERT) 25 MG tablet Take 25 mg by mouth 3 (three) times daily as needed for dizziness.     methocarbamol (ROBAXIN) 500 MG tablet Take 1 tablet (500 mg total) by mouth every 6 (six) hours as needed for muscle spasms. 40 tablet 0   montelukast (SINGULAIR) 10 MG tablet Take 10 mg by mouth daily.     ondansetron (ZOFRAN) 4 MG tablet Take 4 mg by mouth 3 (three) times daily as needed for refractory nausea / vomiting.     oxybutynin (DITROPAN) 5 MG tablet Take 5 mg by mouth daily.     oxyCODONE (ROXICODONE) 15 MG immediate release tablet Take 15 mg by mouth.     oxyCODONE (ROXICODONE) 5 MG immediate release tablet Take 1 tablet (5 mg total) by mouth every 4 (four) hours as needed for severe pain or breakthrough pain. 10 tablet 0   OZEMPIC, 0.25 OR 0.5 MG/DOSE, 2  MG/3ML SOPN Inject 0.5 mg into the skin once a week. Fridays     pantoprazole (PROTONIX) 40 MG tablet Take 40 mg by mouth daily.     rizatriptan (MAXALT) 10 MG tablet Take 10 mg by mouth every 6 (six) hours as needed for migraine. May repeat in 2 hours if needed     SYMBICORT 80-4.5 MCG/ACT inhaler Inhale into the lungs.     zinc gluconate 50 MG tablet Take 50 mg by mouth daily.     Facility-Administered Medications Prior to Visit  Medication Dose Route Frequency Provider Last Rate Last Admin   Fremanezumab-vfrm SOSY 225 mg  225 mg Subcutaneous Once Anson Fret, MD        PAST MEDICAL HISTORY: Past Medical History:  Diagnosis Date   Anxiety    Asthma    Chronic pain    Depression    Diabetes mellitus without complication (HCC)    Fibromyalgia    GERD (gastroesophageal reflux disease)    Headache    migraines   Hyperlipidemia    Hypothyroidism    Osteoarthritis    PONV (postoperative nausea and vomiting)     PAST SURGICAL HISTORY: Past Surgical History:  Procedure Laterality Date   ABDOMINAL HYSTERECTOMY     ANKLE RECONSTRUCTION Left    1977   APPENDECTOMY     CARPAL TUNNEL RELEASE Right    2010   CATARACT EXTRACTION Left    02/2021   catheter ablation     1993   CESAREAN SECTION     x2   CHOLECYSTECTOMY     HERNIA REPAIR     INCISION AND DRAINAGE OF WOUND Right 02/20/2023   Procedure: IRRIGATION AND DEBRIDEMENT BUTTOCKS;  Surgeon: Lucretia Roers, MD;  Location: [facility];  Service: General;  Laterality: Right;   KNEE ARTHROSCOPY W/ MENISCECTOMY Right    2008   KNEE DISLOCATION SURGERY Right    1977   left carpal tunnel release      panectomy     2019   TOTAL HIP ARTHROPLASTY Right 04/12/2021   Procedure: TOTAL HIP ARTHROPLASTY ANTERIOR APPROACH;  Surgeon: Ollen Gross, MD;  Location: [facility];  Service: Orthopedics;  Laterality: Right;   TOTAL KNEE ARTHROPLASTY Right 11/28/2022   Procedure: TOTAL KNEE ARTHROPLASTY;  Surgeon: Ollen Gross, MD;  Location:  [facility];  Service: Orthopedics;  Laterality: Right;   vaginal fistula of sigmoid colon      FAMILY HISTORY: No family history on file.  SOCIAL HISTORY: Social History   Socioeconomic History   Marital status: Divorced    Spouse name: Not on file   Number of children: Not on file   Years of education: Not on file   Highest education level: Not on file  Occupational History   Not on file  Tobacco Use   Smoking status: Former    Types: Cigarettes    Passive exposure: Never   Smokeless tobacco: Never  Vaping Use   Vaping status: Never Used  Substance and Sexual Activity   Alcohol use: Never   Drug use: Never   Sexual activity: Not Currently    Birth control/protection: None  Other Topics Concern   Not on file  Social History Narrative   Caffiene 1-2 cups daily.   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    Social Determinants of Health   Financial Resource Strain: Not on file  Food Insecurity: No Food Insecurity (11/28/2022)   Hunger Vital Sign    Worried About Running Out of Food in the Last Year: Never true    Ran Out of Food in the Last Year: Never true  Transportation Needs: No Transportation Needs (11/28/2022)   PRAPARE - Administrator, Civil Service (Medical): No    Lack of Transportation (Non-Medical): No  Physical Activity: Not on file  Stress: Not on file  Social Connections: Not on file  Intimate Partner Violence: Not At Risk (11/28/2022)   Humiliation, Afraid, Rape, and Kick questionnaire    Fear of Current or Ex-Partner: No    Emotionally Abused: No    Physically Abused: No    Sexually Abused: No   PHYSICAL EXAM  There were no vitals filed for this visit.  There is no height or weight on file to calculate BMI.  Generalized: Well developed, in no acute distress  Neurological examination  Mentation: Alert oriented to time, place, history taking. Follows all commands speech and language fluent Cranial nerve  II-XII: Pupils were equal round reactive to light. Extraocular movements were full, visual field were full on confrontational test. Facial sensation and strength were normal. Head turning and shoulder shrug  were normal and symmetric. Motor: The motor testing reveals 5 over 5 strength of all 4 extremities. Good symmetric motor tone is noted throughout.  Sensory: Sensory testing is intact to soft touch on all 4 extremities. No evidence of extinction is noted.  Coordination: Cerebellar testing reveals good finger-nose-finger bilaterally, cannot do heel-to-shin with the right due to knee pain Gait and station: Gait is antalgic limp on the right Reflexes: Deep tendon reflexes are symmetric and normal bilaterally.   DIAGNOSTIC DATA (LABS, IMAGING, TESTING) - I reviewed patient records, labs, notes, testing and imaging myself where available.  Lab Results  Component Value Date   WBC 8.5 02/17/2023   HGB 12.9 02/17/2023   HCT 43.0 02/17/2023   MCV 92.5 02/17/2023   PLT 245 02/17/2023      Component Value Date/Time   NA 140 02/17/2023 1005   K 4.3 02/17/2023 1005   CL 100 02/17/2023 1005   CO2 30 02/17/2023 1005   GLUCOSE 149 (H) 02/17/2023 1005   BUN 12 02/17/2023 1005   CREATININE 0.88 02/17/2023 1005   CALCIUM 9.2 02/17/2023 1005   PROT 5.8 (L) 12/23/2021 0527   ALBUMIN  2.8 (L) 12/23/2021 0527   AST 16 12/23/2021 0527   ALT 15 12/23/2021 0527   ALKPHOS 54 12/23/2021 0527   BILITOT 0.6 12/23/2021 0527   GFRNONAA >60 02/17/2023 1005   No results found for: "CHOL", "HDL", "LDLCALC", "LDLDIRECT", "TRIG", "CHOLHDL" Lab Results  Component Value Date   HGBA1C 6.3 (H) 02/17/2023   No results found for: "VITAMINB12" No results found for: "TSH"  Margie Ege, AGNP-C, DNP 04/03/2023, 9:25 PM [facility] [address] [place], [place] [address] [phone]

## 2023-04-04 ENCOUNTER — Encounter: Payer: Self-pay | Admitting: Neurology

## 2023-04-04 ENCOUNTER — Ambulatory Visit: Payer: Medicare HMO | Admitting: Neurology

## 2023-04-04 VITALS — BP 100/64 | HR 66 | Ht 63.0 in | Wt 202.0 lb

## 2023-04-04 DIAGNOSIS — F32A Depression, unspecified: Secondary | ICD-10-CM

## 2023-04-04 DIAGNOSIS — G43709 Chronic migraine without aura, not intractable, without status migrainosus: Secondary | ICD-10-CM | POA: Insufficient documentation

## 2023-04-04 DIAGNOSIS — G8929 Other chronic pain: Secondary | ICD-10-CM | POA: Diagnosis not present

## 2023-04-04 DIAGNOSIS — G4730 Sleep apnea, unspecified: Secondary | ICD-10-CM

## 2023-04-04 DIAGNOSIS — M797 Fibromyalgia: Secondary | ICD-10-CM | POA: Insufficient documentation

## 2023-04-04 MED ORDER — RIZATRIPTAN BENZOATE 10 MG PO TABS: 10.0000 mg | ORAL_TABLET | Freq: Four times a day (QID) | ORAL | 11 refills | Status: AC | PRN

## 2023-04-04 MED ORDER — EMGALITY 120 MG/ML ~~LOC~~ SOAJ: 120.0000 mg | SUBCUTANEOUS | 0 refills | Status: DC

## 2023-04-04 MED ORDER — EMGALITY 120 MG/ML ~~LOC~~ SOAJ: 120.0000 mg | SUBCUTANEOUS | 11 refills | Status: DC

## 2023-04-04 NOTE — Patient Instructions (Signed)
We will try to get Emgality approved, start taking loading dose 2 injections the 1st month followed by 1 injection monthly thereafter for migraine prevention. Continue maxalt as needed. Referral for sleep consult.

## 2023-04-12 ENCOUNTER — Ambulatory Visit: Payer: Medicare HMO | Admitting: General Surgery

## 2023-04-12 ENCOUNTER — Encounter: Payer: Self-pay | Admitting: General Surgery

## 2023-04-12 VITALS — BP 119/78 | HR 73 | Temp 98.0°F | Resp 14 | Ht 63.0 in | Wt 204.0 lb

## 2023-04-12 DIAGNOSIS — L89310 Pressure ulcer of right buttock, unstageable: Secondary | ICD-10-CM | POA: Diagnosis not present

## 2023-04-12 NOTE — Progress Notes (Signed)
[facility]  Doing well. Less draingae.  BP 119/78   Pulse 73   Temp 98 F (36.7 C) (Oral)   Resp 14   Ht 5\' 3"  (1.6 m)   Wt 204 lb (92.5 kg)   SpO2 94%   BMI 36.14 kg/m  Granulation at base and in tunnel, minimal drainage  Patient s/p debridement of right buttock ulcer. Doing well.  Continue medihoney and wound packing   Future Appointments  Date Time Provider Department Center  04/26/2023  2:30 PM Lucretia Roers, MD RS-RS None  05/01/2023  1:40 PM Mozingo, Thereasa Solo, NP CP-CP None  10/25/2023  2:15 PM Glean Salvo, NP [facility] None   Algis Greenhouse, MD [facility] [address] [place], [place] [address] [phone] (office)

## 2023-04-12 NOTE — Patient Instructions (Signed)
Continue medihoney and wound packing

## 2023-04-26 ENCOUNTER — Ambulatory Visit: Payer: Medicare HMO | Admitting: General Surgery

## 2023-05-01 ENCOUNTER — Encounter: Payer: Self-pay | Admitting: Adult Health

## 2023-05-01 ENCOUNTER — Telehealth: Payer: Medicare HMO | Admitting: Adult Health

## 2023-05-01 DIAGNOSIS — F4310 Post-traumatic stress disorder, unspecified: Secondary | ICD-10-CM | POA: Diagnosis not present

## 2023-05-01 DIAGNOSIS — G4700 Insomnia, unspecified: Secondary | ICD-10-CM

## 2023-05-01 DIAGNOSIS — F410 Panic disorder [episodic paroxysmal anxiety] without agoraphobia: Secondary | ICD-10-CM | POA: Diagnosis not present

## 2023-05-01 DIAGNOSIS — F411 Generalized anxiety disorder: Secondary | ICD-10-CM

## 2023-05-01 DIAGNOSIS — F331 Major depressive disorder, recurrent, moderate: Secondary | ICD-10-CM | POA: Diagnosis not present

## 2023-05-01 MED ORDER — LAMOTRIGINE 150 MG PO TABS
ORAL_TABLET | ORAL | 2 refills | Status: DC
Start: 2023-05-01 — End: 2023-07-05

## 2023-05-01 NOTE — Progress Notes (Signed)
[name] [id number] [birth year] 67 y.o.  Virtual Visit via Video Note  I connected with pt @ on 05/01/23 at  1:40 PM EST by a video enabled telemedicine application and verified that I am speaking with the correct person using two identifiers.   I discussed the limitations of evaluation and management by telemedicine and the availability of in person appointments. The patient expressed understanding and agreed to proceed.  I discussed the assessment and treatment plan with the patient. The patient was provided an opportunity to ask questions and all were answered. The patient agreed with the plan and demonstrated an understanding of the instructions.   The patient was advised to call back or seek an in-person evaluation if the symptoms worsen or if the condition fails to improve as anticipated.  I provided 25 minutes of non-face-to-face time during this encounter.  The patient was located at home.  The provider was located at [facility].   Dorothyann Gibbs, NP   Subjective:   Patient ID:  [name] is a 67 y.o. (DOB 1956-05-30) female.  Chief Complaint: No chief complaint on file.   [name], [name], [name], insomnia, and panic attacks.  Describes mood today as "ok". Pleasant. Tearful at times. Mood symptoms - reports decreased depression, anxiety, and irritability. Denies recent panic attacks. Reports some worry, rumination, and over thinking. Mood has improved. Stating "I feel like I'm doing better". Feels like the Vraylar is helpful for mood, but reports drooling out of the side of her mouth. She is willing to consider other options.  Taking medications as prescribed. Followed for multiple medical issues. Energy levels lower. Active, does not have a regular exercise routine with physical disabilities. Unable to enjoy usual interests and activities. Divorced from husband of 40 years, but lives with ex-husband and his mother in  law - 15 years old. Has a dog yorkie - "[name]". Has 3 grown children. Spending time with family. Appetite adequate. Weight stable - 212 pounds - 63". A1C 6.2 - Ozempic. Sleeping well most nights. Averages 6 to 8 hours. Reports focus and concentration difficulties. Completing tasks. Managing aspects of household. 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  Denies AH or VH. Denies self harm. Denies substance abuse. Reporting some "paranoia" when in crowded spaces.   Chronic pain - working with pain management.  Reports tremors in her thumbs. Seeing Neurologist next month.  Seeing Dr. Lucia Gaskins - managing migraines  Previous medication trials: Cymbalta, Prozac   Review of Systems:  Review of Systems  Musculoskeletal:  Negative for gait problem.  Neurological:  Negative for tremors.  Psychiatric/Behavioral:         Please refer to HPI    Medications: I have reviewed the patient's current medications.  Current Outpatient Medications  Medication Sig Dispense Refill   albuterol (VENTOLIN HFA) 108 (90 Base) MCG/ACT inhaler Inhale into the lungs.     Ascorbic Acid (VITAMIN C) 1000 MG tablet Take 1,000 mg by mouth daily.     buPROPion (WELLBUTRIN SR) 150 MG 12 hr tablet Take 1 tablet (150 mg total) by mouth 2 (two) times daily. 180 tablet 3   busPIRone (BUSPAR) 10 MG tablet Take 1.5 tablets (15 mg total) by mouth 2 (two) times daily. 60 tablet 5   Cholecalciferol (VITAMIN D) 125 MCG (5000 UT) CAPS Take 2 capsules by mouth daily.     COLLAGEN PO Take 6,000 mg by  mouth daily.     collagenase (SANTYL) 250 UNIT/GM ointment Apply 1 Application topically daily. Apply to deep part of wound daily pack with saline dampened gauze after 30 g 0   cyanocobalamin 2000 MCG tablet Take 2,000 mcg by mouth daily.     estradiol (ESTRACE) 0.5 MG tablet Take 0.5 mg by mouth daily.     ferrous gluconate (FERGON) 324 MG tablet Take 324 mg by mouth daily with breakfast.     fluconazole  (DIFLUCAN) 150 MG tablet Take 150 mg by mouth once a week.     FLUoxetine (PROZAC) 40 MG capsule Take 1 capsule (40 mg total) by mouth daily. 90 capsule 3   furosemide (LASIX) 20 MG tablet Take 20 mg by mouth every Monday, Wednesday, and Friday.     gabapentin (NEURONTIN) 600 MG tablet Take 600 mg by mouth in the morning, at noon, in the evening, and at bedtime.     Galcanezumab-gnlm (EMGALITY) 120 MG/ML SOAJ Inject 120 mg into the skin every 30 (thirty) days. 2 mL 0   Galcanezumab-gnlm (EMGALITY) 120 MG/ML SOAJ Inject 120 mg into the skin every 30 (thirty) days. 1.12 mL 11   insulin aspart (NOVOLOG) 100 UNIT/ML injection Inject 5-8 Units into the skin 3 (three) times daily before meals.     lamoTRIgine (LAMICTAL) 150 MG tablet Take one tablet at bedtime. 30 tablet 2   LANTUS SOLOSTAR 100 UNIT/ML Solostar Pen Inject 44 Units into the skin daily. At hs     levocetirizine (XYZAL) 5 MG tablet Take 5 mg by mouth every evening.     levothyroxine (SYNTHROID) 200 MCG tablet Take 175 mcg by mouth daily before breakfast.     LORazepam (ATIVAN) 0.5 MG tablet Take one to 2 tablets daily as needed for panic attacks. 60 tablet 2   LOVASTATIN PO Take 80 mg by mouth at bedtime.     meclizine (ANTIVERT) 25 MG tablet Take 25 mg by mouth 3 (three) times daily as needed for dizziness.     montelukast (SINGULAIR) 10 MG tablet Take 10 mg by mouth daily.     ondansetron (ZOFRAN) 4 MG tablet Take 4 mg by mouth 3 (three) times daily as needed for refractory nausea / vomiting.     oxybutynin (DITROPAN) 5 MG tablet Take 5 mg by mouth every 8 (eight) hours as needed.     Oxycodone HCl 20 MG TABS Take 15 mg by mouth.     OZEMPIC, 0.25 OR 0.5 MG/DOSE, 2 MG/3ML SOPN Inject 0.5 mg into the skin once a week. Fridays     pantoprazole (PROTONIX) 40 MG tablet Take 40 mg by mouth daily.     rizatriptan (MAXALT) 10 MG tablet Take 1 tablet (10 mg total) by mouth every 6 (six) hours as needed for migraine. May repeat in 2 hours if  needed 10 tablet 11   SYMBICORT 80-4.5 MCG/ACT inhaler Inhale into the lungs.     zinc gluconate 50 MG tablet Take 50 mg by mouth daily.     No current facility-administered medications for this visit.    Medication Side Effects: None  Allergies:  Allergies  Allergen Reactions   Hydromorphone Other (See Comments) and Shortness Of Breath    Respiratory arrest per patient  Other reaction(s): coded with fast admin  Note: Imported from external source.   Penicillins Anaphylaxis and Other (See Comments)   Erythromycin Base Nausea And Vomiting   Ibuprofen Other (See Comments)    Per pt, it makes her bleed  Liraglutide    Meperidine Hives   Meperidine Hcl Other (See Comments)   Nsaids Other (See Comments)    GI bleed     Past Medical History:  Diagnosis Date   Anxiety    Asthma    Chronic pain    Depression    Diabetes mellitus without complication (HCC)    Fibromyalgia    GERD (gastroesophageal reflux disease)    Headache    migraines   Hyperlipidemia    Hypothyroidism    Osteoarthritis    PONV (postoperative nausea and vomiting)     No family history on file.  Social History   Socioeconomic History   Marital status: Divorced    Spouse name: Not on file   Number of children: Not on file   Years of education: Not on file   Highest education level: Not on file  Occupational History   Not on file  Tobacco Use   Smoking status: Former    Types: Cigarettes    Passive exposure: Never   Smokeless tobacco: Never  Vaping Use   Vaping status: Never Used  Substance and Sexual Activity   Alcohol use: Never   Drug use: Never   Sexual activity: Not Currently    Birth control/protection: None  Other Topics Concern   Not on file  Social History Narrative   Caffiene 1-2 cups daily.   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    Social Determinants of Health   Financial Resource Strain: Not on file  Food Insecurity: No Food  Insecurity (11/28/2022)   Hunger Vital Sign    Worried About Running Out of Food in the Last Year: Never true    Ran Out of Food in the Last Year: Never true  Transportation Needs: No Transportation Needs (11/28/2022)   PRAPARE - Administrator, Civil Service (Medical): No    Lack of Transportation (Non-Medical): No  Physical Activity: Not on file  Stress: Not on file  Social Connections: Not on file  Intimate Partner Violence: Not At Risk (11/28/2022)   Humiliation, Afraid, Rape, and Kick questionnaire    Fear of Current or Ex-Partner: No    Emotionally Abused: No    Physically Abused: No    Sexually Abused: No    Past Medical History, Surgical history, Social history, and Family history were reviewed and updated as appropriate.   Please see review of systems for further details on the patient's review from today.   Objective:   Physical Exam:  There were no vitals taken for this visit.  Physical Exam Constitutional:      General: She is not in acute distress. Musculoskeletal:        General: No deformity.  Neurological:     Mental Status: She is alert and oriented to person, place, and time.     Coordination: Coordination normal.  Psychiatric:        Attention and Perception: Attention and perception normal. She does not perceive auditory or visual hallucinations.        Mood and Affect: Affect is not labile, blunt, angry or inappropriate.        Speech: Speech normal.        Behavior: Behavior normal.        Thought Content: Thought content normal. Thought content is not paranoid or delusional. Thought content does not include homicidal or suicidal ideation. Thought content does not include homicidal or suicidal plan.  Cognition and Memory: Cognition and memory normal.        Judgment: Judgment normal.     Comments: Insight intact     Lab Review:     Component Value Date/Time   NA 140 02/17/2023 1005   K 4.3 02/17/2023 1005   CL 100 02/17/2023  1005   CO2 30 02/17/2023 1005   GLUCOSE 149 (H) 02/17/2023 1005   BUN 12 02/17/2023 1005   CREATININE 0.88 02/17/2023 1005   CALCIUM 9.2 02/17/2023 1005   PROT 5.8 (L) 12/23/2021 0527   ALBUMIN 2.8 (L) 12/23/2021 0527   AST 16 12/23/2021 0527   ALT 15 12/23/2021 0527   ALKPHOS 54 12/23/2021 0527   BILITOT 0.6 12/23/2021 0527   GFRNONAA >60 02/17/2023 1005       Component Value Date/Time   WBC 8.5 02/17/2023 1005   RBC 4.65 02/17/2023 1005   HGB 12.9 02/17/2023 1005   HCT 43.0 02/17/2023 1005   PLT 245 02/17/2023 1005   MCV 92.5 02/17/2023 1005   MCH 27.7 02/17/2023 1005   MCHC 30.0 02/17/2023 1005   RDW 12.8 02/17/2023 1005   LYMPHSABS 1.5 02/17/2023 1005   MONOABS 0.5 02/17/2023 1005   EOSABS 0.1 02/17/2023 1005   BASOSABS 0.1 02/17/2023 1005    No results found for: "POCLITH", "LITHIUM"   No results found for: "PHENYTOIN", "PHENOBARB", "VALPROATE", "CBMZ"   .res Assessment: Plan:    Plan:  PDMP reviewed  Discontinue Vraylar 1.5mg  daily - Patient reporting tremor and saliva. Will plan to d/c.  Increase Lamictal 100mg  to 150mg  daily   Buspar 15mg  BID - PCP Wellbutrin SR 150mg  BID - PCP  Prozac 40mg  daily  Trazadone 100mg  at bedtime Ativan 0.5mg  BID daily prn as needed - hasn't taken in a month  Time spent with patient was 25 minutes. Greater than 50% of face to face time with patient was spent on counseling and coordination of care.    RTC 4 weeks  Patient advised to contact office with any questions, adverse effects, or acute worsening in signs and symptoms.   Discussed potential benefits, risk, and side effects of benzodiazepines to include potential risk of tolerance and dependence, as well as possible drowsiness. Advised patient not to drive if experiencing drowsiness and to take lowest possible effective dose to minimize risk of dependence and tolerance.   Diagnoses and all orders for this visit:  Major depressive disorder, recurrent episode,  moderate (HCC) -     lamoTRIgine (LAMICTAL) 150 MG tablet; Take one tablet at bedtime.  Generalized anxiety disorder  Panic attacks  [name] (post-traumatic stress disorder)  Insomnia, unspecified type     Please see After Visit Summary for patient specific instructions.  Future Appointments  Date Time Provider Department Center  05/02/2023  9:45 AM Lucretia Roers, MD [facility] None  05/18/2023 12:45 PM Huston Foley, MD [facility] None  10/25/2023  2:15 PM Glean Salvo, NP [facility] None    No orders of the defined types were placed in this encounter.     -------------------------------

## 2023-05-02 ENCOUNTER — Encounter: Payer: Self-pay | Admitting: General Surgery

## 2023-05-02 ENCOUNTER — Ambulatory Visit: Payer: Medicare HMO | Admitting: General Surgery

## 2023-05-02 VITALS — BP 97/64 | HR 79 | Temp 98.2°F | Resp 14 | Ht 63.0 in | Wt 204.0 lb

## 2023-05-02 DIAGNOSIS — L89310 Pressure ulcer of right buttock, unstageable: Secondary | ICD-10-CM | POA: Diagnosis not present

## 2023-05-02 NOTE — Patient Instructions (Signed)
Contine to pack wound with gauze, medihoney at night. If wound gets too small use the plain packing strips with a little bit of saline.

## 2023-05-02 NOTE — Progress Notes (Unsigned)
[facility]  Having pain through the left buttock. Overall area closing in and drainage less. PCP thought maybe she had nerve pain.   BP 97/64   Pulse 79   Temp 98.2 F (36.8 C) (Oral)   Resp 14   Ht 5\' 3"  (1.6 m)   Wt 204 lb (92.5 kg)   SpO2 93%   BMI 36.14 kg/m  Right buttock wound 3cm deep, <2cm wide, granulated, some minor drainage, no obvious issue with left buttock or sacral region   Patient s/p debridement right buttock wound. I think some of her pain is likely from the healing and scarring in and pulling. Recommended some massage in the area on the left buttock and cover sacrum and around right buttock wound.  Potentially she does have some superficial nerve damage from the wound but I reiterated this is all superficial and will improve with time.   Contine to pack wound with gauze, medihoney at night. If wound gets too small use the plain packing strips with a little bit of saline.   Future Appointments  Date Time Provider Department Center  05/18/2023 12:45 PM Huston Foley, MD [facility] None  05/23/2023 10:45 AM Lucretia Roers, MD [facility] None  10/25/2023  2:15 PM Glean Salvo, NP [facility] None    Algis Greenhouse, MD [facility] [address] [place], [place] [address] [phone] (office)

## 2023-05-15 ENCOUNTER — Telehealth: Payer: Self-pay | Admitting: Family Medicine

## 2023-05-15 MED ORDER — DOXYCYCLINE HYCLATE 100 MG PO TABS: 100.0000 mg | ORAL_TABLET | Freq: Two times a day (BID) | ORAL | 0 refills | Status: AC

## 2023-05-15 NOTE — Telephone Encounter (Signed)
Patient called states that she has another hole in her wound like before and it is draining yellow pus. States that here spine hurts and when she presses on it the drainage comes out of the hole. Questioning if she should be put back on antibx.  Per Dr. Henreitta Leber will order antibx and wants to see in office on Wednesday. Also recommend wound care center for help with healing up wound.  Patient aware and would like to talk to Dr. Henreitta Leber before we refer her to Wound care. Appt made and antibx sent to pharm.

## 2023-05-17 ENCOUNTER — Ambulatory Visit: Payer: Medicare HMO | Admitting: General Surgery

## 2023-05-17 ENCOUNTER — Encounter: Payer: Self-pay | Admitting: General Surgery

## 2023-05-17 VITALS — BP 101/67 | HR 79 | Temp 98.2°F | Resp 14 | Ht 63.0 in | Wt 201.0 lb

## 2023-05-17 DIAGNOSIS — L89310 Pressure ulcer of right buttock, unstageable: Secondary | ICD-10-CM | POA: Diagnosis not present

## 2023-05-17 NOTE — Patient Instructions (Signed)
Pack area with iodoform 1/4 inch. Can do this twice daily.  Take the antibiotic. Will call if culture grows something it is not sensitive too.

## 2023-05-18 ENCOUNTER — Institutional Professional Consult (permissible substitution): Payer: Medicare HMO | Admitting: Neurology

## 2023-05-18 ENCOUNTER — Telehealth: Payer: Self-pay | Admitting: Neurology

## 2023-05-18 NOTE — Progress Notes (Signed)
[facility]  Having drainage from the wound. Called the office and placed on doxycycline. Packing has been going ok.  BP 101/67   Pulse 79   Temp 98.2 F (36.8 C) (Oral)   Resp 14   Ht 5\' 3"  (1.6 m)   Wt 201 lb (91.2 kg)   SpO2 93%   BMI 35.61 kg/m  Granulation of the right buttock wound with a tunneling track probed by the qtip to the sacral region, about 5cm long tracking to the sacrum and likely about the width of <1cm packed with steristrips   Patient with slow healing right buttock ulcer. Discussed option of sending to wound care since this is subacute going into chronic. She does not want to do the wound care center now.   Pack area with iodoform 1/4 inch. Can do this twice daily.  Take the antibiotic. Will call if culture grows something it is not sensitive too.  Continue to antibiotics.   Algis Greenhouse, MD [facility] [address] [place], [place] [address] [phone] (office)

## 2023-05-18 NOTE — Telephone Encounter (Signed)
Pt cancelling appt due to having fever, cough, head congestion

## 2023-05-20 LAB — WOUND CULTURE
MICRO NUMBER:: 15810264
RESULT:: NORMAL
SPECIMEN QUALITY:: ADEQUATE

## 2023-05-23 ENCOUNTER — Ambulatory Visit: Payer: Medicare HMO | Admitting: General Surgery

## 2023-05-26 ENCOUNTER — Other Ambulatory Visit: Payer: Self-pay | Admitting: Adult Health

## 2023-05-31 ENCOUNTER — Telehealth: Payer: Medicare HMO | Admitting: Adult Health

## 2023-05-31 ENCOUNTER — Encounter: Payer: Self-pay | Admitting: Adult Health

## 2023-05-31 DIAGNOSIS — F4310 Post-traumatic stress disorder, unspecified: Secondary | ICD-10-CM | POA: Diagnosis not present

## 2023-05-31 DIAGNOSIS — G4700 Insomnia, unspecified: Secondary | ICD-10-CM

## 2023-05-31 DIAGNOSIS — F331 Major depressive disorder, recurrent, moderate: Secondary | ICD-10-CM | POA: Diagnosis not present

## 2023-05-31 DIAGNOSIS — F411 Generalized anxiety disorder: Secondary | ICD-10-CM

## 2023-05-31 DIAGNOSIS — F410 Panic disorder [episodic paroxysmal anxiety] without agoraphobia: Secondary | ICD-10-CM

## 2023-05-31 MED ORDER — FLUOXETINE HCL 20 MG PO CAPS
20.0000 mg | ORAL_CAPSULE | Freq: Every day | ORAL | 3 refills | Status: DC
Start: 2023-05-31 — End: 2023-07-05

## 2023-05-31 NOTE — Progress Notes (Signed)
[name] [id number] 27-Jul-1955 67 y.o.  Virtual Visit via Video Note  I connected with pt @ on 05/31/23 at  2:00 PM EST by a video enabled telemedicine application and verified that I am speaking with the correct person using two identifiers.   I discussed the limitations of evaluation and management by telemedicine and the availability of in person appointments. The patient expressed understanding and agreed to proceed.  I discussed the assessment and treatment plan with the patient. The patient was provided an opportunity to ask questions and all were answered. The patient agreed with the plan and demonstrated an understanding of the instructions.   The patient was advised to call back or seek an in-person evaluation if the symptoms worsen or if the condition fails to improve as anticipated.  I provided 25 minutes of non-face-to-face time during this encounter.  The patient was located at home.  The provider was located at [facility].   Melanie Gibbs, NP   Subjective:   Patient ID:  [name] is a 67 y.o. (DOB 1956-01-30) female.  Chief Complaint: No chief complaint on file.   [name], [name], [name], insomnia, and panic attacks.  Describes mood today as "not the best". Pleasant. Tearful at times. Mood symptoms - reports more depression than anything. Reports decreased anxiety and irritability - not as bad as it has been. Denies recent panic attacks. Reports some worry, rumination, and over thinking. Mood has improved. Stating "I don't feel like I'm doing as well as I was". She feels like the Vraylar was helpful, but had to stop due to side effects. Reports decreased drooling and tremors - but still there. She is willing to consider other options. Taking medications as prescribed. Followed for multiple medical issues. Energy levels lower. Active, does not have a regular exercise routine with physical disabilities. Unable  to enjoy usual interests and activities. Divorced from husband of 40 years, but lives with ex-husband and his mother in law - 65 years old. Has a dog yorkie - "[name]". Has 3 grown children. Spending time with family. Appetite adequate. Weight loss - 201 pounds - 63". A1C 6.2 - Ozempic. Sleeping well most nights. Averages 6 to 8 hours. Reports focus and concentration difficulties. Completing tasks. Managing aspects of household. 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  Denies AH or VH. Denies self harm. Denies substance abuse. Reporting some "paranoia" when in crowded spaces.   Chronic pain - working with pain management.  Seeing Dr. Lucia Gaskins - managing migraines  Previous medication trials: Cymbalta, Prozac    Review of Systems:  Review of Systems  Musculoskeletal:  Negative for gait problem.  Neurological:  Negative for tremors.  Psychiatric/Behavioral:         Please refer to HPI    Medications: I have reviewed the patient's current medications.  Current Outpatient Medications  Medication Sig Dispense Refill   albuterol (VENTOLIN HFA) 108 (90 Base) MCG/ACT inhaler Inhale into the lungs.     Ascorbic Acid (VITAMIN C) 1000 MG tablet Take 1,000 mg by mouth daily.     buPROPion (WELLBUTRIN SR) 150 MG 12 hr tablet Take 1 tablet (150 mg total) by mouth 2 (two) times daily. 180 tablet 3   busPIRone (BUSPAR) 10 MG tablet Take 1.5 tablets (15 mg total) by mouth 2 (two) times daily. 60 tablet 5   Cholecalciferol (VITAMIN D) 125 MCG (5000 UT) CAPS Take  2 capsules by mouth daily.     COLLAGEN PO Take 6,000 mg by mouth daily.     collagenase (SANTYL) 250 UNIT/GM ointment Apply 1 Application topically daily. Apply to deep part of wound daily pack with saline dampened gauze after 30 g 0   cyanocobalamin 2000 MCG tablet Take 2,000 mcg by mouth daily.     estradiol (ESTRACE) 0.5 MG tablet Take 0.5 mg by mouth daily.     ferrous gluconate (FERGON) 324 MG tablet Take  324 mg by mouth daily with breakfast.     fluconazole (DIFLUCAN) 150 MG tablet Take 150 mg by mouth once a week.     FLUoxetine (PROZAC) 40 MG capsule Take 1 capsule (40 mg total) by mouth daily. 90 capsule 3   furosemide (LASIX) 20 MG tablet Take 20 mg by mouth every Monday, Wednesday, and Friday.     gabapentin (NEURONTIN) 600 MG tablet Take 600 mg by mouth in the morning, at noon, in the evening, and at bedtime.     Galcanezumab-gnlm (EMGALITY) 120 MG/ML SOAJ Inject 120 mg into the skin every 30 (thirty) days. 2 mL 0   Galcanezumab-gnlm (EMGALITY) 120 MG/ML SOAJ Inject 120 mg into the skin every 30 (thirty) days. 1.12 mL 11   insulin aspart (NOVOLOG) 100 UNIT/ML injection Inject 5-8 Units into the skin 3 (three) times daily before meals.     lamoTRIgine (LAMICTAL) 150 MG tablet Take one tablet at bedtime. 30 tablet 2   LANTUS SOLOSTAR 100 UNIT/ML Solostar Pen Inject 44 Units into the skin daily. At hs     levocetirizine (XYZAL) 5 MG tablet Take 5 mg by mouth every evening.     levothyroxine (SYNTHROID) 200 MCG tablet Take 175 mcg by mouth daily before breakfast.     LORazepam (ATIVAN) 0.5 MG tablet Take one to 2 tablets daily as needed for panic attacks. 60 tablet 2   LOVASTATIN PO Take 80 mg by mouth at bedtime.     meclizine (ANTIVERT) 25 MG tablet Take 25 mg by mouth 3 (three) times daily as needed for dizziness.     montelukast (SINGULAIR) 10 MG tablet Take 10 mg by mouth daily.     ondansetron (ZOFRAN) 4 MG tablet Take 4 mg by mouth 3 (three) times daily as needed for refractory nausea / vomiting.     oxybutynin (DITROPAN) 5 MG tablet Take 5 mg by mouth every 8 (eight) hours as needed.     Oxycodone HCl 20 MG TABS Take 15 mg by mouth.     OZEMPIC, 0.25 OR 0.5 MG/DOSE, 2 MG/3ML SOPN Inject 0.5 mg into the skin once a week. Fridays     pantoprazole (PROTONIX) 40 MG tablet Take 40 mg by mouth daily.     rizatriptan (MAXALT) 10 MG tablet Take 1 tablet (10 mg total) by mouth every 6 (six)  hours as needed for migraine. May repeat in 2 hours if needed 10 tablet 11   SYMBICORT 80-4.5 MCG/ACT inhaler Inhale into the lungs.     traZODone (DESYREL) 100 MG tablet TAKE 1 TABLET AT BEDTIME 90 tablet 0   zinc gluconate 50 MG tablet Take 50 mg by mouth daily.     No current facility-administered medications for this visit.    Medication Side Effects: None  Allergies:  Allergies  Allergen Reactions   Hydromorphone Other (See Comments) and Shortness Of Breath    Respiratory arrest per patient  Other reaction(s): coded with fast admin  Note: Imported from external source.  Penicillins Anaphylaxis and Other (See Comments)   Erythromycin Base Nausea And Vomiting   Ibuprofen Other (See Comments)    Per pt, it makes her bleed   Liraglutide    Meperidine Hives   Meperidine Hcl Other (See Comments)   Nsaids Other (See Comments)    GI bleed     Past Medical History:  Diagnosis Date   Anxiety    Asthma    Chronic pain    Depression    Diabetes mellitus without complication (HCC)    Fibromyalgia    GERD (gastroesophageal reflux disease)    Headache    migraines   Hyperlipidemia    Hypothyroidism    Osteoarthritis    PONV (postoperative nausea and vomiting)     No family history on file.  Social History   Socioeconomic History   Marital status: Divorced    Spouse name: Not on file   Number of children: Not on file   Years of education: Not on file   Highest education level: Not on file  Occupational History   Not on file  Tobacco Use   Smoking status: Former    Types: Cigarettes    Passive exposure: Never   Smokeless tobacco: Never  Vaping Use   Vaping status: Never Used  Substance and Sexual Activity   Alcohol use: Never   Drug use: Never   Sexual activity: Not Currently    Birth control/protection: None  Other Topics Concern   Not on file  Social History Narrative   Caffiene 1-2 cups daily.   Disabilty from accident, RN   Lives husband   3  kids,   8 grandkids.    Social Drivers of Corporate investment banker Strain: Not on file  Food Insecurity: No Food Insecurity (11/28/2022)   Hunger Vital Sign    Worried About Running Out of Food in the Last Year: Never true    Ran Out of Food in the Last Year: Never true  Transportation Needs: No Transportation Needs (11/28/2022)   PRAPARE - Administrator, Civil Service (Medical): No    Lack of Transportation (Non-Medical): No  Physical Activity: Not on file  Stress: Not on file  Social Connections: Not on file  Intimate Partner Violence: Not At Risk (11/28/2022)   Humiliation, Afraid, Rape, and Kick questionnaire    Fear of Current or Ex-Partner: No    Emotionally Abused: No    Physically Abused: No    Sexually Abused: No    Past Medical History, Surgical history, Social history, and Family history were reviewed and updated as appropriate.   Please see review of systems for further details on the patient's review from today.   Objective:   Physical Exam:  There were no vitals taken for this visit.  Physical Exam Constitutional:      General: She is not in acute distress. Musculoskeletal:        General: No deformity.  Neurological:     Mental Status: She is alert and oriented to person, place, and time.     Coordination: Coordination normal.  Psychiatric:        Attention and Perception: Attention and perception normal. She does not perceive auditory or visual hallucinations.        Mood and Affect: Mood normal. Mood is not anxious or depressed. Affect is not labile, blunt, angry or inappropriate.        Speech: Speech normal.        Behavior: Behavior  normal.        Thought Content: Thought content normal. Thought content is not paranoid or delusional. Thought content does not include homicidal or suicidal ideation. Thought content does not include homicidal or suicidal plan.        Cognition and Memory: Cognition and memory normal.        Judgment:  Judgment normal.     Comments: Insight intact     Lab Review:     Component Value Date/Time   NA 140 02/17/2023 1005   K 4.3 02/17/2023 1005   CL 100 02/17/2023 1005   CO2 30 02/17/2023 1005   GLUCOSE 149 (H) 02/17/2023 1005   BUN 12 02/17/2023 1005   CREATININE 0.88 02/17/2023 1005   CALCIUM 9.2 02/17/2023 1005   PROT 5.8 (L) 12/23/2021 0527   ALBUMIN 2.8 (L) 12/23/2021 0527   AST 16 12/23/2021 0527   ALT 15 12/23/2021 0527   ALKPHOS 54 12/23/2021 0527   BILITOT 0.6 12/23/2021 0527   GFRNONAA >60 02/17/2023 1005       Component Value Date/Time   WBC 8.5 02/17/2023 1005   RBC 4.65 02/17/2023 1005   HGB 12.9 02/17/2023 1005   HCT 43.0 02/17/2023 1005   PLT 245 02/17/2023 1005   MCV 92.5 02/17/2023 1005   MCH 27.7 02/17/2023 1005   MCHC 30.0 02/17/2023 1005   RDW 12.8 02/17/2023 1005   LYMPHSABS 1.5 02/17/2023 1005   MONOABS 0.5 02/17/2023 1005   EOSABS 0.1 02/17/2023 1005   BASOSABS 0.1 02/17/2023 1005    No results found for: "POCLITH", "LITHIUM"   No results found for: "PHENYTOIN", "PHENOBARB", "VALPROATE", "CBMZ"   .res Assessment: Plan:     Plan:  PDMP reviewed  Add Prozac 20mg    Prozac 40mg  daily  Lamictal 150mg  daily  Buspar 15mg  BID - PCP Wellbutrin SR 150mg  BID - PCP Trazadone 100mg  at bedtime Ativan 0.5mg  BID daily prn as needed - hasn't taken in a month  Time spent with patient was 25 minutes. Greater than 50% of face to face time with patient was spent on counseling and coordination of care.    RTC 4 weeks  Patient advised to contact office with any questions, adverse effects, or acute worsening in signs and symptoms.   Discussed potential benefits, risk, and side effects of benzodiazepines to include potential risk of tolerance and dependence, as well as possible drowsiness. Advised patient not to drive if experiencing drowsiness and to take lowest possible effective dose to minimize risk of dependence and tolerance.   There are no  diagnoses linked to this encounter.   Please see After Visit Summary for patient specific instructions.  Future Appointments  Date Time Provider Department Center  05/31/2023  2:00 PM Inigo Lantigua, Thereasa Solo, NP CP-CP None  06/01/2023  2:15 PM Lucretia Roers, MD [facility] None  10/25/2023  2:15 PM Glean Salvo, NP [facility] None    No orders of the defined types were placed in this encounter.     -------------------------------

## 2023-06-01 ENCOUNTER — Ambulatory Visit: Payer: Medicare HMO | Admitting: General Surgery

## 2023-06-01 VITALS — BP 98/65 | HR 74 | Temp 98.1°F | Resp 16 | Ht 63.0 in | Wt 206.0 lb

## 2023-06-01 DIAGNOSIS — L89310 Pressure ulcer of right buttock, unstageable: Secondary | ICD-10-CM | POA: Diagnosis not present

## 2023-06-01 MED ORDER — SULFAMETHOXAZOLE-TRIMETHOPRIM 800-160 MG PO TABS
1.0000 | ORAL_TABLET | Freq: Two times a day (BID) | ORAL | 0 refills | Status: AC
Start: 2023-06-01 — End: 2023-06-08

## 2023-06-01 NOTE — Progress Notes (Signed)
[facility]  Packing going good. Area shrinking. Still with tunnel and drainage.  BP 98/65   Pulse 74   Temp 98.1 F (36.7 C) (Oral)   Resp 16   Ht 5\' 3"  (1.6 m)   Wt 206 lb (93.4 kg)   SpO2 93%   BMI 36.49 kg/m  Probed and tunnel decreasing in size, packed, granulating and shrinking down  Patient s/p debridement of right buttock pressure ulcer doing better.   Continue packing and flushing area. If you start to have worsening pain or redness can start the antibiotic.  Sent Bactrim DS in which was sensitive to the culture from the OR and is a different antibiotic pending issues that may arise when the clinic is closed.   Future Appointments  Date Time Provider Department Center  06/22/2023  1:00 PM Lucretia Roers, MD RS[facility] None  10/25/2023  2:15 PM Glean Salvo, NP [facility] None   Algis Greenhouse, MD [facility] [address] [place], [place] [address] [phone] (office)

## 2023-06-01 NOTE — Patient Instructions (Signed)
Continue packing and flushing area. If you start to have worsening pain or redness can start the antibiotic.  Sent Bactrim DS in which was sensitive to the culture from the OR and is a different antibiotic pending issues that may arise when the clinic is closed.

## 2023-06-04 ENCOUNTER — Other Ambulatory Visit: Payer: Self-pay | Admitting: Adult Health

## 2023-06-04 DIAGNOSIS — F4310 Post-traumatic stress disorder, unspecified: Secondary | ICD-10-CM

## 2023-06-04 DIAGNOSIS — F331 Major depressive disorder, recurrent, moderate: Secondary | ICD-10-CM

## 2023-06-04 DIAGNOSIS — F411 Generalized anxiety disorder: Secondary | ICD-10-CM

## 2023-06-22 ENCOUNTER — Encounter: Payer: Self-pay | Admitting: General Surgery

## 2023-06-22 ENCOUNTER — Ambulatory Visit: Payer: Medicare Other | Admitting: General Surgery

## 2023-06-22 VITALS — BP 99/67 | HR 76 | Temp 98.2°F | Resp 12 | Ht 63.0 in | Wt 207.0 lb

## 2023-06-22 DIAGNOSIS — L89310 Pressure ulcer of right buttock, unstageable: Secondary | ICD-10-CM | POA: Diagnosis not present

## 2023-06-22 MED ORDER — SULFAMETHOXAZOLE-TRIMETHOPRIM 800-160 MG PO TABS
1.0000 | ORAL_TABLET | Freq: Two times a day (BID) | ORAL | 0 refills | Status: AC
Start: 2023-06-22 — End: 2023-06-27

## 2023-06-22 NOTE — Progress Notes (Signed)
 [facility]  Not getting packing in. Opening starting to close but still with tunnel.  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 Offered referral to wound center but she still wants to continue with me for now.   Pack wound with small amount of packing  gauze (will need to cut to size). Can put peroxide on the plain gauze. Cleanse the tunnel with saline daily after removing packing, can do peroxide to at times.   Antibiotic sent in pending any signs of redness or swelling.   Future Appointments  Date Time Provider Department Center  07/05/2023  1:30 PM Mozingo, Angeline Mattocks, NP CP-CP None  07/06/2023 12:45 PM Kallie Manuelita BROCKS, MD RS[facility] None  10/25/2023  2:15 PM Gayland Lauraine PARAS, NP [facility] None   Manuelita Kallie, MD [facility] [address] [place], [place] [address] [phone] (office)

## 2023-06-22 NOTE — Patient Instructions (Signed)
 Pack wound with small amount of packing  gauze (will need to cut to size). Can put peroxide on the plain gauze. Cleanse the tunnel with saline daily after removing packing, can do peroxide to at times.

## 2023-07-05 ENCOUNTER — Ambulatory Visit: Payer: Medicare Other | Admitting: Adult Health

## 2023-07-05 ENCOUNTER — Encounter: Payer: Self-pay | Admitting: Adult Health

## 2023-07-05 DIAGNOSIS — F331 Major depressive disorder, recurrent, moderate: Secondary | ICD-10-CM

## 2023-07-05 DIAGNOSIS — F4310 Post-traumatic stress disorder, unspecified: Secondary | ICD-10-CM

## 2023-07-05 DIAGNOSIS — F411 Generalized anxiety disorder: Secondary | ICD-10-CM | POA: Diagnosis not present

## 2023-07-05 DIAGNOSIS — G4700 Insomnia, unspecified: Secondary | ICD-10-CM

## 2023-07-05 DIAGNOSIS — F410 Panic disorder [episodic paroxysmal anxiety] without agoraphobia: Secondary | ICD-10-CM | POA: Diagnosis not present

## 2023-07-05 MED ORDER — LAMOTRIGINE 200 MG PO TABS
ORAL_TABLET | ORAL | 5 refills | Status: DC
Start: 2023-07-05 — End: 2024-01-01

## 2023-07-05 NOTE — Progress Notes (Signed)
[name] [id number] Oct 11, 1955 68 y.o.  Subjective:   Patient ID:  [name] is a 68 y.o. (DOB 15-May-1956) female.  Chief Complaint: No chief complaint on file.   HPI [name] presents to the office today for follow-up of MDD, [name], [name], insomnia, and panic attacks.  Describes mood today as "not good". Pleasant. Tearful at times. Mood symptoms - reports depression, anxiety and irritability. Reports recent panic attacks. Reports some worry, rumination, and over thinking. Mood has lower. Stating "I don't feel like I'm doing too good". Taking current medication as prescribed, but does not feel like it is helpful.  She is willing to consider other options. Taking medications as prescribed. Followed for multiple medical issues. Energy levels lower. Active, does not have a regular exercise routine with physical disabilities. Unable to enjoy usual interests and activities.  Divorced from husband of 40 years, but lives with ex-husband and his mother in law - 39 years old. Has a dog yorkie - "[name]". Has 3 grown children. Spending time with family. Appetite adequate. Weight stable - 204 pounds - 63"- Ozempic. Sleeps better some nights than others. Averages 6 to 7 hours of broken sleep. Reports focus and concentration difficulties. Completing tasks. Managing minimal aspects of household. 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  Denies AH or VH. Denies self harm. Denies substance abuse. Reporting some "paranoia" when in crowded spaces.   Chronic pain - working with pain management.  Seeing Dr. Lucia Gaskins - managing migraines  Previous medication trials: Cymbalta, Prozac, Rexulti, Vraylar,    Flowsheet Row Admission (Discharged) from 02/20/2023 in [facility] Pre-Admission Testing 60 from 02/17/2023 in [facility] Admission (Discharged) from 11/28/2022 in [facility]  C-SSRS RISK CATEGORY No Risk No  Risk No Risk        Review of Systems:  Review of Systems  Musculoskeletal:  Negative for gait problem.  Neurological:  Negative for tremors.  Psychiatric/Behavioral:         Please refer to HPI    Medications: I have reviewed the patient's current medications.  Current Outpatient Medications  Medication Sig Dispense Refill   albuterol (VENTOLIN HFA) 108 (90 Base) MCG/ACT inhaler Inhale into the lungs.     Ascorbic Acid (VITAMIN C) 1000 MG tablet Take 1,000 mg by mouth daily.     buPROPion (WELLBUTRIN SR) 150 MG 12 hr tablet Take 1 tablet (150 mg total) by mouth 2 (two) times daily. 180 tablet 3   busPIRone (BUSPAR) 10 MG tablet Take 1.5 tablets (15 mg total) by mouth 2 (two) times daily. 60 tablet 5   Cholecalciferol (VITAMIN D) 125 MCG (5000 UT) CAPS Take 2 capsules by mouth daily.     COLLAGEN PO Take 6,000 mg by mouth daily.     collagenase (SANTYL) 250 UNIT/GM ointment Apply 1 Application topically daily. Apply to deep part of wound daily pack with saline dampened gauze after 30 g 0   cyanocobalamin 2000 MCG tablet Take 2,000 mcg by mouth daily.     estradiol (ESTRACE) 0.5 MG tablet Take 0.5 mg by mouth daily.     ferrous gluconate (FERGON) 324 MG tablet Take 324 mg by mouth daily with breakfast.     fluconazole (DIFLUCAN) 150 MG tablet Take 150 mg by mouth once a week.     FLUoxetine (PROZAC) 20 MG capsule Take 1 capsule (20 mg total) by mouth daily. 90 capsule 3  FLUoxetine (PROZAC) 40 MG capsule TAKE 1 CAPSULE EVERY DAY 90 capsule 0   furosemide (LASIX) 20 MG tablet Take 20 mg by mouth every Monday, Wednesday, and Friday.     gabapentin (NEURONTIN) 600 MG tablet Take 600 mg by mouth in the morning, at noon, in the evening, and at bedtime.     Galcanezumab-gnlm (EMGALITY) 120 MG/ML SOAJ Inject 120 mg into the skin every 30 (thirty) days. 2 mL 0   Galcanezumab-gnlm (EMGALITY) 120 MG/ML SOAJ Inject 120 mg into the skin every 30 (thirty) days. 1.12 mL 11   insulin aspart  (NOVOLOG) 100 UNIT/ML injection Inject 5-8 Units into the skin 3 (three) times daily before meals.     lamoTRIgine (LAMICTAL) 150 MG tablet Take one tablet at bedtime. 30 tablet 2   LANTUS SOLOSTAR 100 UNIT/ML Solostar Pen Inject 44 Units into the skin daily. At hs     levocetirizine (XYZAL) 5 MG tablet Take 5 mg by mouth every evening.     levothyroxine (SYNTHROID) 200 MCG tablet Take 175 mcg by mouth daily before breakfast.     LORazepam (ATIVAN) 0.5 MG tablet Take one to 2 tablets daily as needed for panic attacks. 60 tablet 2   LOVASTATIN PO Take 80 mg by mouth at bedtime.     meclizine (ANTIVERT) 25 MG tablet Take 25 mg by mouth 3 (three) times daily as needed for dizziness.     montelukast (SINGULAIR) 10 MG tablet Take 10 mg by mouth daily.     ondansetron (ZOFRAN) 4 MG tablet Take 4 mg by mouth 3 (three) times daily as needed for refractory nausea / vomiting.     oxybutynin (DITROPAN) 5 MG tablet Take 5 mg by mouth every 8 (eight) hours as needed.     Oxycodone HCl 20 MG TABS Take 15 mg by mouth.     OZEMPIC, 0.25 OR 0.5 MG/DOSE, 2 MG/3ML SOPN Inject 0.5 mg into the skin once a week. Fridays     pantoprazole (PROTONIX) 40 MG tablet Take 40 mg by mouth daily.     rizatriptan (MAXALT) 10 MG tablet Take 1 tablet (10 mg total) by mouth every 6 (six) hours as needed for migraine. May repeat in 2 hours if needed 10 tablet 11   SYMBICORT 80-4.5 MCG/ACT inhaler Inhale into the lungs.     traZODone (DESYREL) 100 MG tablet TAKE 1 TABLET AT BEDTIME 90 tablet 0   zinc gluconate 50 MG tablet Take 50 mg by mouth daily.     No current facility-administered medications for this visit.    Medication Side Effects: None  Allergies:  Allergies  Allergen Reactions   Hydromorphone Other (See Comments) and Shortness Of Breath    Respiratory arrest per patient  Other reaction(s): coded with fast admin  Note: Imported from external source.   Penicillins Anaphylaxis and Other (See Comments)    Erythromycin Base Nausea And Vomiting   Ibuprofen Other (See Comments)    Per pt, it makes her bleed   Liraglutide    Meperidine Hives   Meperidine Hcl Other (See Comments)   Nsaids Other (See Comments)    GI bleed     Past Medical History:  Diagnosis Date   Anxiety    Asthma    Chronic pain    Depression    Diabetes mellitus without complication (HCC)    Fibromyalgia    GERD (gastroesophageal reflux disease)    Headache    migraines   Hyperlipidemia    Hypothyroidism  Osteoarthritis    PONV (postoperative nausea and vomiting)     Past Medical History, Surgical history, Social history, and Family history were reviewed and updated as appropriate.   Please see review of systems for further details on the patient's review from today.   Objective:   Physical Exam:  There were no vitals taken for this visit.    Lab Review:     Component Value Date/Time   NA 140 02/17/2023 1005   K 4.3 02/17/2023 1005   CL 100 02/17/2023 1005   CO2 30 02/17/2023 1005   GLUCOSE 149 (H) 02/17/2023 1005   BUN 12 02/17/2023 1005   CREATININE 0.88 02/17/2023 1005   CALCIUM 9.2 02/17/2023 1005   PROT 5.8 (L) 12/23/2021 0527   ALBUMIN 2.8 (L) 12/23/2021 0527   AST 16 12/23/2021 0527   ALT 15 12/23/2021 0527   ALKPHOS 54 12/23/2021 0527   BILITOT 0.6 12/23/2021 0527   GFRNONAA >60 02/17/2023 1005       Component Value Date/Time   WBC 8.5 02/17/2023 1005   RBC 4.65 02/17/2023 1005   HGB 12.9 02/17/2023 1005   HCT 43.0 02/17/2023 1005   PLT 245 02/17/2023 1005   MCV 92.5 02/17/2023 1005   MCH 27.7 02/17/2023 1005   MCHC 30.0 02/17/2023 1005   RDW 12.8 02/17/2023 1005   LYMPHSABS 1.5 02/17/2023 1005   MONOABS 0.5 02/17/2023 1005   EOSABS 0.1 02/17/2023 1005   BASOSABS 0.1 02/17/2023 1005    No results found for: "POCLITH", "LITHIUM"   No results found for: "PHENYTOIN", "PHENOBARB", "VALPROATE", "CBMZ"   .res Assessment: Plan:    Plan:  PDMP reviewed  D/C Prozac  20mg   Increase Lamictal 150mg  to 200mg  daily   Wellbutrin SR 150mg  BID - PCP  Buspar 15mg  BID - PCP Prozac 40mg  daily  Trazadone 100mg  at bedtime Ativan 0.5mg  BID daily prn as needed   Consider Abilify 2mg  daily.  Time spent with patient was 25 minutes.   RTC 4 weeks  Patient advised to contact office with any questions, adverse effects, or acute worsening in signs and symptoms.   Discussed potential benefits, risk, and side effects of benzodiazepines to include potential risk of tolerance and dependence, as well as possible drowsiness. Advised patient not to drive if experiencing drowsiness and to take lowest possible effective dose to minimize risk of dependence and tolerance.   There are no diagnoses linked to this encounter.   Please see After Visit Summary for patient specific instructions.  Future Appointments  Date Time Provider Department Center  07/05/2023  1:30 PM Natayla Cadenhead, Thereasa Solo, NP CP-CP None  07/06/2023 12:45 PM Lucretia Roers, MD [facility] None  10/25/2023  2:15 PM Glean Salvo, NP [facility] None    No orders of the defined types were placed in this encounter.   -------------------------------

## 2023-07-06 ENCOUNTER — Other Ambulatory Visit: Payer: Self-pay

## 2023-07-06 ENCOUNTER — Ambulatory Visit: Payer: Medicare Other | Admitting: General Surgery

## 2023-07-06 ENCOUNTER — Encounter: Payer: Self-pay | Admitting: General Surgery

## 2023-07-06 VITALS — BP 96/65 | HR 72 | Temp 98.0°F | Resp 12 | Ht 63.0 in | Wt 213.0 lb

## 2023-07-06 DIAGNOSIS — L89310 Pressure ulcer of right buttock, unstageable: Secondary | ICD-10-CM | POA: Diagnosis not present

## 2023-07-06 MED ORDER — BUPROPION HCL ER (SR) 200 MG PO TB12: 200.0000 mg | ORAL_TABLET | Freq: Two times a day (BID) | ORAL | 0 refills | Status: DC

## 2023-07-06 NOTE — Patient Instructions (Signed)
Keep packing. Peroxide and silver nitrate to area.

## 2023-07-07 NOTE — Progress Notes (Signed)
[facility]  Continues to pack the area.  Continues to have drainage. Area is less deep.   BP 96/65   Pulse 72   Temp 98 F (36.7 C) (Oral)   Resp 12   Ht 5\' 3"  (1.6 m)   Wt 213 lb (96.6 kg)   SpO2 92%   BMI 37.73 kg/m  Probed and peroxide placed. Packing in has exudative material on it No cellulitis   Patient s/p right buttock ulcer s/p debridement, long healing time. She does not want to go to wound care/ wound clinic.   Keep packing with iodoform. Peroxide and silver nitrate to area to help it close.   Algis Greenhouse, MD [facility] [address] [place] [place], [place] [address] [phone] (office)

## 2023-07-14 ENCOUNTER — Other Ambulatory Visit (HOSPITAL_COMMUNITY): Payer: Self-pay

## 2023-07-22 ENCOUNTER — Other Ambulatory Visit: Payer: Self-pay | Admitting: Adult Health

## 2023-07-22 DIAGNOSIS — F331 Major depressive disorder, recurrent, moderate: Secondary | ICD-10-CM

## 2023-07-27 ENCOUNTER — Encounter: Payer: Self-pay | Admitting: General Surgery

## 2023-07-27 ENCOUNTER — Ambulatory Visit: Payer: Medicare Other | Admitting: General Surgery

## 2023-07-27 VITALS — BP 95/62 | HR 73 | Temp 98.0°F | Resp 16 | Ht 63.0 in | Wt 212.0 lb

## 2023-07-27 DIAGNOSIS — L89310 Pressure ulcer of right buttock, unstageable: Secondary | ICD-10-CM

## 2023-07-27 NOTE — Patient Instructions (Signed)
Do packing at least daily and do silver nitrate to area daily. Call with changes.  Get packing to the bottom of the canal.  The silver nitrate stick is thinner so will help with getting packing in place.

## 2023-07-28 NOTE — Progress Notes (Signed)
[facility]  Still packing the wound. Getting smaller and less deep. Still with drainage. Difficult to get packing into small hole at times.  BP 95/62   Pulse 73   Temp 98 F (36.7 C) (Oral)   Resp 16   Ht 5\' 3"  (1.6 m)   Wt 212 lb (96.2 kg)   SpO2 92%   BMI 37.55 kg/m  Probed wound on the right buttock, contracting down, open < 1cm in size, silver nitrate done deep into cavity, minor drainage, about 3cm deep Packing 1/4 iodoform placed   Patient with chronic right buttock pressure sore. Slow to heal.  Do packing at least daily and do silver nitrate to area daily. Call with changes.  Get packing to the bottom of the canal.  The silver nitrate stick is thinner so will help with getting packing in place.   Algis Greenhouse, MD [facility] [address] [place], [place] [address] [phone] (office)

## 2023-08-07 ENCOUNTER — Telehealth: Payer: Medicare Other | Admitting: Adult Health

## 2023-08-07 ENCOUNTER — Encounter: Payer: Self-pay | Admitting: Adult Health

## 2023-08-07 DIAGNOSIS — F410 Panic disorder [episodic paroxysmal anxiety] without agoraphobia: Secondary | ICD-10-CM | POA: Diagnosis not present

## 2023-08-07 DIAGNOSIS — F411 Generalized anxiety disorder: Secondary | ICD-10-CM | POA: Diagnosis not present

## 2023-08-07 DIAGNOSIS — F4310 Post-traumatic stress disorder, unspecified: Secondary | ICD-10-CM | POA: Diagnosis not present

## 2023-08-07 DIAGNOSIS — F331 Major depressive disorder, recurrent, moderate: Secondary | ICD-10-CM

## 2023-08-07 DIAGNOSIS — G4700 Insomnia, unspecified: Secondary | ICD-10-CM

## 2023-08-07 MED ORDER — BUPROPION HCL ER (SR) 200 MG PO TB12
200.0000 mg | ORAL_TABLET | Freq: Two times a day (BID) | ORAL | 0 refills | Status: DC
Start: 2023-08-07 — End: 2023-09-05

## 2023-08-07 MED ORDER — FLUOXETINE HCL 40 MG PO CAPS
40.0000 mg | ORAL_CAPSULE | Freq: Every day | ORAL | 1 refills | Status: DC
Start: 2023-08-07 — End: 2024-03-27

## 2023-08-07 NOTE — Progress Notes (Signed)
 [name] [id number] 06/15/1955 68 y.o.  Virtual Visit via Video Note  I connected with pt @ on 08/07/23 at  2:00 PM EST by a video enabled telemedicine application and verified that I am speaking with the correct person using two identifiers.   I discussed the limitations of evaluation and management by telemedicine and the availability of in person appointments. The patient expressed understanding and agreed to proceed.  I discussed the assessment and treatment plan with the patient. The patient was provided an opportunity to ask questions and all were answered. The patient agreed with the plan and demonstrated an understanding of the instructions.   The patient was advised to call back or seek an in-person evaluation if the symptoms worsen or if the condition fails to improve as anticipated.  I provided 25 minutes of non-face-to-face time during this encounter.  The patient was located at home.  The provider was located at [facility].   Dorothyann Gibbs, NP   Subjective:   Patient ID:  [name] is a 68 y.o. (DOB May Tapia, [name].  Chief Complaint: No chief complaint on file.   [name], [name], [name], insomnia, and panic attacks.  Describes mood today as "better". Pleasant. Reports occasional tearfulness. Mood symptoms - denies depression. Reports varying interest and motivation. Reports decreased anxiety - "as long as I stay in my comfort zone". Reports irritability at times. Denies panic attacks. Reports some worry, rumination, and over thinking. Reports mild hand tremors - seeing neurologist in May. Mood has improved. Stating "I feel like I'm doing better than I was". Taking medications as prescribed. Energy levels lower. Active, does not have a regular exercise routine with physical disabilities. Unable to enjoy usual interests and activities.  Divorced from husband of 40 years, but lives with ex-husband and his  mother in law - 84 years old. Has a dog yorkie - "[name]". Has 3 grown children. Spending time with family. Appetite adequate. Weight gain - 204 to 212 pounds - 63"- Ozempic. Sleeps better some nights than others. Averages 6 to 7 hours of broken sleep. Reports focus and concentration difficulties. Completing tasks. Managing minimal aspects of household. 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  Denies AH or VH. Denies self harm. Denies substance abuse. Denies paranoia.  Chronic pain - working with pain management.  Seeing Dr. Lucia Gaskins - managing migraines  Previous medication trials: Cymbalta, Prozac, Rexulti, Vraylar,   Review of Systems:  Review of Systems  Musculoskeletal:  Negative for gait problem.  Neurological:  Negative for tremors.  Psychiatric/Behavioral:         Please refer to HPI    Medications: I have reviewed the patient's current medications.  Current Outpatient Medications  Medication Sig Dispense Refill   albuterol (VENTOLIN HFA) 108 (90 Base) MCG/ACT inhaler Inhale into the lungs.     Ascorbic Acid (VITAMIN C) 1000 MG tablet Take 1,000 mg by mouth daily.     buPROPion (WELLBUTRIN SR) 200 MG 12 hr tablet Take 1 tablet (200 mg total) by mouth 2 (two) times daily. 60 tablet 0   busPIRone (BUSPAR) 10 MG tablet Take 1.5 tablets (15 mg total) by mouth 2 (two) times daily. 60 tablet 5   Cholecalciferol (VITAMIN D) 125 MCG (5000 UT) CAPS Take 2 capsules by mouth daily.     COLLAGEN PO Take 6,000 mg by mouth daily.     collagenase (SANTYL) 250 UNIT/GM  ointment Apply 1 Application topically daily. Apply to deep part of wound daily pack with saline dampened gauze after 30 g 0   cyanocobalamin 2000 MCG tablet Take 2,000 mcg by mouth daily.     estradiol (ESTRACE) 0.5 MG tablet Take 0.5 mg by mouth daily.     ferrous gluconate (FERGON) 324 MG tablet Take 324 mg by mouth daily with breakfast.     fluconazole (DIFLUCAN) 150 MG tablet Take 150 mg by  mouth once a week.     FLUoxetine (PROZAC) 40 MG capsule TAKE 1 CAPSULE EVERY DAY 90 capsule 0   furosemide (LASIX) 20 MG tablet Take 20 mg by mouth every Monday, Wednesday, and Friday.     gabapentin (NEURONTIN) 600 MG tablet Take 600 mg by mouth in the morning, at noon, in the evening, and at bedtime.     Galcanezumab-gnlm (EMGALITY) 120 MG/ML SOAJ Inject 120 mg into the skin every 30 (thirty) days. 2 mL 0   Galcanezumab-gnlm (EMGALITY) 120 MG/ML SOAJ Inject 120 mg into the skin every 30 (thirty) days. 1.12 mL 11   insulin aspart (NOVOLOG) 100 UNIT/ML injection Inject 5-8 Units into the skin 3 (three) times daily before meals.     lamoTRIgine (LAMICTAL) 200 MG tablet Take one tablet at bedtime. 30 tablet 5   LANTUS SOLOSTAR 100 UNIT/ML Solostar Pen Inject 44 Units into the skin daily. At hs     levocetirizine (XYZAL) 5 MG tablet Take 5 mg by mouth every evening.     levothyroxine (SYNTHROID) 200 MCG tablet Take 175 mcg by mouth daily before breakfast.     LORazepam (ATIVAN) 0.5 MG tablet Take one to 2 tablets daily as needed for panic attacks. 60 tablet 2   LOVASTATIN PO Take 80 mg by mouth at bedtime.     meclizine (ANTIVERT) 25 MG tablet Take 25 mg by mouth 3 (three) times daily as needed for dizziness.     montelukast (SINGULAIR) 10 MG tablet Take 10 mg by mouth daily.     ondansetron (ZOFRAN) 4 MG tablet Take 4 mg by mouth 3 (three) times daily as needed for refractory nausea / vomiting.     oxybutynin (DITROPAN) 5 MG tablet Take 5 mg by mouth every 8 (eight) hours as needed.     Oxycodone HCl 20 MG TABS Take 15 mg by mouth.     OZEMPIC, 0.25 OR 0.5 MG/DOSE, 2 MG/3ML SOPN Inject 0.5 mg into the skin once a week. Fridays     pantoprazole (PROTONIX) 40 MG tablet Take 40 mg by mouth daily.     rizatriptan (MAXALT) 10 MG tablet Take 1 tablet (10 mg total) by mouth every 6 (six) hours as needed for migraine. May repeat in 2 hours if needed 10 tablet 11   SYMBICORT 80-4.5 MCG/ACT inhaler Inhale  into the lungs.     traZODone (DESYREL) 100 MG tablet TAKE 1 TABLET AT BEDTIME 90 tablet 0   zinc gluconate 50 MG tablet Take 50 mg by mouth daily.     No current facility-administered medications for this visit.    Medication Side Effects: None  Allergies:  Allergies  Allergen Reactions   Hydromorphone Other (See Comments) and Shortness Of Breath    Respiratory arrest per patient  Other reaction(s): coded with fast admin  Note: Imported from external source.   Penicillins Anaphylaxis and Other (See Comments)   Erythromycin Base Nausea And Vomiting   Ibuprofen Other (See Comments)    Per pt, it makes her  bleed   Liraglutide    Meperidine Hives   Meperidine Hcl Other (See Comments)   Nsaids Other (See Comments)    GI bleed     Past Medical History:  Diagnosis Date   Anxiety    Asthma    Chronic pain    Depression    Diabetes mellitus without complication (HCC)    Fibromyalgia    GERD (gastroesophageal reflux disease)    Headache    migraines   Hyperlipidemia    Hypothyroidism    Osteoarthritis    PONV (postoperative nausea and vomiting)     No family history on file.  Social History   Socioeconomic History   Marital status: Divorced    Spouse name: Not on file   Number of children: Not on file   Years of education: Not on file   Highest education level: Not on file  Occupational History   Not on file  Tobacco Use   Smoking status: Former    Types: Cigarettes    Passive exposure: Never   Smokeless tobacco: Never  Vaping Use   Vaping status: Never Used  Substance and Sexual Activity   Alcohol use: Never   Drug use: Never   Sexual activity: Not Currently    Birth control/protection: None  Other Topics Concern   Not on file  Social History Narrative   Caffiene 1-2 cups daily.   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    Social Drivers of Corporate investment banker Strain: Not on file  Food Insecurity: No Food  Insecurity (11/28/2022)   Hunger Vital Sign    Worried About Running Out of Food in the Last Year: Never true    Ran Out of Food in the Last Year: Never true  Transportation Needs: No Transportation Needs (11/28/2022)   PRAPARE - Administrator, Civil Service (Medical): No    Lack of Transportation (Non-Medical): No  Physical Activity: Not on file  Stress: Not on file  Social Connections: Not on file  Intimate Partner Violence: Not At Risk (11/28/2022)   Humiliation, Afraid, Rape, and Kick questionnaire    Fear of Current or Ex-Partner: No    Emotionally Abused: No    Physically Abused: No    Sexually Abused: No    Past Medical History, Surgical history, Social history, and Family history were reviewed and updated as appropriate.   Please see review of systems for further details on the patient's review from today.   Objective:   Physical Exam:  There were no vitals taken for this visit.  Physical Exam Constitutional:      General: She is not in acute distress. Musculoskeletal:        General: No deformity.  Neurological:     Mental Status: She is alert and oriented to person, place, and time.     Coordination: Coordination normal.  Psychiatric:        Attention and Perception: Attention and perception normal. She does not perceive auditory or visual hallucinations.        Mood and Affect: Mood normal. Mood is not anxious or depressed. Affect is not labile, blunt, angry or inappropriate.        Speech: Speech normal.        Behavior: Behavior normal.        Thought Content: Thought content normal. Thought content is not paranoid or delusional. Thought content does not include homicidal or  suicidal ideation. Thought content does not include homicidal or suicidal plan.        Cognition and Memory: Cognition and memory normal.        Judgment: Judgment normal.     Comments: Insight intact     Lab Review:     Component Value Date/Time   NA 140 09/Tapia/2024 1005    K 4.3 09/Tapia/2024 1005   CL 100 09/Tapia/2024 1005   CO2 30 09/Tapia/2024 1005   GLUCOSE 149 (H) 09/Tapia/2024 1005   BUN 12 09/Tapia/2024 1005   CREATININE 0.88 09/Tapia/2024 1005   CALCIUM 9.2 09/Tapia/2024 1005   PROT 5.8 (L) 12/23/2021 0527   ALBUMIN 2.8 (L) 12/23/2021 0527   AST 16 12/23/2021 0527   ALT 15 12/23/2021 0527   ALKPHOS 54 12/23/2021 0527   BILITOT 0.6 12/23/2021 0527   GFRNONAA >60 09/Tapia/2024 1005       Component Value Date/Time   WBC 8.5 09/Tapia/2024 1005   RBC 4.65 09/Tapia/2024 1005   HGB 12.9 09/Tapia/2024 1005   HCT 43.0 09/Tapia/2024 1005   PLT 245 09/Tapia/2024 1005   MCV 92.5 09/Tapia/2024 1005   MCH 27.7 09/Tapia/2024 1005   MCHC 30.0 09/Tapia/2024 1005   RDW 12.8 09/Tapia/2024 1005   LYMPHSABS 1.5 09/Tapia/2024 1005   MONOABS 0.5 09/Tapia/2024 1005   EOSABS 0.1 09/Tapia/2024 1005   BASOSABS 0.1 09/Tapia/2024 1005    No results found for: "POCLITH", "LITHIUM"   No results found for: "PHENYTOIN", "PHENOBARB", "VALPROATE", "CBMZ"   .res Assessment: Plan:   Plan:  PDMP reviewed  Lamictal 200mg  daily - increased since last visit.  Wellbutrin SR 200mg  BID - PCP - increased since last visit.  Buspar 15mg  BID - PCP  Prozac 40mg  daily  Trazadone 100mg  at bedtime Ativan 0.5mg  BID daily prn as needed   Consider Abilify 2mg  daily.  RTC 4 weeks  25 minutes spent dedicated to the care of this patient on the date of this encounter to include pre-visit review of records, ordering of medication, post visit documentation, and face-to-face time with the patient discussing MDD, [name], [name], insomnia, and panic attacks. Discussed continuing current medication regimen.  Patient advised to contact office with any questions, adverse effects, or acute worsening in signs and symptoms.   Discussed potential benefits, risk, and side effects of benzodiazepines to include potential risk of tolerance and dependence, as well as possible drowsiness. Advised patient not to drive if experiencing drowsiness and to take  lowest possible effective dose to minimize risk of dependence and tolerance.    Diagnoses and all orders for this visit:  Major depressive disorder, recurrent episode, moderate (HCC)  Generalized anxiety disorder  [name] (post-traumatic stress disorder)  Panic attacks  Insomnia, unspecified type     Please see After Visit Summary for patient specific instructions.  Future Appointments  Date Time Provider Department Center  08/07/2023  2:00 PM Tailor Lucking, Thereasa Solo, NP CP-CP None  08/17/2023  1:00 PM Lucretia Roers, MD RS[facility] None  10/25/2023  2:15 PM Glean Salvo, NP G[facility] None    No orders of the defined types were placed in this encounter.     -------------------------------

## 2023-08-17 ENCOUNTER — Ambulatory Visit: Payer: Medicare Other | Admitting: General Surgery

## 2023-08-17 ENCOUNTER — Encounter: Payer: Self-pay | Admitting: General Surgery

## 2023-08-17 VITALS — BP 89/59 | HR 77 | Temp 98.2°F | Resp 14 | Ht 63.0 in | Wt 211.0 lb

## 2023-08-17 DIAGNOSIS — L89310 Pressure ulcer of right buttock, unstageable: Secondary | ICD-10-CM

## 2023-08-17 NOTE — Progress Notes (Signed)
 [facility]  Right buttock wound healing, unable to pack now.   BP (!) 89/59   Pulse 77   Temp 98.2 F (36.8 C) (Oral)   Resp 14   Ht 5\' 3"  (1.6 m)   Wt 211 lb (95.7 kg)   SpO2 90%   BMI 37.38 kg/m  Silver nitrate to area Neosporin packed into the wound  Patient s/p right buttock pressure ulcer. Healing.   Continue silver nitrate stick every other day. Pack area with neosporin.   Future Appointments  Date Time Provider Department Center  09/05/2023  2:30 PM Mozingo, Thereasa Solo, NP CP-CP None  09/07/2023  1:15 PM Lucretia Roers, MD [facility] None  10/25/2023  2:15 PM Glean Salvo, NP [facility] None    Algis Greenhouse, MD [facility] [address] [place], [place] [address] [phone] (office)

## 2023-08-17 NOTE — Patient Instructions (Addendum)
 Continue silver nitrate stick every other day. Pack area with neosporin.

## 2023-08-20 NOTE — Progress Notes (Unsigned)
 Referring Provider: Waldron Session, FNP Primary Care Physician:  Waldron Session, FNP Primary GI Physician: Dr. Jena Gauss  No chief complaint on file.   HPI:   [name] is a 68 y.o. female with history of anxiety/depression, chronic pain, asthma, HTN, HLD, diabetes, GERD, IBS, colovaginal fistula, suspected episode of ischemic colitis in the setting of abdominal pain and rectal bleeding in July 2023 treated supportively while inpatient and recommended outpatient colonoscopy with primary GI [facility]), presenting today at the request of Waldron Session, FNP for IBS.  Today:    Past Medical History:  Diagnosis Date   Anxiety    Asthma    Chronic pain    Depression    Diabetes mellitus without complication (HCC)    Fibromyalgia    GERD (gastroesophageal reflux disease)    Headache    migraines   Hyperlipidemia    Hypothyroidism    Osteoarthritis    PONV (postoperative nausea and vomiting)     Past Surgical History:  Procedure Laterality Date   ABDOMINAL HYSTERECTOMY     ANKLE RECONSTRUCTION Left    1977   APPENDECTOMY     CARPAL TUNNEL RELEASE Right    2010   CATARACT EXTRACTION Left    02/2021   catheter ablation     1993   CESAREAN SECTION     x2   CHOLECYSTECTOMY     HERNIA REPAIR     INCISION AND DRAINAGE OF WOUND Right 02/20/2023   Procedure: IRRIGATION AND DEBRIDEMENT BUTTOCKS;  Surgeon: Lucretia Roers, MD;  Location: [facility];  Service: General;  Laterality: Right;   KNEE ARTHROSCOPY W/ MENISCECTOMY Right    2008   KNEE DISLOCATION SURGERY Right    1977   left carpal tunnel release      panectomy     2019   TOTAL HIP ARTHROPLASTY Right 04/12/2021   Procedure: TOTAL HIP ARTHROPLASTY ANTERIOR APPROACH;  Surgeon: Ollen Gross, MD;  Location: [facility];  Service: Orthopedics;  Laterality: Right;   TOTAL KNEE ARTHROPLASTY Right 11/28/2022   Procedure: TOTAL KNEE ARTHROPLASTY;  Surgeon: Ollen Gross, MD;  Location: [facility];  Service: Orthopedics;   Laterality: Right;   vaginal fistula of sigmoid colon      Current Outpatient Medications  Medication Sig Dispense Refill   albuterol (VENTOLIN HFA) 108 (90 Base) MCG/ACT inhaler Inhale into the lungs.     Ascorbic Acid (VITAMIN C) 1000 MG tablet Take 1,000 mg by mouth daily.     buPROPion (WELLBUTRIN SR) 200 MG 12 hr tablet Take 1 tablet (200 mg total) by mouth 2 (two) times daily. 60 tablet 0   busPIRone (BUSPAR) 10 MG tablet Take 1.5 tablets (15 mg total) by mouth 2 (two) times daily. 60 tablet 5   Cholecalciferol (VITAMIN D) 125 MCG (5000 UT) CAPS Take 2 capsules by mouth daily.     COLLAGEN PO Take 6,000 mg by mouth daily.     collagenase (SANTYL) 250 UNIT/GM ointment Apply 1 Application topically daily. Apply to deep part of wound daily pack with saline dampened gauze after 30 g 0   cyanocobalamin 2000 MCG tablet Take 2,000 mcg by mouth daily.     estradiol (ESTRACE) 0.5 MG tablet Take 0.5 mg by mouth daily.     ferrous gluconate (FERGON) 324 MG tablet Take 324 mg by mouth daily with breakfast.     fluconazole (DIFLUCAN) 150 MG tablet Take 150 mg by mouth once a week.  FLUoxetine (PROZAC) 40 MG capsule Take 1 capsule (40 mg total) by mouth daily. 90 capsule 1   furosemide (LASIX) 20 MG tablet Take 20 mg by mouth every Monday, Wednesday, and Friday.     gabapentin (NEURONTIN) 600 MG tablet Take 600 mg by mouth in the morning, at noon, in the evening, and at bedtime.     Galcanezumab-gnlm (EMGALITY) 120 MG/ML SOAJ Inject 120 mg into the skin every 30 (thirty) days. 2 mL 0   Galcanezumab-gnlm (EMGALITY) 120 MG/ML SOAJ Inject 120 mg into the skin every 30 (thirty) days. 1.12 mL 11   insulin aspart (NOVOLOG) 100 UNIT/ML injection Inject 5-8 Units into the skin 3 (three) times daily before meals.     lamoTRIgine (LAMICTAL) 200 MG tablet Take one tablet at bedtime. 30 tablet 5   LANTUS SOLOSTAR 100 UNIT/ML Solostar Pen Inject 44 Units into the skin daily. At hs     levocetirizine (XYZAL) 5  MG tablet Take 5 mg by mouth every evening.     levothyroxine (SYNTHROID) 200 MCG tablet Take 175 mcg by mouth daily before breakfast.     LORazepam (ATIVAN) 0.5 MG tablet Take one to 2 tablets daily as needed for panic attacks. 60 tablet 2   LOVASTATIN PO Take 80 mg by mouth at bedtime.     meclizine (ANTIVERT) 25 MG tablet Take 25 mg by mouth 3 (three) times daily as needed for dizziness.     montelukast (SINGULAIR) 10 MG tablet Take 10 mg by mouth daily.     ondansetron (ZOFRAN) 4 MG tablet Take 4 mg by mouth 3 (three) times daily as needed for refractory nausea / vomiting.     oxybutynin (DITROPAN) 5 MG tablet Take 5 mg by mouth every 8 (eight) hours as needed.     Oxycodone HCl 20 MG TABS Take 15 mg by mouth.     OZEMPIC, 0.25 OR 0.5 MG/DOSE, 2 MG/3ML SOPN Inject 0.5 mg into the skin once a week. Fridays     pantoprazole (PROTONIX) 40 MG tablet Take 40 mg by mouth daily.     rizatriptan (MAXALT) 10 MG tablet Take 1 tablet (10 mg total) by mouth every 6 (six) hours as needed for migraine. May repeat in 2 hours if needed 10 tablet 11   SYMBICORT 80-4.5 MCG/ACT inhaler Inhale into the lungs.     traZODone (DESYREL) 100 MG tablet TAKE 1 TABLET AT BEDTIME 90 tablet 0   zinc gluconate 50 MG tablet Take 50 mg by mouth daily.     No current facility-administered medications for this visit.    Allergies as of 08/23/2023 - Review Complete 08/17/2023  Allergen Reaction Noted   Hydromorphone Other (See Comments) and Shortness Of Breath 04/07/2012   Penicillins Anaphylaxis and Other (See Comments) 04/07/2012   Erythromycin base Nausea And Vomiting 04/07/2012   Ibuprofen Other (See Comments) 12/21/2021   Liraglutide  10/28/2021   Meperidine Hives 04/07/2012   Meperidine hcl Other (See Comments) 09/26/2022   Nsaids Other (See Comments) 10/28/2021    No family history on file.  Social History   Socioeconomic History   Marital status: Divorced    Spouse name: Not on file   Number of  children: Not on file   Years of education: Not on file   Highest education level: Not on file  Occupational History   Not on file  Tobacco Use   Smoking status: Former    Types: Cigarettes    Passive exposure: Never   Smokeless  tobacco: Never  Vaping Use   Vaping status: Never Used  Substance and Sexual Activity   Alcohol use: Never   Drug use: Never   Sexual activity: Not Currently    Birth control/protection: None  Other Topics Concern   Not on file  Social History Narrative   Caffiene 1-2 cups daily.   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    Social Drivers of Corporate investment banker Strain: Not on file  Food Insecurity: No Food Insecurity (11/28/2022)   Hunger Vital Sign    Worried About Running Out of Food in the Last Year: Never true    Ran Out of Food in the Last Year: Never true  Transportation Needs: No Transportation Needs (11/28/2022)   PRAPARE - Administrator, Civil Service (Medical): No    Lack of Transportation (Non-Medical): No  Physical Activity: Not on file  Stress: Not on file  Social Connections: Not on file    Review of Systems: Gen: Denies fever, chills, anorexia. Denies fatigue, weakness, weight loss.  CV: Denies chest pain, palpitations, syncope, peripheral edema, and claudication. Resp: Denies dyspnea at rest, cough, wheezing, coughing up blood, and pleurisy. GI: Denies vomiting blood, jaundice, and fecal incontinence.   Denies dysphagia or odynophagia. Derm: Denies rash, itching, dry skin Psych: Denies depression, anxiety, memory loss, confusion. No homicidal or suicidal ideation.  Heme: Denies bruising, bleeding, and enlarged lymph nodes.  Physical Exam: There were no vitals taken for this visit. General:   Alert and oriented. No distress noted. Pleasant and cooperative.  Head:  Normocephalic and atraumatic. Eyes:  Conjuctiva clear without scleral icterus. Heart:  S1, S2 present without  murmurs appreciated. Lungs:  Clear to auscultation bilaterally. No wheezes, rales, or rhonchi. No distress.  Abdomen:  +BS, soft, non-tender and non-distended. No rebound or guarding. No HSM or masses noted. Msk:  Symmetrical without gross deformities. Normal posture. Extremities:  Without edema. Neurologic:  Alert and  oriented x4 Psych:  Normal mood and affect.    Assessment:     Plan:  ***   Ermalinda Memos, PA-C [facility] 08/23/2023

## 2023-08-23 ENCOUNTER — Encounter: Payer: Self-pay | Admitting: Gastroenterology

## 2023-08-23 ENCOUNTER — Ambulatory Visit: Admitting: Gastroenterology

## 2023-08-23 VITALS — BP 119/74 | HR 75 | Temp 97.7°F | Ht 63.0 in | Wt 211.6 lb

## 2023-08-23 DIAGNOSIS — Z860100 Personal history of colon polyps, unspecified: Secondary | ICD-10-CM

## 2023-08-23 DIAGNOSIS — K581 Irritable bowel syndrome with constipation: Secondary | ICD-10-CM

## 2023-08-23 DIAGNOSIS — R1310 Dysphagia, unspecified: Secondary | ICD-10-CM | POA: Diagnosis not present

## 2023-08-23 DIAGNOSIS — K219 Gastro-esophageal reflux disease without esophagitis: Secondary | ICD-10-CM

## 2023-08-23 DIAGNOSIS — Z8719 Personal history of other diseases of the digestive system: Secondary | ICD-10-CM

## 2023-08-23 DIAGNOSIS — R110 Nausea: Secondary | ICD-10-CM | POA: Diagnosis not present

## 2023-08-23 DIAGNOSIS — R1013 Epigastric pain: Secondary | ICD-10-CM

## 2023-08-23 MED ORDER — PANTOPRAZOLE SODIUM 40 MG PO TBEC
40.0000 mg | DELAYED_RELEASE_TABLET | Freq: Two times a day (BID) | ORAL | 3 refills | Status: DC
Start: 2023-08-23 — End: 2024-01-04

## 2023-08-23 MED ORDER — ONDANSETRON HCL 4 MG PO TABS: 4.0000 mg | ORAL_TABLET | Freq: Three times a day (TID) | ORAL | 1 refills | Status: AC | PRN

## 2023-08-23 NOTE — Patient Instructions (Signed)
 We will get you scheduled for colonoscopy and upper endoscopy with possible stretching of your esophagus in the near future with Dr. Jena Gauss. We will have you complete 1.5 bowel preps due to poor prep previously.  Prior to starting your bowel prep, go ahead and take Zofran to ward off any nausea.  You can use Zofran throughout your bowel prep every 8 hours as needed.  For management of constipation: Increase stool softeners to 2 pills every morning.  If this is not allowing you to have good productive bowel movements daily, you can take 2 pills in the morning and 1 pill in the evening.  If you continue without having bowel movements daily, you can switch stool softeners to MiraLAX 17 g in 8 ounces of water daily.  If you continue to struggle with some constipation, please let me know so we can try prescriptive agent.  For nausea and breakthrough heartburn: Increase pantoprazole to 40 mg twice daily 30 minutes before breakfast and dinner. Follow a GERD diet/lifestyle:  Avoid fried, fatty, greasy, spicy, citrus foods. Avoid caffeine and carbonated beverages. Avoid chocolate. Try eating 4-6 small meals a day rather than 3 large meals. Do not eat within 3 hours of laying down. Prop head of bed up on wood or bricks to create a 6 inch incline.   I will plan to see back in the office after your procedures.  Do not hesitate to call sooner if you have questions or concerns.  It was nice to meet you today!  Ermalinda Memos, PA-C [facility]

## 2023-08-24 ENCOUNTER — Encounter: Payer: Self-pay | Admitting: Gastroenterology

## 2023-09-04 ENCOUNTER — Telehealth: Payer: Self-pay | Admitting: Internal Medicine

## 2023-09-04 NOTE — Telephone Encounter (Signed)
 Pt informed waiting on providers May schedule and once we get his schedule will give her a call. Verbalized understanding.

## 2023-09-04 NOTE — Telephone Encounter (Signed)
 Patient brought in her release for medical records and was asking about being scheduled for colonoscopy.  She said she was just here and was told that she would be called but she said she hadn't heard from anyone and I didn't see any notes.

## 2023-09-05 ENCOUNTER — Telehealth: Payer: Medicare Other | Admitting: Adult Health

## 2023-09-05 ENCOUNTER — Encounter: Payer: Self-pay | Admitting: Adult Health

## 2023-09-05 DIAGNOSIS — F331 Major depressive disorder, recurrent, moderate: Secondary | ICD-10-CM

## 2023-09-05 DIAGNOSIS — G4700 Insomnia, unspecified: Secondary | ICD-10-CM | POA: Diagnosis not present

## 2023-09-05 DIAGNOSIS — F410 Panic disorder [episodic paroxysmal anxiety] without agoraphobia: Secondary | ICD-10-CM | POA: Diagnosis not present

## 2023-09-05 DIAGNOSIS — F411 Generalized anxiety disorder: Secondary | ICD-10-CM | POA: Diagnosis not present

## 2023-09-05 MED ORDER — BUPROPION HCL ER (SR) 200 MG PO TB12
200.0000 mg | ORAL_TABLET | Freq: Two times a day (BID) | ORAL | 5 refills | Status: DC
Start: 2023-09-05 — End: 2024-01-01

## 2023-09-05 MED ORDER — LORAZEPAM 0.5 MG PO TABS
ORAL_TABLET | ORAL | 2 refills | Status: DC
Start: 2023-09-05 — End: 2024-04-09

## 2023-09-05 MED ORDER — TRAZODONE HCL 100 MG PO TABS
100.0000 mg | ORAL_TABLET | Freq: Every day | ORAL | 0 refills | Status: DC
Start: 2023-09-05 — End: 2024-04-12

## 2023-09-05 NOTE — Progress Notes (Signed)
 [name] [id number] [birth year] 68 y.o.  Virtual Visit via Video Note  I connected with pt @ on 09/05/23 at  2:30 PM EDT by a video enabled telemedicine application and verified that I am speaking with the correct person using two identifiers.   I discussed the limitations of evaluation and management by telemedicine and the availability of in person appointments. The patient expressed understanding and agreed to proceed.  I discussed the assessment and treatment plan with the patient. The patient was provided an opportunity to ask questions and all were answered. The patient agreed with the plan and demonstrated an understanding of the instructions.   The patient was advised to call back or seek an in-person evaluation if the symptoms worsen or if the condition fails to improve as anticipated.  I provided 25 minutes of non-face-to-face time during this encounter.  The patient was located at home.  The provider was located at [facility].   Dorothyann Gibbs, NP   Subjective:   Patient ID:  [name] is a 68 y.o. (DOB 1956-01-31) female.  Chief Complaint: No chief complaint on file.   [name], [name], [name], insomnia, and panic attacks.  Describes mood today as "better". Pleasant. Reports  tearfulness - 1 to 2 times a week. Mood symptoms - reports decreased depression. Reports improved interest and motivation. Reports decreased anxiety - "every once in a while". Reports decreased irritability. Denies panic attacks. Reports some worry - "not as much as it was - that's who I am". Denies rumination and over thinking. Reports mild hand tremors - still there - not as bad - seeing neurologist in May. Mood has improved. Stating "I feel like I'm doing better". Taking medications as prescribed. Energy levels improved. Active, does not have a regular exercise routine with physical disabilities. Enjoys some usual interests and activities -  reports doing better in social situations. Divorced from husband of 40 years, but lives with ex-husband and his mother in law - 2 years old. Has a dog yorkie - "[name]". Has 3 grown children. Spending time with family. Appetite adequate. Weight loss - 211 pounds - 63"- Ozempic. Sleeps better some nights than others. Averages 6 hours of broken sleep. Reports focus and concentration has improved. Completing tasks. Managing minimal aspects of household. 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  Denies AH or VH. Denies self harm. Denies substance abuse. Denies paranoia.  Chronic pain - working with pain management.  Seeing Dr. Lucia Gaskins - managing migraines  Previous medication trials: Cymbalta, Prozac, Rexulti, Vraylar,    Review of Systems:  Review of Systems  Musculoskeletal:  Negative for gait problem.  Neurological:  Negative for tremors.  Psychiatric/Behavioral:         Please refer to HPI    Medications: I have reviewed the patient's current medications.  Current Outpatient Medications  Medication Sig Dispense Refill   albuterol (VENTOLIN HFA) 108 (90 Base) MCG/ACT inhaler Inhale into the lungs.     Ascorbic Acid (VITAMIN C) 1000 MG tablet Take 1,000 mg by mouth daily.     buPROPion (WELLBUTRIN SR) 200 MG 12 hr tablet Take 1 tablet (200 mg total) by mouth 2 (two) times daily. 60 tablet 5   busPIRone (BUSPAR) 10 MG tablet Take 1.5 tablets (15 mg total) by mouth 2 (two) times daily. 60 tablet 5   Cholecalciferol (VITAMIN D) 125 MCG (5000 UT) CAPS Take 2 capsules  by mouth daily.     COLLAGEN PO Take 6,000 mg by mouth daily.     collagenase (SANTYL) 250 UNIT/GM ointment Apply 1 Application topically daily. Apply to deep part of wound daily pack with saline dampened gauze after 30 g 0   cyanocobalamin 2000 MCG tablet Take 2,000 mcg by mouth daily.     estradiol (ESTRACE) 0.5 MG tablet Take 0.5 mg by mouth daily.     ferrous gluconate (FERGON) 324 MG tablet  Take 324 mg by mouth daily with breakfast.     fluconazole (DIFLUCAN) 150 MG tablet Take 150 mg by mouth once a week.     FLUoxetine (PROZAC) 40 MG capsule Take 1 capsule (40 mg total) by mouth daily. 90 capsule 1   furosemide (LASIX) 20 MG tablet Take 20 mg by mouth every Monday, Wednesday, and Friday.     gabapentin (NEURONTIN) 600 MG tablet Take 600 mg by mouth in the morning, at noon, in the evening, and at bedtime.     Galcanezumab-gnlm (EMGALITY) 120 MG/ML SOAJ Inject 120 mg into the skin every 30 (thirty) days. 1.12 mL 11   insulin aspart (NOVOLOG) 100 UNIT/ML injection Inject 5-8 Units into the skin 3 (three) times daily before meals.     lamoTRIgine (LAMICTAL) 200 MG tablet Take one tablet at bedtime. 30 tablet 5   LANTUS SOLOSTAR 100 UNIT/ML Solostar Pen Inject 46 Units into the skin daily. At hs     levocetirizine (XYZAL) 5 MG tablet Take 5 mg by mouth every evening.     levothyroxine (SYNTHROID) 200 MCG tablet Take 175 mcg by mouth daily before breakfast.     LORazepam (ATIVAN) 0.5 MG tablet Take one to 2 tablets daily as needed for panic attacks. 60 tablet 2   LOVASTATIN PO Take 80 mg by mouth at bedtime.     meclizine (ANTIVERT) 25 MG tablet Take 25 mg by mouth 3 (three) times daily as needed for dizziness.     montelukast (SINGULAIR) 10 MG tablet Take 10 mg by mouth daily.     ondansetron (ZOFRAN) 4 MG tablet Take 1 tablet (4 mg total) by mouth every 8 (eight) hours as needed for refractory nausea / vomiting. 20 tablet 1   oxybutynin (DITROPAN) 5 MG tablet Take 5 mg by mouth every 8 (eight) hours as needed.     Oxycodone HCl 20 MG TABS Take 15 mg by mouth.     OZEMPIC, 0.25 OR 0.5 MG/DOSE, 2 MG/3ML SOPN Inject 2 mg into the skin once a week. Wednesdays     pantoprazole (PROTONIX) 40 MG tablet Take 1 tablet (40 mg total) by mouth 2 (two) times daily before a meal. 60 tablet 3   rizatriptan (MAXALT) 10 MG tablet Take 1 tablet (10 mg total) by mouth every 6 (six) hours as needed for  migraine. May repeat in 2 hours if needed 10 tablet 11   SYMBICORT 80-4.5 MCG/ACT inhaler Inhale into the lungs.     traZODone (DESYREL) 100 MG tablet Take 1 tablet (100 mg total) by mouth at bedtime. 90 tablet 0   zinc gluconate 50 MG tablet Take 50 mg by mouth daily.     No current facility-administered medications for this visit.    Medication Side Effects: None  Allergies:  Allergies  Allergen Reactions   Hydromorphone Other (See Comments) and Shortness Of Breath    Respiratory arrest per patient  Other reaction(s): coded with fast admin  Note: Imported from external source.   Penicillins  Anaphylaxis and Other (See Comments)   Erythromycin Base Nausea And Vomiting   Ibuprofen Other (See Comments)    Per pt, it makes her bleed   Liraglutide    Meperidine Hives   Meperidine Hcl Other (See Comments)   Nsaids Other (See Comments)    GI bleed     Past Medical History:  Diagnosis Date   Anxiety    Asthma    Chronic pain    Depression    Diabetes mellitus without complication (HCC)    Fibromyalgia    GERD (gastroesophageal reflux disease)    Headache    migraines   Hyperlipidemia    Hypothyroidism    Osteoarthritis    PONV (postoperative nausea and vomiting)     No family history on file.  Social History   Socioeconomic History   Marital status: Divorced    Spouse name: Not on file   Number of children: Not on file   Years of education: Not on file   Highest education level: Not on file  Occupational History   Not on file  Tobacco Use   Smoking status: Former    Types: Cigarettes    Passive exposure: Never   Smokeless tobacco: Never  Vaping Use   Vaping status: Never Used  Substance and Sexual Activity   Alcohol use: Never   Drug use: Never   Sexual activity: Not Currently    Birth control/protection: None  Other Topics Concern   Not on file  Social History Narrative   Caffiene 1-2 cups daily.   Disabilty from accident, RN   Lives husband   3  kids,   8 grandkids.    Social Drivers of Corporate investment banker Strain: Not on file  Food Insecurity: No Food Insecurity (11/28/2022)   Hunger Vital Sign    Worried About Running Out of Food in the Last Year: Never true    Ran Out of Food in the Last Year: Never true  Transportation Needs: No Transportation Needs (11/28/2022)   PRAPARE - Administrator, Civil Service (Medical): No    Lack of Transportation (Non-Medical): No  Physical Activity: Not on file  Stress: Not on file  Social Connections: Not on file  Intimate Partner Violence: Not At Risk (11/28/2022)   Humiliation, Afraid, Rape, and Kick questionnaire    Fear of Current or Ex-Partner: No    Emotionally Abused: No    Physically Abused: No    Sexually Abused: No    Past Medical History, Surgical history, Social history, and Family history were reviewed and updated as appropriate.   Please see review of systems for further details on the patient's review from today.   Objective:   Physical Exam:  There were no vitals taken for this visit.  Physical Exam Constitutional:      General: She is not in acute distress. Musculoskeletal:        General: No deformity.  Neurological:     Mental Status: She is alert and oriented to person, place, and time.     Coordination: Coordination normal.  Psychiatric:        Attention and Perception: Attention and perception normal. She does not perceive auditory or visual hallucinations.        Mood and Affect: Affect is not labile, blunt, angry or inappropriate.        Speech: Speech normal.        Behavior: Behavior normal.        Thought  Content: Thought content normal. Thought content is not paranoid or delusional. Thought content does not include homicidal or suicidal ideation. Thought content does not include homicidal or suicidal plan.        Cognition and Memory: Cognition and memory normal.        Judgment: Judgment normal.     Comments: Insight intact      Lab Review:     Component Value Date/Time   NA 140 02/17/2023 1005   K 4.3 02/17/2023 1005   CL 100 02/17/2023 1005   CO2 30 02/17/2023 1005   GLUCOSE 149 (H) 02/17/2023 1005   BUN 12 02/17/2023 1005   CREATININE 0.88 02/17/2023 1005   CALCIUM 9.2 02/17/2023 1005   PROT 5.8 (L) 12/23/2021 0527   ALBUMIN 2.8 (L) 12/23/2021 0527   AST 16 12/23/2021 0527   ALT 15 12/23/2021 0527   ALKPHOS 54 12/23/2021 0527   BILITOT 0.6 12/23/2021 0527   GFRNONAA >60 02/17/2023 1005       Component Value Date/Time   WBC 8.5 02/17/2023 1005   RBC 4.65 02/17/2023 1005   HGB 12.9 02/17/2023 1005   HCT 43.0 02/17/2023 1005   PLT 245 02/17/2023 1005   MCV 92.5 02/17/2023 1005   MCH 27.7 02/17/2023 1005   MCHC 30.0 02/17/2023 1005   RDW 12.8 02/17/2023 1005   LYMPHSABS 1.5 02/17/2023 1005   MONOABS 0.5 02/17/2023 1005   EOSABS 0.1 02/17/2023 1005   BASOSABS 0.1 02/17/2023 1005    No results found for: "POCLITH", "LITHIUM"   No results found for: "PHENYTOIN", "PHENOBARB", "VALPROATE", "CBMZ"   .res Assessment: Plan:    Plan:  PDMP reviewed  Wellbutrin SR 200mg  BID - PCP - increased since last visit.  Buspar 15mg  BID - PCP  Lamictal 200mg  daily Prozac 40mg  daily  Trazadone 100mg  to 150mg  at bedtime Ativan 0.5mg  BID daily prn as needed   Consider Abilify 2mg  daily.  RTC 4 weeks  25 minutes spent dedicated to the care of this patient on the date of this encounter to include pre-visit review of records, ordering of medication, post visit documentation, and face-to-face time with the patient discussing MDD, [name], [name], insomnia, and panic attacks. Discussed continuing current medication regimen.  Patient advised to contact office with any questions, adverse effects, or acute worsening in signs and symptoms.   Discussed potential benefits, risk, and side effects of benzodiazepines to include potential risk of tolerance and dependence, as well as possible drowsiness. Advised  patient not to drive if experiencing drowsiness and to take lowest possible effective dose to minimize risk of dependence and tolerance.   Discussed potential metabolic side effects associated with atypical antipsychotics, as well as potential risk for movement side effects. Advised pt to contact office if movement side effects occur.    Diagnoses and all orders for this visit:  Major depressive disorder, recurrent episode, moderate (HCC) -     buPROPion (WELLBUTRIN SR) 200 MG 12 hr tablet; Take 1 tablet (200 mg total) by mouth 2 (two) times daily.  Generalized anxiety disorder -     LORazepam (ATIVAN) 0.5 MG tablet; Take one to 2 tablets daily as needed for panic attacks.  Panic attacks -     LORazepam (ATIVAN) 0.5 MG tablet; Take one to 2 tablets daily as needed for panic attacks.  Insomnia, unspecified type -     traZODone (DESYREL) 100 MG tablet; Take 1 tablet (100 mg total) by mouth at bedtime.     Please see After  Visit Summary for patient specific instructions.  Future Appointments  Date Time Provider Department Center  09/06/2023 10:15 AM Lucretia Roers, MD [facility] None  10/25/2023  2:15 PM Glean Salvo, NP [facility] None    No orders of the defined types were placed in this encounter.     -------------------------------

## 2023-09-06 ENCOUNTER — Ambulatory Visit: Admitting: General Surgery

## 2023-09-06 ENCOUNTER — Encounter: Payer: Self-pay | Admitting: General Surgery

## 2023-09-06 VITALS — BP 106/68 | HR 75 | Temp 98.2°F | Resp 16 | Ht 63.0 in | Wt 213.0 lb

## 2023-09-06 DIAGNOSIS — L89310 Pressure ulcer of right buttock, unstageable: Secondary | ICD-10-CM | POA: Diagnosis not present

## 2023-09-06 NOTE — Patient Instructions (Signed)
 Continue to irrigate wound and try to do the silver nitrate into the area to keep it from closing up too soon. Can put some neosporin into the cavity/ on wound daily too

## 2023-09-06 NOTE — Progress Notes (Signed)
 [facility]  Doing well. Not able to pack area but is able to irrigate and do some silver nitrate.  BP 106/68   Pulse 75   Temp 98.2 F (36.8 C) (Oral)   Resp 16   Ht 5\' 3"  (1.6 m)   Wt 213 lb (96.6 kg)   SpO2 93%   BMI 37.73 kg/m  2-2.5cm deep, <0.5cm wide, less indurated and less tender  Patient s/p debridement of right buttock wound. Doing better.    Continue to irrigate wound and try to do the silver nitrate into the area to keep it from closing up too soon. Can put some neosporin into the cavity/ on wound daily too   Future Appointments  Date Time Provider Department Center  10/04/2023  3:30 PM Mozingo, Thereasa Solo, NP [facility] None  10/05/2023  2:15 PM Lucretia Roers, MD [facility] None  10/25/2023  2:15 PM Glean Salvo, NP [facility] None   Algis Greenhouse, MD [facility] [address] [place], [place] [address] [phone] (office)

## 2023-09-07 ENCOUNTER — Ambulatory Visit: Admitting: General Surgery

## 2023-09-20 ENCOUNTER — Encounter: Payer: Self-pay | Admitting: *Deleted

## 2023-09-20 ENCOUNTER — Other Ambulatory Visit: Payer: Self-pay | Admitting: *Deleted

## 2023-09-20 MED ORDER — NA SULFATE-K SULFATE-MG SULF 17.5-3.13-1.6 GM/177ML PO SOLN: ORAL | 0 refills | Status: DC

## 2023-09-21 ENCOUNTER — Telehealth (INDEPENDENT_AMBULATORY_CARE_PROVIDER_SITE_OTHER): Payer: Self-pay | Admitting: Gastroenterology

## 2023-09-21 NOTE — Telephone Encounter (Signed)
 Error

## 2023-09-24 ENCOUNTER — Telehealth: Payer: Self-pay | Admitting: Gastroenterology

## 2023-09-24 NOTE — Telephone Encounter (Signed)
 Received EGD and colonoscopy path report from [facility] dated 03/10/22.  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  Also with gastric intestinal metaplasia, candida esophagitis.  Colonoscopy with 2 tubular adenomas.   No additional recommendations at this time. Will proceed with EGD and colonoscopy as planned.

## 2023-10-04 ENCOUNTER — Telehealth: Admitting: Adult Health

## 2023-10-04 ENCOUNTER — Encounter: Payer: Self-pay | Admitting: Adult Health

## 2023-10-04 DIAGNOSIS — F411 Generalized anxiety disorder: Secondary | ICD-10-CM

## 2023-10-04 DIAGNOSIS — F4310 Post-traumatic stress disorder, unspecified: Secondary | ICD-10-CM

## 2023-10-04 DIAGNOSIS — G4700 Insomnia, unspecified: Secondary | ICD-10-CM

## 2023-10-04 DIAGNOSIS — F410 Panic disorder [episodic paroxysmal anxiety] without agoraphobia: Secondary | ICD-10-CM | POA: Diagnosis not present

## 2023-10-04 DIAGNOSIS — F331 Major depressive disorder, recurrent, moderate: Secondary | ICD-10-CM

## 2023-10-04 NOTE — Progress Notes (Signed)
 [name] [id number] 07-16-1955 68 y.o.  Virtual Visit via Video Note  I connected with pt @ on 10/04/23 at  3:30 PM EDT by a video enabled telemedicine application and verified that I am speaking with the correct person using two identifiers.   I discussed the limitations of evaluation and management by telemedicine and the availability of in person appointments. The patient expressed understanding and agreed to proceed.  I discussed the assessment and treatment plan with the patient. The patient was provided an opportunity to ask questions and all were answered. The patient agreed with the plan and demonstrated an understanding of the instructions.   The patient was advised to call back or seek an in-person evaluation if the symptoms worsen or if the condition fails to improve as anticipated.  I provided 25 minutes of non-face-to-face time during this encounter.  The patient was located at home.  The provider was located at [facility].   Reagan Camera, NP   Subjective:   Patient ID:  [name] is a 68 y.o. (DOB 1956/04/04) female.  Chief Complaint: No chief complaint on file.   [name], [name], [name], insomnia and panic attacks.  Describes mood today as "better". Pleasant. Reports  tearfulness - "crys easily". Mood symptoms - reports decreased depression. Reports improved interest and motivation. Reports increased anxiety and irritability - "more sporadic". Denies panic attacks. Reports some worry, rumination and over thinking. Reports mild hand tremors - improving. Mood is stable. Stating "overall, I feel like I'm doing ok". Taking medications as prescribed. Energy levels improved. Active, does not have a regular exercise routine with physical disabilities. Enjoys some usual interests and activities - reports doing better in social situations. Divorced from husband of 40 years, but lives with ex-husband and his mother in  law - 54 years old. Has a dog yorkie - "[name]". Has 3 grown children. Spending time with family. Appetite adequate. Weight gain - 211 pounds - 63"- taking Ozempic - changing to Monjauro. Sleeps better some nights than others. Averages 6 to 7 hours. Reports focus and concentration improved. Completing tasks. Managing minimal aspects of household. 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  Denies AH or VH. Denies self harm. Denies substance abuse. Denies paranoia.  Chronic pain - working with pain management.  Seeing Dr. Tresia Fruit - managing migraines  Previous medication trials: Cymbalta, Prozac , Rexulti , Vraylar ,    Review of Systems:  Review of Systems  Musculoskeletal:  Negative for gait problem.  Neurological:  Negative for tremors.  Psychiatric/Behavioral:         Please refer to HPI    Medications: I have reviewed the patient's current medications.  Current Outpatient Medications  Medication Sig Dispense Refill   albuterol  (VENTOLIN  HFA) 108 (90 Base) MCG/ACT inhaler Inhale into the lungs.     Ascorbic Acid (VITAMIN C) 1000 MG tablet Take 1,000 mg by mouth daily.     buPROPion  (WELLBUTRIN  SR) 200 MG 12 hr tablet Take 1 tablet (200 mg total) by mouth 2 (two) times daily. 60 tablet 5   busPIRone  (BUSPAR ) 10 MG tablet Take 1.5 tablets (15 mg total) by mouth 2 (two) times daily. 60 tablet 5   Cholecalciferol (VITAMIN D) 125 MCG (5000 UT) CAPS Take 2 capsules by mouth daily.     COLLAGEN PO Take 6,000 mg by mouth daily.     collagenase  (SANTYL ) 250 UNIT/GM ointment Apply 1 Application  topically daily. Apply to deep part of wound daily pack with saline dampened gauze after 30 g 0   cyanocobalamin 2000 MCG tablet Take 2,000 mcg by mouth daily.     estradiol (ESTRACE) 0.5 MG tablet Take 0.5 mg by mouth daily.     ferrous gluconate  (FERGON) 324 MG tablet Take 324 mg by mouth daily with breakfast.     fluconazole (DIFLUCAN) 150 MG tablet Take 150 mg by mouth  once a week.     FLUoxetine  (PROZAC ) 40 MG capsule Take 1 capsule (40 mg total) by mouth daily. 90 capsule 1   furosemide  (LASIX ) 20 MG tablet Take 20 mg by mouth every Monday, Wednesday, and Friday.     gabapentin  (NEURONTIN ) 600 MG tablet Take 600 mg by mouth in the morning, at noon, in the evening, and at bedtime.     Galcanezumab -gnlm (EMGALITY ) 120 MG/ML SOAJ Inject 120 mg into the skin every 30 (thirty) days. 1.12 mL 11   insulin  aspart (NOVOLOG ) 100 UNIT/ML injection Inject 5-8 Units into the skin 3 (three) times daily before meals.     lamoTRIgine  (LAMICTAL ) 200 MG tablet Take one tablet at bedtime. 30 tablet 5   LANTUS  SOLOSTAR 100 UNIT/ML Solostar Pen Inject 46 Units into the skin daily. At hs     levocetirizine (XYZAL ) 5 MG tablet Take 5 mg by mouth every evening.     levothyroxine  (SYNTHROID ) 200 MCG tablet Take 175 mcg by mouth daily before breakfast.     LORazepam  (ATIVAN ) 0.5 MG tablet Take one to 2 tablets daily as needed for panic attacks. 60 tablet 2   LOVASTATIN PO Take 80 mg by mouth at bedtime.     meclizine  (ANTIVERT ) 25 MG tablet Take 25 mg by mouth 3 (three) times daily as needed for dizziness.     montelukast  (SINGULAIR ) 10 MG tablet Take 10 mg by mouth daily.     Na Sulfate-K Sulfate-Mg Sulfate concentrate (SUPREP) 17.5-3.13-1.6 GM/177ML SOLN As directed 708 mL 0   ondansetron  (ZOFRAN ) 4 MG tablet Take 1 tablet (4 mg total) by mouth every 8 (eight) hours as needed for refractory nausea / vomiting. 20 tablet 1   oxybutynin  (DITROPAN ) 5 MG tablet Take 5 mg by mouth every 8 (eight) hours as needed.     Oxycodone  HCl 20 MG TABS Take 15 mg by mouth.     OZEMPIC, 0.25 OR 0.5 MG/DOSE, 2 MG/3ML SOPN Inject 2 mg into the skin once a week. Wednesdays     pantoprazole  (PROTONIX ) 40 MG tablet Take 1 tablet (40 mg total) by mouth 2 (two) times daily before a meal. 60 tablet 3   rizatriptan  (MAXALT ) 10 MG tablet Take 1 tablet (10 mg total) by mouth every 6 (six) hours as needed for  migraine. May repeat in 2 hours if needed 10 tablet 11   SYMBICORT 80-4.5 MCG/ACT inhaler Inhale into the lungs.     traZODone  (DESYREL ) 100 MG tablet Take 1 tablet (100 mg total) by mouth at bedtime. 90 tablet 0   zinc gluconate 50 MG tablet Take 50 mg by mouth daily.     No current facility-administered medications for this visit.    Medication Side Effects: None  Allergies:  Allergies  Allergen Reactions   Hydromorphone Other (See Comments) and Shortness Of Breath    Respiratory arrest per patient  Other reaction(s): coded with fast admin  Note: Imported from external source.   Penicillins Anaphylaxis and Other (See Comments)   Erythromycin Base Nausea And  Vomiting   Ibuprofen Other (See Comments)    Per pt, it makes her bleed   Liraglutide    Meperidine  Hives   Meperidine  Hcl Other (See Comments)   Nsaids Other (See Comments)    GI bleed     Past Medical History:  Diagnosis Date   Anxiety    Asthma    Chronic pain    Depression    Diabetes mellitus without complication (HCC)    Fibromyalgia    GERD (gastroesophageal reflux disease)    Headache    migraines   Hyperlipidemia    Hypothyroidism    Osteoarthritis    PONV (postoperative nausea and vomiting)     No family history on file.  Social History   Socioeconomic History   Marital status: Divorced    Spouse name: Not on file   Number of children: Not on file   Years of education: Not on file   Highest education level: Not on file  Occupational History   Not on file  Tobacco Use   Smoking status: Former    Types: Cigarettes    Passive exposure: Never   Smokeless tobacco: Never  Vaping Use   Vaping status: Never Used  Substance and Sexual Activity   Alcohol use: Never   Drug use: Never   Sexual activity: Not Currently    Birth control/protection: None  Other Topics Concern   Not on file  Social History Narrative   Caffiene 1-2 cups daily.   Disabilty from accident, RN   Lives husband   3  kids,   8 grandkids.    Social Drivers of Corporate investment banker Strain: Not on file  Food Insecurity: No Food Insecurity (11/28/2022)   Hunger Vital Sign    Worried About Running Out of Food in the Last Year: Never true    Ran Out of Food in the Last Year: Never true  Transportation Needs: No Transportation Needs (11/28/2022)   PRAPARE - Administrator, Civil Service (Medical): No    Lack of Transportation (Non-Medical): No  Physical Activity: Not on file  Stress: Not on file  Social Connections: Not on file  Intimate Partner Violence: Not At Risk (11/28/2022)   Humiliation, Afraid, Rape, and Kick questionnaire    Fear of Current or Ex-Partner: No    Emotionally Abused: No    Physically Abused: No    Sexually Abused: No    Past Medical History, Surgical history, Social history, and Family history were reviewed and updated as appropriate.   Please see review of systems for further details on the patient's review from today.   Objective:   Physical Exam:  There were no vitals taken for this visit.  Physical Exam Constitutional:      General: She is not in acute distress. Musculoskeletal:        General: No deformity.  Neurological:     Mental Status: She is alert and oriented to person, place, and time.     Coordination: Coordination normal.  Psychiatric:        Attention and Perception: Attention and perception normal. She does not perceive auditory or visual hallucinations.        Mood and Affect: Mood normal. Affect is not labile, blunt, angry or inappropriate.        Speech: Speech normal.        Behavior: Behavior normal.        Thought Content: Thought content normal. Thought content is not paranoid  or delusional. Thought content does not include homicidal or suicidal ideation. Thought content does not include homicidal or suicidal plan.        Cognition and Memory: Cognition and memory normal.        Judgment: Judgment normal.     Comments: Insight  intact     Lab Review:     Component Value Date/Time   NA 140 02/17/2023 1005   K 4.3 02/17/2023 1005   CL 100 02/17/2023 1005   CO2 30 02/17/2023 1005   GLUCOSE 149 (H) 02/17/2023 1005   BUN 12 02/17/2023 1005   CREATININE 0.88 02/17/2023 1005   CALCIUM  9.2 02/17/2023 1005   PROT 5.8 (L) 12/23/2021 0527   ALBUMIN 2.8 (L) 12/23/2021 0527   AST 16 12/23/2021 0527   ALT 15 12/23/2021 0527   ALKPHOS 54 12/23/2021 0527   BILITOT 0.6 12/23/2021 0527   GFRNONAA >60 02/17/2023 1005       Component Value Date/Time   WBC 8.5 02/17/2023 1005   RBC 4.65 02/17/2023 1005   HGB 12.9 02/17/2023 1005   HCT 43.0 02/17/2023 1005   PLT 245 02/17/2023 1005   MCV 92.5 02/17/2023 1005   MCH 27.7 02/17/2023 1005   MCHC 30.0 02/17/2023 1005   RDW 12.8 02/17/2023 1005   LYMPHSABS 1.5 02/17/2023 1005   MONOABS 0.5 02/17/2023 1005   EOSABS 0.1 02/17/2023 1005   BASOSABS 0.1 02/17/2023 1005    No results found for: "POCLITH", "LITHIUM"   No results found for: "PHENYTOIN", "PHENOBARB", "VALPROATE", "CBMZ"   .res Assessment: Plan:    Plan:  PDMP reviewed  Wellbutrin  SR 200mg  BID - PCP - increased since last visit.  Buspar  15mg  BID - PCP  Lamictal  200mg  daily Prozac  40mg  daily  Trazadone 100mg  to 150mg  at bedtime Ativan  0.5mg  BID daily prn as needed   Consider Abilify 2mg  daily.  RTC 6 weeks  25 minutes spent dedicated to the care of this patient on the date of this encounter to include pre-visit review of records, ordering of medication, post visit documentation, and face-to-face time with the patient discussing MDD, [name], [name], insomnia and panic attacks. Discussed continuing current medication regimen.  Patient advised to contact office with any questions, adverse effects, or acute worsening in signs and symptoms.   Discussed potential benefits, risk, and side effects of benzodiazepines to include potential risk of tolerance and dependence, as well as possible drowsiness.  Advised patient not to drive if experiencing drowsiness and to take lowest possible effective dose to minimize risk of dependence and tolerance.   Discussed potential metabolic side effects associated with atypical antipsychotics, as well as potential risk for movement side effects. Advised pt to contact office if movement side effects occur.   There are no diagnoses linked to this encounter.   Please see After Visit Summary for patient specific instructions.  Future Appointments  Date Time Provider Department Center  10/04/2023  3:30 PM Byrl Latin Nattalie, NP CP-CP None  10/05/2023  2:15 PM Awilda Bogus, MD RS[facility]RS None  10/20/2023 12:45 PM [facility] PAT 1 [facility] None  10/25/2023  2:15 PM Wess Hammed, NP [facility]NA[facility] None    No orders of the defined types were placed in this encounter.     -------------------------------

## 2023-10-05 ENCOUNTER — Telehealth: Payer: Self-pay | Admitting: Neurology

## 2023-10-05 ENCOUNTER — Encounter: Payer: Self-pay | Admitting: General Surgery

## 2023-10-05 ENCOUNTER — Ambulatory Visit: Admitting: General Surgery

## 2023-10-05 VITALS — BP 101/67 | HR 74 | Temp 97.8°F | Resp 12 | Ht 63.0 in | Wt 214.0 lb

## 2023-10-05 DIAGNOSIS — L89310 Pressure ulcer of right buttock, unstageable: Secondary | ICD-10-CM

## 2023-10-05 NOTE — Patient Instructions (Signed)
 Continue the treatment with silver nitrate and peroxide.  Try to wear pad/ or underwear and keep the bandage off to help with the skin irritation  You can try Margarie Shay- Guided meditation/ self hypnosis YouTube to help with anxiety/ overthinking

## 2023-10-05 NOTE — Telephone Encounter (Signed)
 Pt r/s appointment due to a conflict with another appointment

## 2023-10-05 NOTE — Progress Notes (Unsigned)
 [facility]  Wound is healing but slow. The tape is causing her skin discomfort. They are unable to pack the area. Irrigating and doing silver nitrate sticks.   She has been a little anxious today and has anxiety at baseline.   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   Continue treatment with silver nitrate and peroxide.    Future Appointments  Date Time Provider Department Center  10/20/2023 12:45 PM [facility] PAT 1 [facility] None  10/25/2023  2:15 PM Wess Hammed, NP [facility] None  11/01/2023  9:45 AM Awilda Bogus, MD RS-RS None  11/15/2023  2:30 PM Mozingo, Ursula Gardner, NP CP-CP None  Deena Farrier, MD [facility] [address] [place] [place], [place] [address] [phone] (office)

## 2023-10-18 ENCOUNTER — Encounter (HOSPITAL_COMMUNITY): Payer: Self-pay

## 2023-10-19 NOTE — Patient Instructions (Addendum)
 [name]  10/19/2023     @PREFPERIOPPHARMACY @   Your procedure is scheduled on  10/25/2023.   Report to [facility] Done at  [phone]  A.M.   Call this number if you have problems the morning of surgery:  [phone]  If you experience any cold or flu symptoms such as cough, fever, chills, shortness of breath, etc. between now and your scheduled surgery, please notify us  at the above number.   Remember:         Your last dose of ozempic should have been on 10/17/2023.         Take 1/2 of your usual insulin  dosage the night before your procedure.       DO NOT take any medications for diabetes the morning of your procedure.       Use your inhaler before you come and bring your rescue inhaler with you.           Follow the diet and prep instructions given to you by the office.    You may drink clear liquids until  0400 am on 10/25/2023.    Clear liquids allowed are:                    Water , Juice (No red color; non-citric and without pulp; diabetics please choose diet or no sugar options), Carbonated beverages (diabetics please choose diet or no sugar options), Clear Tea (No creamer, milk, or cream, including half & half and powdered creamer), Black Coffee Only (No creamer, milk or cream, including half & half and powdered creamer), Plain Jell-O Only (No red color; diabetics please choose no sugar options), Clear Sports drink (No red color; diabetics please choose diet or no sugar options), and Plain Popsicles Only (No red color; diabetics please choose no sugar options)    Take these medicines the morning of surgery with A SIP OF WATER        bupropion , fluoxetine , gabapentin , levothyroxine , lorazepam , oxycodone (if needed), pantoprazole , rizatriptan , zofran  (if needed).     Do not wear jewelry, make-up or nail polish, including gel polish,  artificial nails, or any other type of covering on natural nails (fingers and  toes).  Do not wear lotions, powders, or  perfumes, or deodorant.  Do not shave 48 hours prior to surgery.  Men may shave face and neck.  Do not bring valuables to the hospital.  [facility] is not responsible for any belongings or valuables.  Contacts, dentures or bridgework may not be worn into surgery.  Leave your suitcase in the car.  After surgery it may be brought to your room.  For patients admitted to the hospital, discharge time will be determined by your treatment team.  Patients discharged the day of surgery will not be allowed to drive home and must have someone with them for 24 hours.    Special instructions:   DO NOT smoke tobacco or vape for 24 hours before your procedure.  Please read over the following fact sheets that you were given. Anesthesia Post-op Instructions and Care and Recovery After Surgery      Upper Endoscopy, Adult, Care After After the procedure, it is common to have a sore throat. It is also common to have: Mild stomach pain or discomfort. Bloating. Nausea. Follow these instructions at home: The instructions below may help you care for yourself at home. Your health care provider may give you more instructions. If you have  questions, ask your health care provider. If you were given a sedative during the procedure, it can affect you for several hours. Do not drive or operate machinery until your health care provider says that it is safe. If you will be going home right after the procedure, plan to have a responsible adult: Take you home from the hospital or clinic. You will not be allowed to drive. Care for you for the time you are told. Follow instructions from your health care provider about what you may eat and drink. Return to your normal activities as told by your health care provider. Ask your health care provider what activities are safe for you. Take over-the-counter and prescription medicines only as told by your health care provider. Contact a health care provider if you: Have a sore  throat that lasts longer than one day. Have trouble swallowing. Have a fever. Get help right away if you: Vomit blood or your vomit looks like coffee grounds. Have bloody, black, or tarry stools. Have a very bad sore throat or you cannot swallow. Have difficulty breathing or very bad pain in your chest or abdomen. These symptoms may be an emergency. Get help right away. Call 911. Do not wait to see if the symptoms will go away. Do not drive yourself to the hospital. Summary After the procedure, it is common to have a sore throat, mild stomach discomfort, bloating, and nausea. If you were given a sedative during the procedure, it can affect you for several hours. Do not drive until your health care provider says that it is safe. Follow instructions from your health care provider about what you may eat and drink. Return to your normal activities as told by your health care provider. This information is not intended to replace advice given to you by your health care provider. Make sure you discuss any questions you have with your health care provider. Document Revised: 09/08/2021 Document Reviewed: 09/08/2021 Elsevier Patient Education  2024 [employer]Esophageal Dilatation Esophageal dilatation, or dilation, is done to stretch a blocked or narrowed part of your esophagus. The esophagus is the part of your body that moves food from your mouth to your stomach. You may need to have it stretched if: You have a lot of scar tissue and it makes it hard or painful to swallow. You have cancer of the esophagus. There's a problem with how food moves through your esophagus. In some cases, you may need to have this procedure done more than once. Tell a health care provider about: Any allergies you have. All medicines you're taking, including vitamins, herbs, eye drops, creams, and over-the-counter medicines. Any problems you or family members have had with anesthesia. Any bleeding problems you  have. Any surgeries you've had. Any medical conditions you have. Whether you're pregnant or may be pregnant. What are the risks? Your health care provider will talk with you about risks. These may include: Bleeding. A hole or tear in your esophagus. What happens before the procedure? When to stop eating and drinking Follow instructions from your provider about what you may eat and drink. These may include: 8 hours before your procedure Stop eating most foods. Do not eat meat, fried foods, or fatty foods. Eat only light foods, such as toast or crackers. All liquids are okay except energy drinks and alcohol. 6 hours before your procedure Stop eating. Drink only clear liquids, such as water , clear fruit juice, black coffee, plain tea, and sports drinks. Do not drink energy drinks or  alcohol. 2 hours before your procedure Stop drinking all liquids. You may be allowed to take medicines with small sips of water . If you don't follow your provider's instructions, your procedure may be delayed or canceled. Medicines Ask your provider about: Changing or stopping your regular medicines. These include any diabetes medicines or blood thinners you take. Taking medicines such as aspirin  and ibuprofen. These medicines can thin your blood. Do not take them unless your provider tells you to. Taking over-the-counter medicines, vitamins, herbs, and supplements. General instructions If you'll be going home right after the procedure, plan to have a responsible adult: Take you home from the hospital or clinic. You won't be allowed to drive. Care for you for the time you're told. What happens during the procedure? You may be given: A sedative. This helps you relax. Anesthesia. This keeps you from feeling pain. It will numb certain areas of your body. The stretching may be done with: Simple dilators. These are tools put in your esophagus to stretch it. Guide wires. These wires are put in using a tube  called an endoscope. A dilator is put over the wires to stretch your esophagus. Then the wires are taken out. A balloon. The balloon is on the end of a tube. It's inflated to stretch your esophagus. The procedure may vary among providers and hospitals. What can I expect after the procedure? Your blood pressure, heart rate, breathing rate, and blood oxygen level will be monitored until you leave the hospital or clinic. Your throat may feel sore and numb. This will get better over time. You won't be allowed to eat or drink until your throat is no longer numb. You may be able to go home when you can: Drink. Pee. Sit on the edge of the bed without nausea or dizziness. Follow these instructions at home: Activity If you were given a sedative during the procedure, it can affect you for several hours. Do not drive or operate machinery until your provider says it's safe. Return to your normal activities as told by your provider. Ask your provider what activities are safe for you. General instructions Take over-the-counter and prescription medicines only as told by your provider. Follow instructions from your provider about what you may eat and drink. Do not use any products that contain nicotine or tobacco. These products include cigarettes, chewing tobacco, and vaping devices, such as e-cigarettes. If you need help quitting, ask your provider. Keep all follow-up visits. Your provider will make sure the procedure worked. Where to find more information American Society for Gastrointestinal Endoscopy (ASGE): asge.org Contact a health care provider if: You have trouble swallowing. You have a fever. Your pain doesn't get better with medicine. Get help right away if: You have chest pain. You have trouble breathing. You vomit blood. Your poop is: Black. Tarry. Bloody. These symptoms may be an emergency. Get help right away. Call 911. Do not wait to see if the symptoms will go away. Do not drive  yourself to the hospital. This information is not intended to replace advice given to you by your health care provider. Make sure you discuss any questions you have with your health care provider. Document Revised: 08/26/2022 Document Reviewed: 08/26/2022 Elsevier Patient Education  2024 Else[employer] Inc.Colonoscopy, Adult, Care After The following information offers guidance on how to care for yourself after your procedure. Your health care provider may also give you more specific instructions. If you have problems or questions, contact your health care provider. What can I expect  after the procedure? After the procedure, it is common to have: A small amount of blood in your stool for 24 hours after the procedure. Some gas. Mild cramping or bloating of your abdomen. Follow these instructions at home: Eating and drinking  Drink enough fluid to keep your urine pale yellow. Follow instructions from your health care provider about eating or drinking restrictions. Resume your normal diet as told by your health care provider. Avoid heavy or fried foods that are hard to digest. Activity Rest as told by your health care provider. Avoid sitting for a long time without moving. Get up to take short walks every 1-2 hours. This is important to improve blood flow and breathing. Ask for help if you feel weak or unsteady. Return to your normal activities as told by your health care provider. Ask your health care provider what activities are safe for you. Managing cramping and bloating  Try walking around when you have cramps or feel bloated. If directed, apply heat to your abdomen as told by your health care provider. Use the heat source that your health care provider recommends, such as a moist heat pack or a heating pad. Place a towel between your skin and the heat source. Leave the heat on for 20-30 minutes. Remove the heat if your skin turns bright red. This is especially important if you are unable to  feel pain, heat, or cold. You have a greater risk of getting burned. General instructions If you were given a sedative during the procedure, it can affect you for several hours. Do not drive or operate machinery until your health care provider says that it is safe. For the first 24 hours after the procedure: Do not sign important documents. Do not drink alcohol. Do your regular daily activities at a slower pace than normal. Eat soft foods that are easy to digest. Take over-the-counter and prescription medicines only as told by your health care provider. Keep all follow-up visits. This is important. Contact a health care provider if: You have blood in your stool 2-3 days after the procedure. Get help right away if: You have more than a small spotting of blood in your stool. You have large blood clots in your stool. You have swelling of your abdomen. You have nausea or vomiting. You have a fever. You have increasing pain in your abdomen that is not relieved with medicine. These symptoms may be an emergency. Get help right away. Call 911. Do not wait to see if the symptoms will go away. Do not drive yourself to the hospital. Summary After the procedure, it is common to have a small amount of blood in your stool. You may also have mild cramping and bloating of your abdomen. If you were given a sedative during the procedure, it can affect you for several hours. Do not drive or operate machinery until your health care provider says that it is safe. Get help right away if you have a lot of blood in your stool, nausea or vomiting, a fever, or increased pain in your abdomen. This information is not intended to replace advice given to you by your health care provider. Make sure you discuss any questions you have with your health care provider. Document Revised: 07/12/2022 Document Reviewed: 01/20/2021 Elsevier Patient Education  2024 Else[employer] Inc.General Anesthesia, Adult, Care After The  following information offers guidance on how to care for yourself after your procedure. Your health care provider may also give you more specific instructions. If you have  problems or questions, contact your health care provider. What can I expect after the procedure? After the procedure, it is common for people to: Have pain or discomfort at the IV site. Have nausea or vomiting. Have a sore throat or hoarseness. Have trouble concentrating. Feel cold or chills. Feel weak, sleepy, or tired (fatigue). Have soreness and body aches. These can affect parts of the body that were not involved in surgery. Follow these instructions at home: For the time period you were told by your health care provider:  Rest. Do not participate in activities where you could fall or become injured. Do not drive or use machinery. Do not drink alcohol. Do not take sleeping pills or medicines that cause drowsiness. Do not make important decisions or sign legal documents. Do not take care of children on your own. General instructions Drink enough fluid to keep your urine pale yellow. If you have sleep apnea, surgery and certain medicines can increase your risk for breathing problems. Follow instructions from your health care provider about wearing your sleep device: Anytime you are sleeping, including during daytime naps. While taking prescription pain medicines, sleeping medicines, or medicines that make you drowsy. Return to your normal activities as told by your health care provider. Ask your health care provider what activities are safe for you. Take over-the-counter and prescription medicines only as told by your health care provider. Do not use any products that contain nicotine or tobacco. These products include cigarettes, chewing tobacco, and vaping devices, such as e-cigarettes. These can delay incision healing after surgery. If you need help quitting, ask your health care provider. Contact a health care  provider if: You have nausea or vomiting that does not get better with medicine. You vomit every time you eat or drink. You have pain that does not get better with medicine. You cannot urinate or have bloody urine. You develop a skin rash. You have a fever. Get help right away if: You have trouble breathing. You have chest pain. You vomit blood. These symptoms may be an emergency. Get help right away. Call 911. Do not wait to see if the symptoms will go away. Do not drive yourself to the hospital. Summary After the procedure, it is common to have a sore throat, hoarseness, nausea, vomiting, or to feel weak, sleepy, or fatigue. For the time period you were told by your health care provider, do not drive or use machinery. Get help right away if you have difficulty breathing, have chest pain, or vomit blood. These symptoms may be an emergency. This information is not intended to replace advice given to you by your health care provider. Make sure you discuss any questions you have with your health care provider. Document Revised: 08/27/2021 Document Reviewed: 08/27/2021 Elsevier Patient Education  2024 [employer].

## 2023-10-20 ENCOUNTER — Encounter (HOSPITAL_COMMUNITY)
Admission: RE | Admit: 2023-10-20 | Discharge: 2023-10-20 | Disposition: A | Discharge status: Home / Self Care | Source: Ambulatory Visit | Attending: Internal Medicine | Admitting: Internal Medicine

## 2023-10-20 VITALS — BP 107/72 | HR 74 | Temp 97.8°F | Resp 18 | Ht 63.0 in | Wt 214.0 lb

## 2023-10-20 DIAGNOSIS — E119 Type 2 diabetes mellitus without complications: Secondary | ICD-10-CM | POA: Insufficient documentation

## 2023-10-20 DIAGNOSIS — Z01812 Encounter for preprocedural laboratory examination: Secondary | ICD-10-CM | POA: Diagnosis present

## 2023-10-20 DIAGNOSIS — D509 Iron deficiency anemia, unspecified: Secondary | ICD-10-CM | POA: Diagnosis not present

## 2023-10-20 LAB — CBC WITH DIFFERENTIAL/PLATELET
Abs Immature Granulocytes: 0.03 10*3/uL (ref 0.00–0.07)
Basophils Absolute: 0 10*3/uL (ref 0.0–0.1)
Basophils Relative: 0 %
Eosinophils Absolute: 0.2 10*3/uL (ref 0.0–0.5)
Eosinophils Relative: 2 %
HCT: 42 % (ref 36.0–46.0)
Hemoglobin: 13.3 g/dL (ref 12.0–15.0)
Immature Granulocytes: 0 %
Lymphocytes Relative: 16 %
Lymphs Abs: 1.5 10*3/uL (ref 0.7–4.0)
MCH: 29.2 pg (ref 26.0–34.0)
MCHC: 31.7 g/dL (ref 30.0–36.0)
MCV: 92.1 fL (ref 80.0–100.0)
Monocytes Absolute: 0.6 10*3/uL (ref 0.1–1.0)
Monocytes Relative: 6 %
Neutro Abs: 7.1 10*3/uL (ref 1.7–7.7)
Neutrophils Relative %: 76 %
Platelets: 191 10*3/uL (ref 150–400)
RBC: 4.56 MIL/uL (ref 3.87–5.11)
RDW: 13.4 % (ref 11.5–15.5)
WBC: 9.4 10*3/uL (ref 4.0–10.5)
nRBC: 0 % (ref 0.0–0.2)

## 2023-10-20 LAB — BASIC METABOLIC PANEL WITH GFR
Anion gap: 10 (ref 5–15)
BUN: 12 mg/dL (ref 8–23)
CO2: 29 mmol/L (ref 22–32)
Calcium: 9.4 mg/dL (ref 8.9–10.3)
Chloride: 98 mmol/L (ref 98–111)
Creatinine, Ser: 0.77 mg/dL (ref 0.44–1.00)
GFR, Estimated: >60 mL/min (ref >60)
Glucose, Bld: 179 mg/dL — ABNORMAL HIGH (ref 70–99)
Potassium: 4 mmol/L (ref 3.5–5.1)
Sodium: 137 mmol/L (ref 135–145)

## 2023-10-24 ENCOUNTER — Telehealth: Payer: Self-pay | Admitting: *Deleted

## 2023-10-24 NOTE — Telephone Encounter (Signed)
 LMOVM to return call regarding procedure tomorrow.

## 2023-10-24 NOTE — Telephone Encounter (Signed)
 Called pt and was advised they are wanting to cancel procedure all together and will call back another time to reschedule

## 2023-10-24 NOTE — Progress Notes (Signed)
 Called patients home/cell # to inform her the procedure for 10/25/23 is being canceled with Dr. Riley Cheadle.  No answer, left voicemail and included the phone number for pre-op if she has questions.

## 2023-10-24 NOTE — Telephone Encounter (Signed)
 LMOVM to call back, can be done in rm 1-2 per Dr. Riley Cheadle

## 2023-10-25 ENCOUNTER — Encounter (HOSPITAL_COMMUNITY): Admission: RE | Payer: Self-pay | Source: Home / Self Care

## 2023-10-25 ENCOUNTER — Ambulatory Visit: Payer: Medicare HMO | Admitting: Neurology

## 2023-10-25 ENCOUNTER — Ambulatory Visit (HOSPITAL_COMMUNITY): Admission: RE | Admit: 2023-10-25 | Source: Home / Self Care | Admitting: Internal Medicine

## 2023-10-25 SURGERY — COLONOSCOPY
Anesthesia: Choice

## 2023-11-01 ENCOUNTER — Ambulatory Visit: Admitting: General Surgery

## 2023-11-01 ENCOUNTER — Encounter: Payer: Self-pay | Admitting: General Surgery

## 2023-11-01 VITALS — BP 98/66 | HR 58 | Temp 98.0°F | Resp 14 | Ht 63.0 in | Wt 215.0 lb

## 2023-11-01 DIAGNOSIS — B3789 Other sites of candidiasis: Secondary | ICD-10-CM | POA: Diagnosis not present

## 2023-11-01 DIAGNOSIS — L89310 Pressure ulcer of right buttock, unstageable: Secondary | ICD-10-CM

## 2023-11-01 DIAGNOSIS — B3731 Acute candidiasis of vulva and vagina: Secondary | ICD-10-CM | POA: Diagnosis not present

## 2023-11-01 MED ORDER — FLUCONAZOLE 150 MG PO TABS: 150.0000 mg | ORAL_TABLET | Freq: Every day | ORAL | 0 refills | Status: DC

## 2023-11-01 NOTE — Patient Instructions (Signed)
 Zinc oxide to the area for barrier. Can also get some over the counter monistat for cream on the external skin area. Take the diflucan as prescribed.   Continue to do a silver nitrate to the 1 cm opening daily.  Keep area otherwise dry and clean.

## 2023-11-01 NOTE — Progress Notes (Signed)
 [facility]  Patient with recent admission to [facility] for pneumonia and sepsis. Received antibiotics in the hospital. Had catheter and diaper. Reports having severe pain in the perianal, vulvavaginal region.  She is worried about her wound.   BP 98/66   Pulse (!) 58   Temp 98 F (36.7 C) (Oral)   Resp 14   Ht 5\' 3"  (1.6 m)   Wt 215 lb (97.5 kg)   SpO2 96%   BMI 38.09 kg/m  Severe perianal and vulvavaginal candidasis Right buttock wound healing, some candida around the skin, probed and 1cm deep, no drainage     Patient with healing wound on her right buttock. Looking ok but has pretty severe candida infection.   Zinc oxide to the area for barrier. Can also get some over the counter monistat for cream on the external skin area. Take the diflucan as prescribed.   Continue to do a silver nitrate to the 1 cm opening daily.  Keep area otherwise dry and clean.   Future Appointments  Date Time Provider Department Center  11/07/2023  9:00 AM Awilda Bogus, MD [facility] None  11/15/2023  2:30 PM Mozingo, Ursula Gardner, NP CP[facility]CP None  02/27/2024  7:45 AM Wess Hammed, NP [facility] None    Deena Farrier, MD [facility] [address] [place], [place] [address] [phone] (office)

## 2023-11-07 ENCOUNTER — Other Ambulatory Visit: Payer: Self-pay

## 2023-11-07 ENCOUNTER — Ambulatory Visit: Admitting: General Surgery

## 2023-11-07 ENCOUNTER — Encounter: Payer: Self-pay | Admitting: General Surgery

## 2023-11-07 VITALS — BP 118/78 | HR 79 | Temp 98.8°F | Resp 20 | Ht 63.0 in | Wt 212.0 lb

## 2023-11-07 DIAGNOSIS — L89310 Pressure ulcer of right buttock, unstageable: Secondary | ICD-10-CM

## 2023-11-07 DIAGNOSIS — B3731 Acute candidiasis of vulva and vagina: Secondary | ICD-10-CM | POA: Diagnosis not present

## 2023-11-07 DIAGNOSIS — B3789 Other sites of candidiasis: Secondary | ICD-10-CM | POA: Diagnosis not present

## 2023-11-07 MED ORDER — NYSTATIN 100000 UNIT/GM EX POWD: 1.0000 | Freq: Three times a day (TID) | CUTANEOUS | 0 refills | Status: DC

## 2023-11-07 MED ORDER — FLUCONAZOLE 150 MG PO TABS: 150.0000 mg | ORAL_TABLET | Freq: Every day | ORAL | 0 refills | Status: AC

## 2023-11-07 NOTE — Patient Instructions (Addendum)
 Hold your Cholesterol Medication/ Lovastatin.  Continue zinc on the area that is raw. Silver nitrate to the pressure wound daily to every other day as needed to keep wound from closing prematurely.

## 2023-11-07 NOTE — Progress Notes (Signed)
 [facility]  Yeast much improved around the perianal region but still very raw. Doing some salva to the area. Has not tolerated zinc. Husband in room.   BP 118/78   Pulse 79   Temp 98.8 F (37.1 C) (Oral)   Resp 20   Ht 5\' 3"  (1.6 m)   Wt 212 lb (96.2 kg)   SpO2 95%   BMI 37.55 kg/m  Perianal yeast improving, vulvovaginal yeast satellites remain  Right buttock pressure sore almost healed, < 2mm opening   Patient with healing buttock pressure sore and new perianal /vaginal yeast infection that is still not fully cleared up. She is also raw in the perianal region.    Hold your Cholesterol Medication/ Lovastatin.  Continue zinc on the area that is raw. Silver nitrate to the pressure wound daily to every other day as needed to keep wound from closing prematurely.   Future Appointments  Date Time Provider Department Center  11/15/2023  2:30 PM Mozingo, Ursula Gardner, NP CP[facility]CP None  11/22/2023  9:30 AM Awilda Bogus, MD [facility] None  02/27/2024  7:45 AM Wess Hammed, NP [facility] None   Deena Farrier, MD [facility] [address] [place], [place] [address] [phone] (office)

## 2023-11-09 ENCOUNTER — Ambulatory Visit: Admitting: General Surgery

## 2023-11-15 ENCOUNTER — Telehealth: Admitting: Adult Health

## 2023-11-15 ENCOUNTER — Encounter: Payer: Self-pay | Admitting: Adult Health

## 2023-11-15 DIAGNOSIS — F339 Major depressive disorder, recurrent, unspecified: Secondary | ICD-10-CM | POA: Diagnosis not present

## 2023-11-15 DIAGNOSIS — F331 Major depressive disorder, recurrent, moderate: Secondary | ICD-10-CM

## 2023-11-15 DIAGNOSIS — F410 Panic disorder [episodic paroxysmal anxiety] without agoraphobia: Secondary | ICD-10-CM

## 2023-11-15 DIAGNOSIS — F411 Generalized anxiety disorder: Secondary | ICD-10-CM | POA: Diagnosis not present

## 2023-11-15 DIAGNOSIS — G4700 Insomnia, unspecified: Secondary | ICD-10-CM

## 2023-11-15 DIAGNOSIS — F4310 Post-traumatic stress disorder, unspecified: Secondary | ICD-10-CM

## 2023-11-15 NOTE — Progress Notes (Signed)
 [name] [id number] 06/01/1956 68 y.o.  Virtual Visit via Video Note  I connected with pt @ on 11/15/23 at  2:30 PM EDT by a video enabled telemedicine application and verified that I am speaking with the correct person using two identifiers.   I discussed the limitations of evaluation and management by telemedicine and the availability of in person appointments. The patient expressed understanding and agreed to proceed.  I discussed the assessment and treatment plan with the patient. The patient was provided an opportunity to ask questions and all were answered. The patient agreed with the plan and demonstrated an understanding of the instructions.   The patient was advised to call back or seek an in-person evaluation if the symptoms worsen or if the condition fails to improve as anticipated.  I provided 25 minutes of non-face-to-face time during this encounter.  The patient was located at home.  The provider was located at [facility].   Melanie Camera, NP   Subjective:   Patient ID:  [name] is a 68 y.o. (DOB 1956-05-07) female.  Chief Complaint: No chief complaint on file.   [name], [name], [name], insomnia and panic attacks.  Reports hospitalization on 10/24/2023. Collateral reviewed. Started on Seroquel for delirium, now resolved.  Describes mood today as "so-so". Pleasant. Reports decreased tearfulness. Mood symptoms - reports depression - "a little". Reports lower interest and motivation. Reports anxiety and irritability. Denies panic attacks - "I feel like they are coming". Reports  worry, rumination and over thinking. Reports mild hand tremors. Reports mood is variable. Stating " I feel like I'm doing a little better". Taking medications as prescribed. Energy levels improved. Active, does not have a regular exercise routine with physical disabilities. Enjoys some usual interests and activities - reports doing  better in social situations. Divorced from husband of 40 years, but lives with ex-husband and his mother in law - 61 years old. Has a dog yorkie - "[name]". Has 3 grown children. Spending time with family. Appetite adequate. Weight stable - 200 pounds - 63"- taking Monjauro. Sleeps better some nights than others. Averages 6 to 7 hours. Reports focus and concentration improved. Completing tasks. Managing minimal aspects of household. 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  Denies AH or VH. Denies self harm. Denies substance abuse. Denies paranoia.   Chronic pain - working with pain management.  Seeing Dr. Tresia Fruit - managing migraines  Previous medication trials: Cymbalta, Prozac , Rexulti , Vraylar ,    Review of Systems:  Review of Systems  Musculoskeletal:  Negative for gait problem.  Neurological:  Negative for tremors.  Psychiatric/Behavioral:         Please refer to HPI    Medications: I have reviewed the patient's current medications.  Current Outpatient Medications  Medication Sig Dispense Refill   albuterol  (VENTOLIN  HFA) 108 (90 Base) MCG/ACT inhaler Inhale into the lungs.     Ascorbic Acid (VITAMIN C) 1000 MG tablet Take 1,000 mg by mouth daily.     buPROPion  (WELLBUTRIN  SR) 200 MG 12 hr tablet Take 1 tablet (200 mg total) by mouth 2 (two) times daily. 60 tablet 5   busPIRone  (BUSPAR ) 10 MG tablet Take 1.5 tablets (15 mg total) by mouth 2 (two) times daily. 60 tablet 5   Cholecalciferol (VITAMIN D) 125 MCG (5000 UT) CAPS Take 2 capsules by mouth daily.     COLLAGEN PO Take 6,000 mg by  mouth daily.     cyanocobalamin 2000 MCG tablet Take 2,000 mcg by mouth daily.     estradiol (ESTRACE) 0.5 MG tablet Take 0.5 mg by mouth daily.     ferrous gluconate  (FERGON) 324 MG tablet Take 324 mg by mouth daily with breakfast.     fluconazole  (DIFLUCAN ) 150 MG tablet Take 1 tablet (150 mg total) by mouth daily for 10 days. Hold your Lovastatin while taking  this medication. 10 tablet 0   FLUoxetine  (PROZAC ) 40 MG capsule Take 1 capsule (40 mg total) by mouth daily. 90 capsule 1   furosemide  (LASIX ) 20 MG tablet Take 20 mg by mouth every Monday, Wednesday, and Friday.     gabapentin  (NEURONTIN ) 600 MG tablet Take 600 mg by mouth in the morning, at noon, in the evening, and at bedtime.     Galcanezumab -gnlm (EMGALITY ) 120 MG/ML SOAJ Inject 120 mg into the skin every 30 (thirty) days. 1.12 mL 11   insulin  aspart (NOVOLOG ) 100 UNIT/ML injection Inject 5-8 Units into the skin 3 (three) times daily before meals.     lamoTRIgine  (LAMICTAL ) 200 MG tablet Take one tablet at bedtime. 30 tablet 5   LANTUS  SOLOSTAR 100 UNIT/ML Solostar Pen Inject 46 Units into the skin daily. At hs     levocetirizine (XYZAL ) 5 MG tablet Take 5 mg by mouth every evening.     levothyroxine  (SYNTHROID ) 200 MCG tablet Take 175 mcg by mouth daily before breakfast.     LORazepam  (ATIVAN ) 0.5 MG tablet Take one to 2 tablets daily as needed for panic attacks. 60 tablet 2   LOVASTATIN PO Take 80 mg by mouth at bedtime.     meclizine  (ANTIVERT ) 25 MG tablet Take 25 mg by mouth 3 (three) times daily as needed for dizziness.     montelukast  (SINGULAIR ) 10 MG tablet Take 10 mg by mouth daily.     nystatin  (MYCOSTATIN /NYSTOP ) powder Apply 1 Application topically 3 (three) times daily. 15 g 0   ondansetron  (ZOFRAN ) 4 MG tablet Take 1 tablet (4 mg total) by mouth every 8 (eight) hours as needed for refractory nausea / vomiting. 20 tablet 1   oxybutynin  (DITROPAN ) 5 MG tablet Take 5 mg by mouth every 8 (eight) hours as needed.     Oxycodone  HCl 20 MG TABS Take 15 mg by mouth.     pantoprazole  (PROTONIX ) 40 MG tablet Take 1 tablet (40 mg total) by mouth 2 (two) times daily before a meal. 60 tablet 3   rizatriptan  (MAXALT ) 10 MG tablet Take 1 tablet (10 mg total) by mouth every 6 (six) hours as needed for migraine. May repeat in 2 hours if needed 10 tablet 11   SYMBICORT 80-4.5 MCG/ACT inhaler  Inhale into the lungs.     tirzepatide (MOUNJARO) 2.5 MG/0.5ML Pen Inject 2.5 mg into the skin once a week. Tuesdays     traZODone  (DESYREL ) 100 MG tablet Take 1 tablet (100 mg total) by mouth at bedtime. 90 tablet 0   zinc gluconate 50 MG tablet Take 50 mg by mouth daily.     No current facility-administered medications for this visit.    Medication Side Effects: None  Allergies:  Allergies  Allergen Reactions   Hydromorphone Other (See Comments) and Shortness Of Breath    Respiratory arrest per patient  Other reaction(s): coded with fast admin  Note: Imported from external source.   Penicillins Anaphylaxis and Other (See Comments)   Erythromycin Base Nausea And Vomiting   Ibuprofen Other (  See Comments)    Per pt, it makes her bleed   Liraglutide    Meperidine  Hives   Meperidine  Hcl Other (See Comments)   Nsaids Other (See Comments)    GI bleed     Past Medical History:  Diagnosis Date   Anxiety    Asthma    Chronic pain    Depression    Diabetes mellitus without complication (HCC)    Fibromyalgia    GERD (gastroesophageal reflux disease)    Headache    migraines   Hyperlipidemia    Hypothyroidism    Osteoarthritis    PONV (postoperative nausea and vomiting)     No family history on file.  Social History   Socioeconomic History   Marital status: Divorced    Spouse name: Not on file   Number of children: Not on file   Years of education: Not on file   Highest education level: Not on file  Occupational History   Not on file  Tobacco Use   Smoking status: Former    Types: Cigarettes    Passive exposure: Never   Smokeless tobacco: Never  Vaping Use   Vaping status: Never Used  Substance and Sexual Activity   Alcohol use: Never   Drug use: Never   Sexual activity: Not Currently    Birth control/protection: None  Other Topics Concern   Not on file  Social History Narrative   Caffiene 1-2 cups daily.   Disabilty from accident, RN   Lives husband    3 kids,   8 grandkids.    Social Drivers of Health   Financial Resource Strain: Patient Unable To Answer (10/24/2023)   Received from [facility]   Overall Financial Resource Strain (CARDIA)    Difficulty of Paying Living Expenses: Patient unable to answer  Food Insecurity: Patient Unable To Answer (10/24/2023)   Received from [facility]   Hunger Vital Sign    Worried About Running Out of Food in the Last Year: Patient unable to answer    Ran Out of Food in the Last Year: Patient unable to answer  Transportation Needs: Patient Unable To Answer (10/24/2023)   Received from [facility] - Transportation    Lack of Transportation (Medical): Patient unable to answer    Lack of Transportation (Non-Medical): Patient unable to answer  Physical Activity: Patient Unable To Answer (10/24/2023)   Received from [facility]   Exercise Vital Sign    Days of Exercise per Week: Patient unable to answer    Minutes of Exercise per Session: Patient unable to answer  Stress: Patient Unable To Answer (10/24/2023)   Received from [facility] - Occupational Stress Questionnaire    Feeling of Stress : Patient unable to answer  Social Connections: Patient Unable To Answer (10/24/2023)   Received from [facility]   Social Connection and Isolation Panel [NHANES]    Frequency of Communication with Friends and Family: Patient unable to answer    Frequency of Social Gatherings with Friends and Family: Patient unable to answer    Attends Religious Services: Patient unable to answer    Active Member of Clubs or Organizations: Patient unable to answer    Attends Banker Meetings: Patient unable to answer    Marital Status: Patient unable to answer  Intimate Partner Violence: Patient Unable To Answer (10/24/2023)   Received from [facility]   Humiliation,  Afraid, Rape, and Kick questionnaire    Fear of Current or  Ex-Partner: Patient unable to answer    Emotionally Abused: Patient unable to answer    Physically Abused: Patient unable to answer    Sexually Abused: Patient unable to answer    Past Medical History, Surgical history, Social history, and Family history were reviewed and updated as appropriate.   Please see review of systems for further details on the patient's review from today.   Objective:   Physical Exam:  There were no vitals taken for this visit.  Physical Exam Constitutional:      General: She is not in acute distress. Musculoskeletal:        General: No deformity.  Neurological:     Mental Status: She is alert and oriented to person, place, and time.     Coordination: Coordination normal.  Psychiatric:        Attention and Perception: Attention and perception normal. She does not perceive auditory or visual hallucinations.        Mood and Affect: Mood normal. Mood is not anxious or depressed. Affect is not labile, blunt, angry or inappropriate.        Speech: Speech normal.        Behavior: Behavior normal.        Thought Content: Thought content normal. Thought content is not paranoid or delusional. Thought content does not include homicidal or suicidal ideation. Thought content does not include homicidal or suicidal plan.        Cognition and Memory: Cognition and memory normal.        Judgment: Judgment normal.     Comments: Insight intact     Lab Review:     Component Value Date/Time   NA 137 10/20/2023 1257   K 4.0 10/20/2023 1257   CL 98 10/20/2023 1257   CO2 29 10/20/2023 1257   GLUCOSE 179 (H) 10/20/2023 1257   BUN 12 10/20/2023 1257   CREATININE 0.77 10/20/2023 1257   CALCIUM  9.4 10/20/2023 1257   PROT 5.8 (L) 12/23/2021 0527   ALBUMIN 2.8 (L) 12/23/2021 0527   AST 16 12/23/2021 0527   ALT 15 12/23/2021 0527   ALKPHOS 54 12/23/2021 0527   BILITOT 0.6 12/23/2021 0527   GFRNONAA >60 10/20/2023 1257       Component Value Date/Time   WBC 9.4  10/20/2023 1257   RBC 4.56 10/20/2023 1257   HGB 13.3 10/20/2023 1257   HCT 42.0 10/20/2023 1257   PLT 191 10/20/2023 1257   MCV 92.1 10/20/2023 1257   MCH 29.2 10/20/2023 1257   MCHC 31.7 10/20/2023 1257   RDW 13.4 10/20/2023 1257   LYMPHSABS 1.5 10/20/2023 1257   MONOABS 0.6 10/20/2023 1257   EOSABS 0.2 10/20/2023 1257   BASOSABS 0.0 10/20/2023 1257    No results found for: "POCLITH", "LITHIUM"   No results found for: "PHENYTOIN", "PHENOBARB", "VALPROATE", "CBMZ"   .res Assessment: Plan:    Plan:  PDMP reviewed  Seroquel 25mg  at - take 2 tablets at bedtime - started while hospitalized.  Wellbutrin  SR 200mg  BID - PCP - increased since last visit.  Buspar  15mg  BID - PCP   Lamictal  200mg  daily Prozac  40mg  daily  Trazadone 150mg  at bedtime Ativan  0.5mg  BID daily prn as needed   Consider Abilify 2mg  daily.  RTC 6 weeks  25 minutes spent dedicated to the care of this patient on the date of this encounter to include pre-visit review of records, ordering of medication, post  visit documentation, and face-to-face time with the patient discussing MDD, [name], [name], insomnia and panic attacks. Discussed continuing current medication regimen.  Patient advised to contact office with any questions, adverse effects, or acute worsening in signs and symptoms.   Discussed potential benefits, risk, and side effects of benzodiazepines to include potential risk of tolerance and dependence, as well as possible drowsiness. Advised patient not to drive if experiencing drowsiness and to take lowest possible effective dose to minimize risk of dependence and tolerance.   Discussed potential metabolic side effects associated with atypical antipsychotics, as well as potential risk for movement side effects. Advised pt to contact office if movement side effects occur.    There are no diagnoses linked to this encounter.   Please see After Visit Summary for patient specific instructions.  Future  Appointments  Date Time Provider Department Center  11/22/2023  9:45 AM Awilda Bogus, MD [facility] None  02/27/2024  7:45 AM Wess Hammed, NP [facility] None    No orders of the defined types were placed in this encounter.     -------------------------------

## 2023-11-22 ENCOUNTER — Encounter: Payer: Self-pay | Admitting: General Surgery

## 2023-11-22 ENCOUNTER — Ambulatory Visit: Admitting: General Surgery

## 2023-11-22 VITALS — BP 98/65 | HR 75 | Temp 97.8°F | Resp 14 | Ht 63.0 in | Wt 213.0 lb

## 2023-11-22 DIAGNOSIS — L89310 Pressure ulcer of right buttock, unstageable: Secondary | ICD-10-CM | POA: Diagnosis not present

## 2023-11-22 NOTE — Patient Instructions (Signed)
 Keep area dry. You can do silver nitrate to opening as needed. Do nystatin  powder to any residual yeast.

## 2023-11-22 NOTE — Progress Notes (Signed)
 [facility]  Doing well. Yeast is pretty much resolved. No major areas noted. Healing the buttock wound.  BP 98/65   Pulse 75   Temp 97.8 F (36.6 C) (Oral)   Resp 14   Ht 5' 3 (1.6 m)   Wt 213 lb (96.6 kg)   SpO2 92%   BMI 37.73 kg/m  Area on the right buttock with small ,1mm opening probed and does not track deeper, area less indurated   Patient with healing buttock ulcer that has taken a long time to heal.   Keep area dry. You can do silver nitrate to opening as needed. Do nystatin  powder to any residual yeast.  Deena Farrier, MD [facility] [address] [place], [place] [address] [phone] (office)

## 2023-12-18 IMAGING — CR DG LUMBAR SPINE 2-3V
3 series · 3 of 3 positions shown · non-contrast
Comparison: X-ray 09/30/2009.

CLINICAL DATA: Lumbar strain.   Right low back and leg pain.

EXAM:
LUMBAR SPINE - 2-3 VIEW

[w lumbar spine ap]
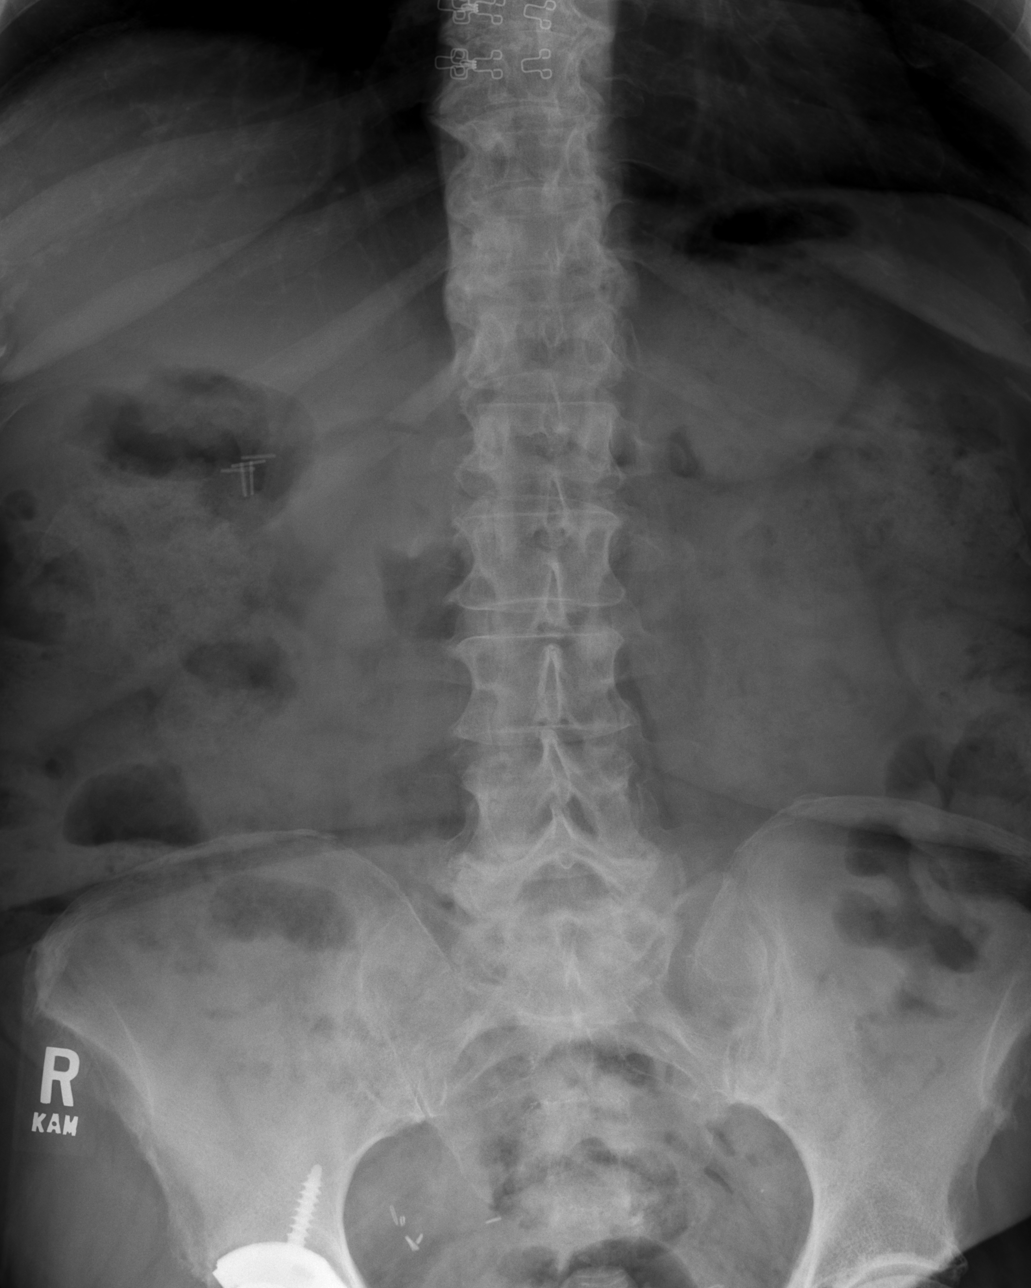

[w lumbar spine lat]
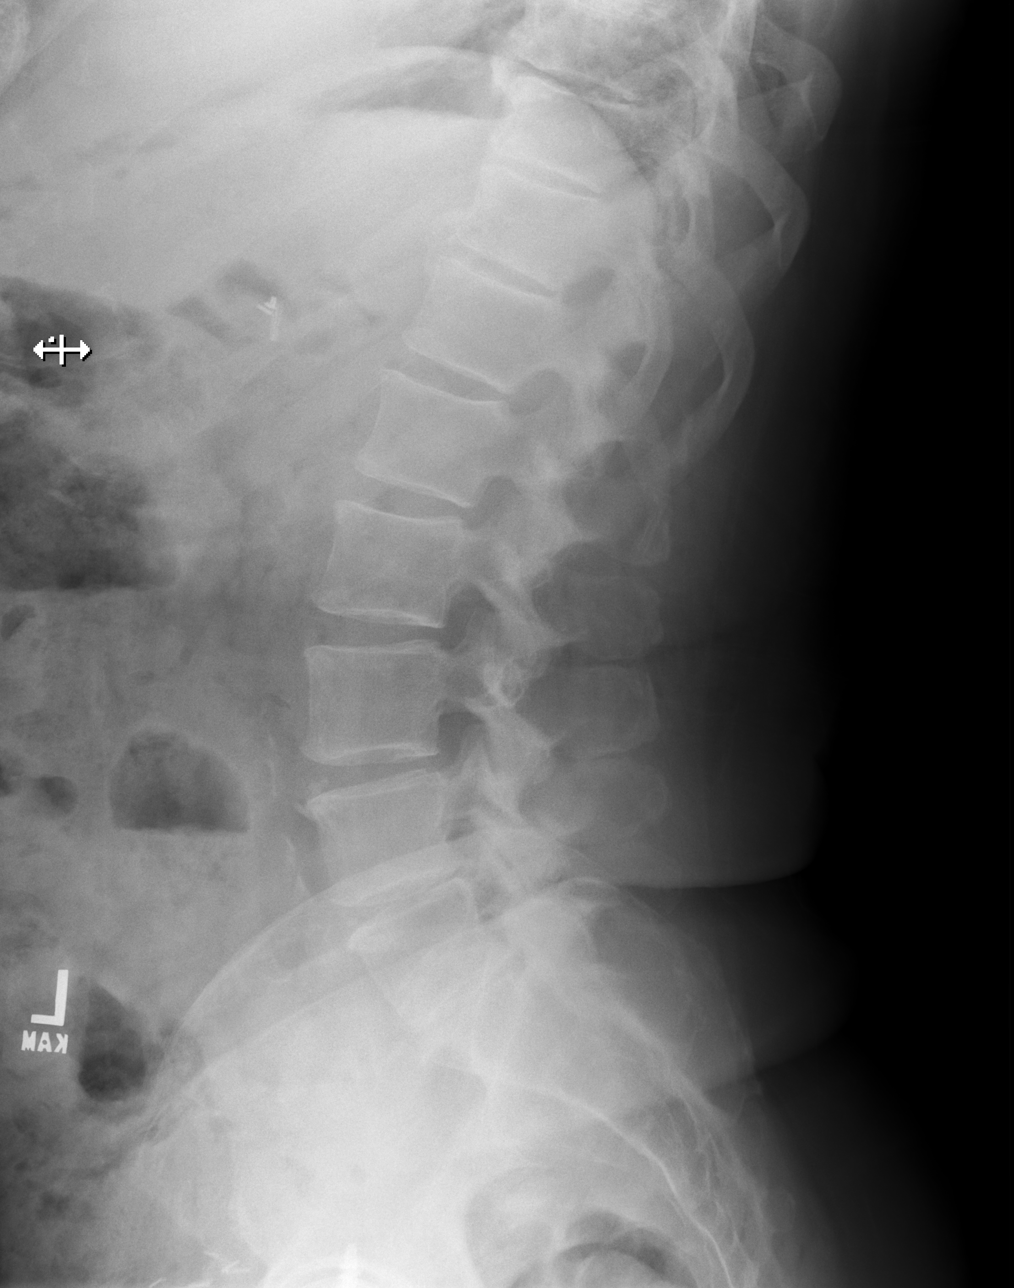

[w lumbar l-5 s-1 spot]
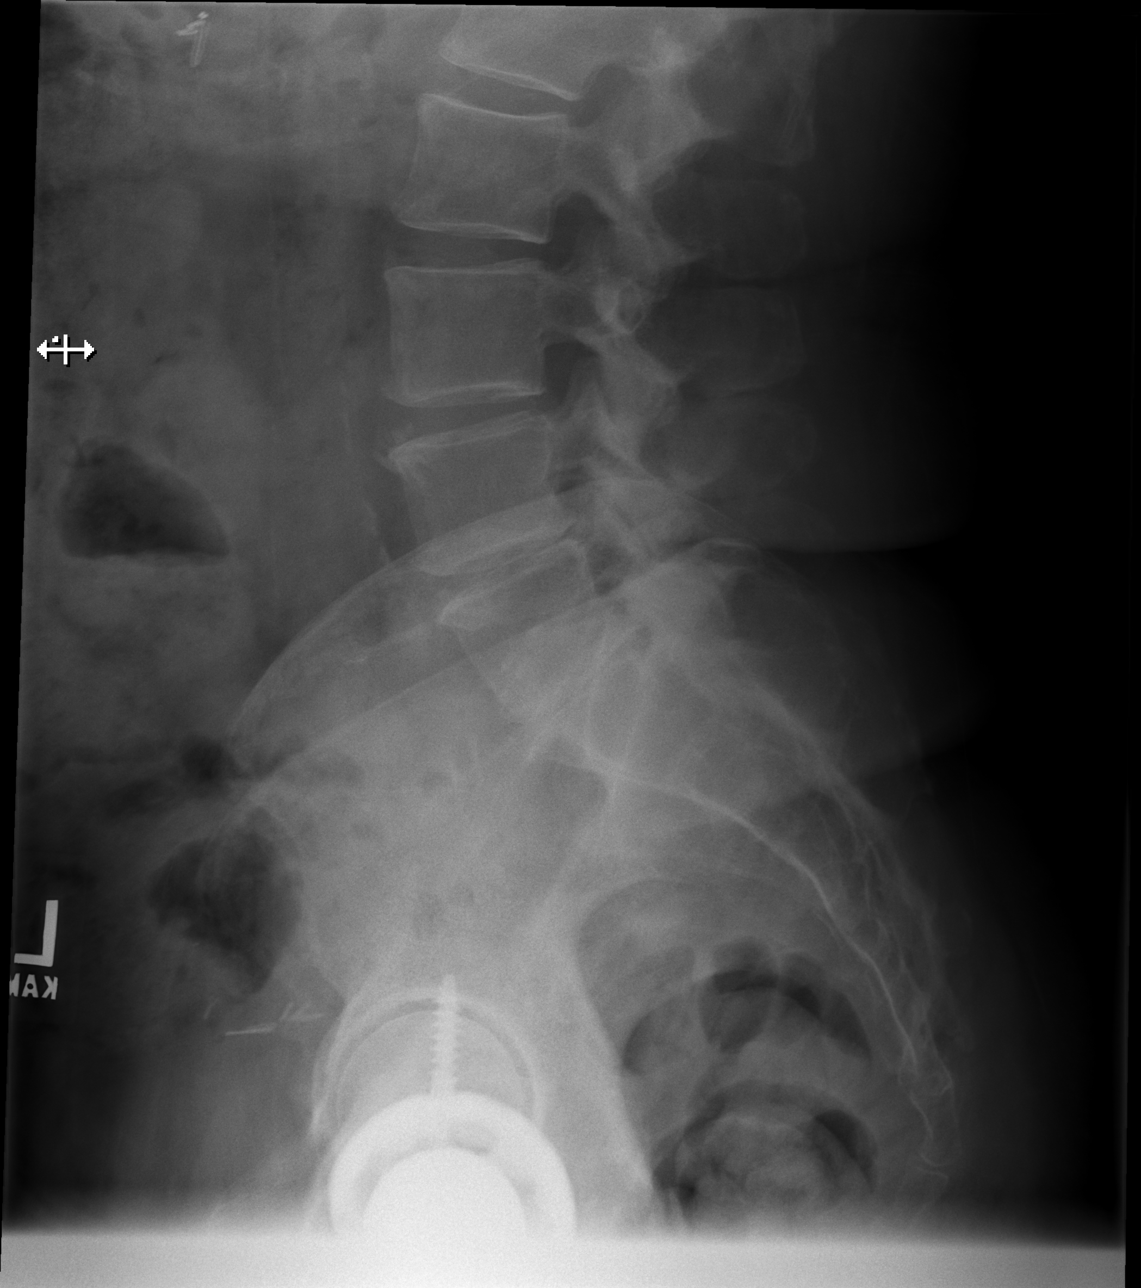

[3 of 3 positions shown; findings below may reference images not displayed]

FINDINGS: Alignment is within normal limits. Vertebral body heights are
maintained. Mild disc height loss and minimal endplate degenerative
changes at L4-L5 and L5-S1. Mild to moderate facet degenerative
changes present at L4-L5 and L5-S1. Atherosclerotic calcifications
noted within the visualized arterial segments. Right upper quadrant
surgical clips likely due to prior cholecystectomy. Right hip
prosthesis partially visualized.
IMPRESSION: Mild-to-moderate degenerative changes of the lower lumbar spine.

## 2023-12-24 ENCOUNTER — Emergency Department (HOSPITAL_COMMUNITY)

## 2023-12-24 ENCOUNTER — Inpatient Hospital Stay (HOSPITAL_COMMUNITY)
Admission: EM | Admit: 2023-12-24 | Discharge: 2023-12-26 | DRG: 313 | Disposition: A | Discharge status: Home / Self Care | Attending: Family Medicine | Admitting: Family Medicine

## 2023-12-24 ENCOUNTER — Encounter (HOSPITAL_COMMUNITY): Payer: Self-pay | Admitting: *Deleted

## 2023-12-24 ENCOUNTER — Other Ambulatory Visit: Payer: Self-pay

## 2023-12-24 DIAGNOSIS — R079 Chest pain, unspecified: Principal | ICD-10-CM | POA: Diagnosis present

## 2023-12-24 DIAGNOSIS — I10 Essential (primary) hypertension: Secondary | ICD-10-CM | POA: Diagnosis not present

## 2023-12-24 DIAGNOSIS — Z6837 Body mass index (BMI) 37.0-37.9, adult: Secondary | ICD-10-CM

## 2023-12-24 DIAGNOSIS — Z823 Family history of stroke: Secondary | ICD-10-CM

## 2023-12-24 DIAGNOSIS — E669 Obesity, unspecified: Secondary | ICD-10-CM | POA: Diagnosis present

## 2023-12-24 DIAGNOSIS — E119 Type 2 diabetes mellitus without complications: Secondary | ICD-10-CM | POA: Diagnosis present

## 2023-12-24 DIAGNOSIS — Z90710 Acquired absence of both cervix and uterus: Secondary | ICD-10-CM

## 2023-12-24 DIAGNOSIS — R072 Precordial pain: Secondary | ICD-10-CM | POA: Diagnosis present

## 2023-12-24 DIAGNOSIS — G8929 Other chronic pain: Secondary | ICD-10-CM | POA: Diagnosis present

## 2023-12-24 DIAGNOSIS — I4510 Unspecified right bundle-branch block: Secondary | ICD-10-CM | POA: Diagnosis present

## 2023-12-24 DIAGNOSIS — Z880 Allergy status to penicillin: Secondary | ICD-10-CM

## 2023-12-24 DIAGNOSIS — I200 Unstable angina: Principal | ICD-10-CM | POA: Diagnosis present

## 2023-12-24 DIAGNOSIS — Z886 Allergy status to analgesic agent status: Secondary | ICD-10-CM | POA: Diagnosis not present

## 2023-12-24 DIAGNOSIS — I119 Hypertensive heart disease without heart failure: Secondary | ICD-10-CM | POA: Diagnosis present

## 2023-12-24 DIAGNOSIS — I456 Pre-excitation syndrome: Secondary | ICD-10-CM | POA: Diagnosis present

## 2023-12-24 DIAGNOSIS — Z888 Allergy status to other drugs, medicaments and biological substances status: Secondary | ICD-10-CM

## 2023-12-24 DIAGNOSIS — Z8249 Family history of ischemic heart disease and other diseases of the circulatory system: Secondary | ICD-10-CM

## 2023-12-24 DIAGNOSIS — M797 Fibromyalgia: Secondary | ICD-10-CM | POA: Diagnosis present

## 2023-12-24 DIAGNOSIS — Z87892 Personal history of anaphylaxis: Secondary | ICD-10-CM

## 2023-12-24 DIAGNOSIS — Z794 Long term (current) use of insulin: Secondary | ICD-10-CM | POA: Diagnosis not present

## 2023-12-24 DIAGNOSIS — J45909 Unspecified asthma, uncomplicated: Secondary | ICD-10-CM | POA: Diagnosis present

## 2023-12-24 DIAGNOSIS — E785 Hyperlipidemia, unspecified: Secondary | ICD-10-CM | POA: Diagnosis present

## 2023-12-24 DIAGNOSIS — Z79899 Other long term (current) drug therapy: Secondary | ICD-10-CM

## 2023-12-24 DIAGNOSIS — Z96651 Presence of right artificial knee joint: Secondary | ICD-10-CM | POA: Diagnosis present

## 2023-12-24 DIAGNOSIS — Z7951 Long term (current) use of inhaled steroids: Secondary | ICD-10-CM | POA: Diagnosis not present

## 2023-12-24 DIAGNOSIS — E039 Hypothyroidism, unspecified: Secondary | ICD-10-CM | POA: Diagnosis present

## 2023-12-24 DIAGNOSIS — Z79890 Hormone replacement therapy (postmenopausal): Secondary | ICD-10-CM | POA: Diagnosis not present

## 2023-12-24 DIAGNOSIS — Z8701 Personal history of pneumonia (recurrent): Secondary | ICD-10-CM

## 2023-12-24 DIAGNOSIS — Z96641 Presence of right artificial hip joint: Secondary | ICD-10-CM | POA: Diagnosis present

## 2023-12-24 DIAGNOSIS — K219 Gastro-esophageal reflux disease without esophagitis: Secondary | ICD-10-CM | POA: Diagnosis present

## 2023-12-24 DIAGNOSIS — R110 Nausea: Secondary | ICD-10-CM | POA: Diagnosis present

## 2023-12-24 DIAGNOSIS — F419 Anxiety disorder, unspecified: Secondary | ICD-10-CM | POA: Diagnosis present

## 2023-12-24 DIAGNOSIS — Z7985 Long-term (current) use of injectable non-insulin antidiabetic drugs: Secondary | ICD-10-CM

## 2023-12-24 DIAGNOSIS — R0789 Other chest pain: Secondary | ICD-10-CM | POA: Diagnosis not present

## 2023-12-24 DIAGNOSIS — I259 Chronic ischemic heart disease, unspecified: Secondary | ICD-10-CM | POA: Diagnosis not present

## 2023-12-24 DIAGNOSIS — Z87891 Personal history of nicotine dependence: Secondary | ICD-10-CM | POA: Diagnosis not present

## 2023-12-24 LAB — CBG MONITORING, ED: Glucose-Capillary: 71 mg/dL (ref 70–99)

## 2023-12-24 LAB — BASIC METABOLIC PANEL WITH GFR
Anion gap: 10 (ref 5–15)
BUN: 14 mg/dL (ref 8–23)
CO2: 28 mmol/L (ref 22–32)
Calcium: 9.1 mg/dL (ref 8.9–10.3)
Chloride: 101 mmol/L (ref 98–111)
Creatinine, Ser: 0.76 mg/dL (ref 0.44–1.00)
GFR, Estimated: >60 mL/min (ref >60)
Glucose, Bld: 71 mg/dL (ref 70–99)
Potassium: 4 mmol/L (ref 3.5–5.1)
Sodium: 139 mmol/L (ref 135–145)

## 2023-12-24 LAB — APTT: aPTT: 40 s — ABNORMAL HIGH (ref 24–36)

## 2023-12-24 LAB — CBC
HCT: 42.5 % (ref 36.0–46.0)
Hemoglobin: 13.9 g/dL (ref 12.0–15.0)
MCH: 30 pg (ref 26.0–34.0)
MCHC: 32.7 g/dL (ref 30.0–36.0)
MCV: 91.6 fL (ref 80.0–100.0)
Platelets: 179 K/uL (ref 150–400)
RBC: 4.64 MIL/uL (ref 3.87–5.11)
RDW: 12.7 % (ref 11.5–15.5)
WBC: 8.3 K/uL (ref 4.0–10.5)
nRBC: 0 % (ref 0.0–0.2)

## 2023-12-24 LAB — PROTIME-INR
INR: 1 (ref 0.8–1.2)
Prothrombin Time: 13.4 s (ref 11.4–15.2)

## 2023-12-24 LAB — TROPONIN I (HIGH SENSITIVITY)
Troponin I (High Sensitivity): 4 ng/L (ref <18)
Troponin I (High Sensitivity): 4 ng/L (ref <18)

## 2023-12-24 MED ORDER — FLUOXETINE HCL 20 MG PO CAPS
40.0000 mg | ORAL_CAPSULE | Freq: Every day | ORAL | Status: DC
  Administered 2023-12-24 – 2023-12-25 (×2): 40 mg via ORAL
  Filled 2023-12-24 (×2): qty 2

## 2023-12-24 MED ORDER — ASPIRIN 81 MG PO CHEW
324.0000 mg | CHEWABLE_TABLET | Freq: Once | ORAL | Status: AC
  Administered 2023-12-24: 324 mg via ORAL
  Filled 2023-12-24: qty 4

## 2023-12-24 MED ORDER — ACETAMINOPHEN 325 MG PO TABS
650.0000 mg | ORAL_TABLET | ORAL | Status: DC | PRN
  Administered 2023-12-25: 650 mg via ORAL
  Filled 2023-12-24 (×2): qty 2

## 2023-12-24 MED ORDER — MORPHINE SULFATE (PF) 4 MG/ML IV SOLN
4.0000 mg | INTRAVENOUS | Status: DC | PRN
  Administered 2023-12-24 – 2023-12-26 (×8): 4 mg via INTRAVENOUS
  Filled 2023-12-24 (×8): qty 1

## 2023-12-24 MED ORDER — LAMOTRIGINE 100 MG PO TABS
200.0000 mg | ORAL_TABLET | Freq: Every day | ORAL | Status: DC
  Administered 2023-12-24 – 2023-12-25 (×2): 200 mg via ORAL
  Filled 2023-12-24: qty 2
  Filled 2023-12-24: qty 8

## 2023-12-24 MED ORDER — INSULIN ASPART 100 UNIT/ML IJ SOLN: 0.0000 [IU] | Freq: Three times a day (TID) | INTRAMUSCULAR | Status: DC

## 2023-12-24 MED ORDER — HEPARIN (PORCINE) 25000 UT/250ML-% IV SOLN
1300.0000 [IU]/h | INTRAVENOUS | Status: DC
  Administered 2023-12-24: 950 [IU]/h via INTRAVENOUS
  Administered 2023-12-25: 1150 [IU]/h via INTRAVENOUS
  Administered 2023-12-26: 1300 [IU]/h via INTRAVENOUS
  Filled 2023-12-24 (×3): qty 250

## 2023-12-24 MED ORDER — MORPHINE SULFATE (PF) 2 MG/ML IV SOLN
2.0000 mg | INTRAVENOUS | Status: DC | PRN
  Administered 2023-12-24: 2 mg via INTRAVENOUS
  Filled 2023-12-24: qty 1

## 2023-12-24 MED ORDER — INSULIN ASPART 100 UNIT/ML IJ SOLN: 5.0000 [IU] | Freq: Three times a day (TID) | INTRAMUSCULAR | Status: DC

## 2023-12-24 MED ORDER — INSULIN ASPART 100 UNIT/ML IJ SOLN: 0.0000 [IU] | Freq: Every day | INTRAMUSCULAR | Status: DC

## 2023-12-24 MED ORDER — LORAZEPAM 0.5 MG PO TABS: 0.5000 mg | ORAL_TABLET | Freq: Every day | ORAL | Status: DC | PRN

## 2023-12-24 MED ORDER — LEVOTHYROXINE SODIUM 100 MCG PO TABS
200.0000 ug | ORAL_TABLET | Freq: Every day | ORAL | Status: DC
  Administered 2023-12-25 – 2023-12-26 (×2): 200 ug via ORAL
  Filled 2023-12-24: qty 4
  Filled 2023-12-24: qty 2

## 2023-12-24 MED ORDER — MONTELUKAST SODIUM 10 MG PO TABS
10.0000 mg | ORAL_TABLET | Freq: Every day | ORAL | Status: DC
  Administered 2023-12-24 – 2023-12-25 (×2): 10 mg via ORAL
  Filled 2023-12-24 (×2): qty 1

## 2023-12-24 MED ORDER — INSULIN GLARGINE-YFGN 100 UNIT/ML ~~LOC~~ SOLN: 48.0000 [IU] | Freq: Every day | SUBCUTANEOUS | Status: DC

## 2023-12-24 MED ORDER — ONDANSETRON HCL 4 MG/2ML IJ SOLN
4.0000 mg | Freq: Four times a day (QID) | INTRAMUSCULAR | Status: DC | PRN
  Administered 2023-12-25: 4 mg via INTRAVENOUS
  Filled 2023-12-24: qty 2

## 2023-12-24 MED ORDER — PANTOPRAZOLE SODIUM 40 MG PO TBEC
40.0000 mg | DELAYED_RELEASE_TABLET | Freq: Two times a day (BID) | ORAL | Status: DC
  Administered 2023-12-25 – 2023-12-26 (×3): 40 mg via ORAL
  Filled 2023-12-24 (×3): qty 1

## 2023-12-24 MED ORDER — GABAPENTIN 300 MG PO CAPS
1200.0000 mg | ORAL_CAPSULE | Freq: Two times a day (BID) | ORAL | Status: DC
  Administered 2023-12-24 – 2023-12-26 (×4): 1200 mg via ORAL
  Filled 2023-12-24 (×8): qty 4

## 2023-12-24 MED ORDER — HEPARIN BOLUS VIA INFUSION
4000.0000 [IU] | Freq: Once | INTRAVENOUS | Status: AC
  Administered 2023-12-24: 4000 [IU] via INTRAVENOUS

## 2023-12-24 MED ORDER — TRAZODONE HCL 50 MG PO TABS
100.0000 mg | ORAL_TABLET | Freq: Every day | ORAL | Status: DC
  Administered 2023-12-24 – 2023-12-25 (×2): 100 mg via ORAL
  Filled 2023-12-24 (×2): qty 2

## 2023-12-24 MED ORDER — LEVOCETIRIZINE DIHYDROCHLORIDE 5 MG PO TABS: 5.0000 mg | ORAL_TABLET | Freq: Every evening | ORAL | Status: DC

## 2023-12-24 MED ORDER — MORPHINE SULFATE (PF) 4 MG/ML IV SOLN: 4.0000 mg | INTRAVENOUS | Status: DC | PRN

## 2023-12-24 MED ORDER — OXYBUTYNIN CHLORIDE 5 MG PO TABS: 5.0000 mg | ORAL_TABLET | Freq: Three times a day (TID) | ORAL | Status: DC | PRN

## 2023-12-24 MED ORDER — BUSPIRONE HCL 15 MG PO TABS
15.0000 mg | ORAL_TABLET | Freq: Two times a day (BID) | ORAL | Status: DC
  Administered 2023-12-24 – 2023-12-26 (×4): 15 mg via ORAL
  Filled 2023-12-24: qty 3
  Filled 2023-12-24: qty 1
  Filled 2023-12-24 (×2): qty 3

## 2023-12-24 MED ORDER — PRAVASTATIN SODIUM 40 MG PO TABS
80.0000 mg | ORAL_TABLET | Freq: Every day | ORAL | Status: DC
  Administered 2023-12-24 – 2023-12-25 (×2): 80 mg via ORAL
  Filled 2023-12-24 (×2): qty 2

## 2023-12-24 MED ORDER — LORATADINE 10 MG PO TABS
10.0000 mg | ORAL_TABLET | Freq: Every day | ORAL | Status: DC
  Administered 2023-12-25 – 2023-12-26 (×2): 10 mg via ORAL
  Filled 2023-12-24 (×3): qty 1

## 2023-12-24 MED ORDER — NITROGLYCERIN 2 % TD OINT
1.0000 [in_us] | TOPICAL_OINTMENT | Freq: Once | TRANSDERMAL | Status: AC
  Administered 2023-12-24: 1 [in_us] via TOPICAL
  Filled 2023-12-24: qty 1

## 2023-12-24 MED ORDER — INSULIN GLARGINE-YFGN 100 UNIT/ML ~~LOC~~ SOLN
40.0000 [IU] | Freq: Every day | SUBCUTANEOUS | Status: DC
  Administered 2023-12-25: 40 [IU] via SUBCUTANEOUS
  Filled 2023-12-24: qty 10
  Filled 2023-12-24 (×3): qty 0.4

## 2023-12-24 MED ORDER — BUPROPION HCL ER (SR) 100 MG PO TB12
200.0000 mg | ORAL_TABLET | Freq: Two times a day (BID) | ORAL | Status: DC
  Administered 2023-12-24 – 2023-12-26 (×3): 200 mg via ORAL
  Filled 2023-12-24 (×7): qty 2

## 2023-12-24 NOTE — H&P (Signed)
 History and Physical    Patient: [name] FMW:[id number] DOB: 1956-04-03 DOA: 12/24/2023 DOS: the patient was seen and examined on 12/24/2023 PCP: Lennie Karna ORN, FNP  Patient coming from: Home  Chief Complaint:  Chief Complaint  Patient presents with   Chest Pain   HPI: [name] is a 68 y.o. 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  She is seen for left substernal chest pain that started yesterday and was initially intermittent, lasting for 10 to 20 minutes then resolving.  At the time, chest pain was nonexertional.  Today, the pain started around 10 AM, tried to be more intense.  It radiated into her left arm and made her feel nauseated.  Her pain did worsen with exertion.  In addition she will get short of breath.  She took one of her husbands nitroglycerin , which helped the pain to go away, but it came back after a while.  She does have a history of a chemical stress test over 5 years ago, which was negative.   Review of Systems: As mentioned in the history of present illness. All other systems reviewed and are negative. Past Medical History:  Diagnosis Date   Anxiety    Asthma    Chronic pain    Depression    Diabetes mellitus without complication (HCC)    Fibromyalgia    GERD (gastroesophageal reflux disease)    Headache    migraines   Hyperlipidemia    Hypothyroidism    Osteoarthritis    PONV (postoperative nausea and vomiting)    Past Surgical History:  Procedure Laterality Date   ABDOMINAL HYSTERECTOMY     ANKLE RECONSTRUCTION Left    1977   APPENDECTOMY     CARPAL TUNNEL RELEASE Right    2010   CATARACT EXTRACTION Left    02/2021   catheter ablation     1993   CESAREAN SECTION     x2   CHOLECYSTECTOMY     HERNIA REPAIR     INCISION AND DRAINAGE OF WOUND Right 02/20/2023   Procedure: IRRIGATION AND DEBRIDEMENT BUTTOCKS;  Surgeon: Kallie Manuelita BROCKS, MD;   Location: [facility];  Service: General;  Laterality: Right;   KNEE ARTHROSCOPY W/ MENISCECTOMY Right    2008   KNEE DISLOCATION SURGERY Right    1977   left carpal tunnel release      panectomy     2019   TOTAL HIP ARTHROPLASTY Right 04/12/2021   Procedure: TOTAL HIP ARTHROPLASTY ANTERIOR APPROACH;  Surgeon: Melodi Lerner, MD;  Location: [facility];  Service: Orthopedics;  Laterality: Right;   TOTAL KNEE ARTHROPLASTY Right 11/28/2022   Procedure: TOTAL KNEE ARTHROPLASTY;  Surgeon: Melodi Lerner, MD;  Location: [facility];  Service: Orthopedics;  Laterality: Right;   vaginal fistula of sigmoid colon     Social History:  reports that she has quit smoking. Her smoking use included cigarettes. She has never been exposed to tobacco smoke. She has never used smokeless tobacco. She reports that she does not drink alcohol and does not use drugs.  Allergies  Allergen Reactions   Hydromorphone Shortness Of Breath and Other (See Comments)    Respiratory arrest per patient Coded with fast admin   Penicillins Anaphylaxis   Erythromycin Base Nausea And Vomiting   Liraglutide Other (See Comments)    Unknown    Meperidine  Hcl Other (See Comments)    Unknown    Nsaids Other (  See Comments)    GI bleed    Ibuprofen Other (See Comments)    Per pt, it makes her bleed   Meperidine  Hives    History reviewed. No pertinent family history.  Prior to Admission medications   Medication Sig Start Date End Date Taking? Authorizing Provider  albuterol  (VENTOLIN  HFA) 108 (90 Base) MCG/ACT inhaler Inhale 1-2 puffs into the lungs every 4 (four) hours as needed for shortness of breath or wheezing.   Yes [provider]  Ascorbic Acid (VITAMIN C) 1000 MG tablet Take 1,000 mg by mouth daily.   Yes [provider]  buPROPion  (WELLBUTRIN  SR) 200 MG 12 hr tablet Take 1 tablet (200 mg total) by mouth 2 (two) times daily. 09/05/23  Yes Mozingo, Regina Nattalie, NP  busPIRone  (BUSPAR ) 10 MG tablet Take 1.5  tablets (15 mg total) by mouth 2 (two) times daily. 05/20/22  Yes Mozingo, Regina Nattalie, NP  Cholecalciferol (VITAMIN D) 125 MCG (5000 UT) CAPS Take 2 capsules by mouth daily.   Yes [provider]  estradiol (ESTRACE) 0.5 MG tablet Take 0.5 mg by mouth daily.   Yes [provider]  ferrous gluconate  (FERGON) 324 MG tablet Take 324 mg by mouth daily with breakfast.   Yes [provider]  FLUoxetine  (PROZAC ) 40 MG capsule Take 1 capsule (40 mg total) by mouth daily. Patient taking differently: Take 40 mg by mouth at bedtime. 08/07/23  Yes Mozingo, Regina Nattalie, NP  furosemide  (LASIX ) 20 MG tablet Take 20 mg by mouth every Monday, Wednesday, and Friday.   Yes [provider]  gabapentin  (NEURONTIN ) 600 MG tablet Take 1,200 mg by mouth 2 (two) times daily.   Yes [provider]  insulin  aspart (NOVOLOG ) 100 UNIT/ML injection Inject 5-8 Units into the skin 3 (three) times daily before meals.   Yes [provider]  lamoTRIgine  (LAMICTAL ) 200 MG tablet Take one tablet at bedtime. Patient taking differently: Take 200 mg by mouth at bedtime. Take one tablet at bedtime. 07/05/23  Yes Mozingo, Regina Nattalie, NP  LANTUS  SOLOSTAR 100 UNIT/ML Solostar Pen Inject 48 Units into the skin at bedtime.   Yes [provider]  levocetirizine (XYZAL ) 5 MG tablet Take 5 mg by mouth every evening.   Yes [provider]  levothyroxine  (SYNTHROID ) 200 MCG tablet Take 200 mcg by mouth daily before breakfast.   Yes [provider]  LORazepam  (ATIVAN ) 0.5 MG tablet Take one to 2 tablets daily as needed for panic attacks. Patient taking differently: Take 0.5 mg by mouth daily as needed (panic attacks). Take one to 2 tablets daily as needed for panic attacks. 09/05/23  Yes Mozingo, Regina Nattalie, NP  LOVASTATIN PO Take 80 mg by mouth at bedtime.   Yes [provider]  montelukast  (SINGULAIR ) 10 MG tablet Take 10 mg by mouth daily.    Yes [provider]  MOUNJARO 7.5 MG/0.5ML Pen Inject 7.5 mg into the skin every Wednesday. 12/05/23  Yes [provider]  ondansetron  (ZOFRAN ) 4 MG tablet Take 1 tablet (4 mg total) by mouth every 8 (eight) hours as needed for refractory nausea / vomiting. 08/23/23  Yes Rudy Josette RAMAN, PA-C  oxybutynin  (DITROPAN ) 5 MG tablet Take 5 mg by mouth every 8 (eight) hours as needed for bladder spasms. 12/15/21  Yes [provider]  oxyCODONE  (ROXICODONE ) 15 MG immediate release tablet Take 15 mg by mouth every 6 (six) hours as needed for pain.   Yes [provider]  pantoprazole  (PROTONIX ) 40 MG tablet Take 1 tablet (40 mg total) by mouth 2 (two) times daily before a meal. 08/23/23  Yes Rudy Josette RAMAN, PA-C  rizatriptan  (MAXALT ) 10 MG tablet Take 1 tablet (10 mg total) by mouth every 6 (six) hours as needed for migraine. May repeat in 2 hours if needed 04/04/23  Yes Gayland Lauraine PARAS, NP  traZODone  (DESYREL ) 100 MG tablet Take 1 tablet (100 mg total) by mouth at bedtime. 09/05/23  Yes Mozingo, Regina Nattalie, NP  zinc gluconate 50 MG tablet Take 50 mg by mouth daily.   Yes [provider]    Physical Exam: Vitals:   12/24/23 1452 12/24/23 1500 12/24/23 1545 12/24/23 1600  BP:  122/67 129/60 119/76  Pulse: 71 70 68 69  Resp: (!) 9 (!) 24 18 19   SpO2: 96% 99% 97% 97%  Weight:      Height:       General: Older female. Awake and alert and oriented x3. No acute cardiopulmonary distress.  HEENT: Normocephalic atraumatic.  Right and left ears normal in appearance.  Pupils equal, round, reactive to light. Extraocular muscles are intact. Sclerae anicteric and noninjected.  Moist mucosal membranes. No mucosal lesions.  Neck: Neck supple without lymphadenopathy. No carotid bruits. No masses palpated.  Cardiovascular: Regular rate with normal S1-S2 sounds. No murmurs, rubs, gallops auscultated. No JVD.  Respiratory: Good respiratory effort with no wheezes, rales,  rhonchi. Lungs clear to auscultation bilaterally.  No accessory muscle use. Abdomen: Soft, nontender, nondistended. Active bowel sounds. No masses or hepatosplenomegaly  Skin: No rashes, lesions, or ulcerations.  Dry, warm to touch. 2+ dorsalis pedis and radial pulses. Musculoskeletal: No calf or leg pain. All major joints not erythematous nontender.  No upper or lower joint deformation.  Good ROM.  No contractures  Psychiatric: Intact judgment and insight. Pleasant and cooperative. Neurologic: No focal neurological deficits. Strength is 5/5 and symmetric in upper and lower extremities.  Cranial nerves II through XII are grossly intact.  Data Reviewed: I have reviewed patient's labs and imaging  Assessment and Plan: No notes have been filed under this hospital service. Service: Hospitalist  Principal Problem:   Chest pain Active Problems:   HTN (hypertension)   Hyperlipidemia   DM2 (diabetes mellitus, type 2) (HCC)   Fibromyalgia   Hypothyroidism   Asthma  Chest pain This patient is still having chest pain, will admit to stepdown Has Nitropaste on Will start morphine .  If patient still having chest pain after this, will start nitroglycerin  drip Cycle troponins Telemetry monitoring EDP discussed with cardiology, recommended heparin  drip Hypertension Continue antihypertensives Diabetes Glycemic control Hypothyroidism Continue levothyroxine  Hyperlipidemia On rosuvastatin    Advance Care Planning:   Code Status: Full Code confirmed by patient  Consults: Cardiology  Family Communication: Husband present during interview and exam  Severity of Illness: The appropriate patient status for this patient is INPATIENT. Inpatient status is judged to be reasonable and necessary in order to provide the required intensity of service to ensure the patient's safety. The patient's presenting symptoms, physical exam findings, and initial radiographic and laboratory data in the context of  their chronic comorbidities is felt to place them at high risk for further clinical deterioration. Furthermore, it is not anticipated that the patient will be medically stable for discharge from the hospital within 2 midnights of admission.   * I certify that at the point of admission it is my clinical judgment that the patient will require inpatient hospital care spanning beyond  2 midnights from the point of admission due to high intensity of service, high risk for further deterioration and high frequency of surveillance required.*  Author: Hagop Mccollam J Oyindamola Key, DO 12/24/2023 6:11 PM  For on call review [email or web address].

## 2023-12-24 NOTE — ED Triage Notes (Signed)
 Pt c/o intermittent chest pain that started x 2 days ago and has gotten worse today with the pain not letting up  Pt c/o nausea and sob  Pt took 2 nitroglycerin  tabs with no relief

## 2023-12-24 NOTE — ED Provider Notes (Signed)
 [place] EMERGENCY DEPARTMENT AT [facility] Provider Note   CSN: [id number] Arrival date & time: 12/24/23  1441     Patient presents with: Chest Pain   [name] is a 68 y.o. female.  {Add pertinent medical, surgical, social history, OB history to HPI:32947}  Chest Pain  This patient is a 68 year old female, history of WPW status post ablation many years ago, history of diabetes on insulin , she is also on furosemide , gabapentin , lovastatin, she presents to the hospital with a complaint of pain in the chest.  This is in the upper left chest which is started yesterday, was coming on sporadically lasting for 10 to 20 minutes and then resolving, initially was not exertional.  Today the pain started about 5 hours ago and seem to be more intense, it is now radiating down her left arm making her feel nauseated and occasionally sweaty.  She does get short of breath when she walks today.  She has never had obstructive disease, she took one of her husbands nitroglycerin  which helped but the pain came back after she took it.  She does not have a local cardiologist    Prior to Admission medications   Medication Sig Start Date End Date Taking? Authorizing Provider  albuterol  (VENTOLIN  HFA) 108 (90 Base) MCG/ACT inhaler Inhale into the lungs.    [provider]  Ascorbic Acid (VITAMIN C) 1000 MG tablet Take 1,000 mg by mouth daily.    [provider]  buPROPion  (WELLBUTRIN  SR) 200 MG 12 hr tablet Take 1 tablet (200 mg total) by mouth 2 (two) times daily. 09/05/23   Mozingo, Regina Nattalie, NP  busPIRone  (BUSPAR ) 10 MG tablet Take 1.5 tablets (15 mg total) by mouth 2 (two) times daily. 05/20/22   Mozingo, Regina Nattalie, NP  Cholecalciferol (VITAMIN D) 125 MCG (5000 UT) CAPS Take 2 capsules by mouth daily.    [provider]  COLLAGEN PO Take 6,000 mg by mouth daily.    [provider]  cyanocobalamin 2000 MCG tablet Take 2,000 mcg by mouth daily.     [provider]  estradiol (ESTRACE) 0.5 MG tablet Take 0.5 mg by mouth daily.    [provider]  ferrous gluconate  (FERGON) 324 MG tablet Take 324 mg by mouth daily with breakfast.    [provider]  FLUoxetine  (PROZAC ) 40 MG capsule Take 1 capsule (40 mg total) by mouth daily. 08/07/23   Mozingo, Regina Nattalie, NP  furosemide  (LASIX ) 20 MG tablet Take 20 mg by mouth every Monday, Wednesday, and Friday.    [provider]  gabapentin  (NEURONTIN ) 600 MG tablet Take 600 mg by mouth in the morning, at noon, in the evening, and at bedtime.    [provider]  Galcanezumab -gnlm (EMGALITY ) 120 MG/ML SOAJ Inject 120 mg into the skin every 30 (thirty) days. 04/04/23   Gayland Lauraine PARAS, NP  insulin  aspart (NOVOLOG ) 100 UNIT/ML injection Inject 5-8 Units into the skin 3 (three) times daily before meals.    [provider]  lamoTRIgine  (LAMICTAL ) 200 MG tablet Take one tablet at bedtime. 07/05/23   Mozingo, Regina Nattalie, NP  LANTUS  SOLOSTAR 100 UNIT/ML Solostar Pen Inject 46 Units into the skin daily. At hs    [provider]  levocetirizine (XYZAL ) 5 MG tablet Take 5 mg by mouth every evening.    [provider]  levothyroxine  (SYNTHROID ) 200 MCG tablet Take 175 mcg by mouth daily before breakfast.    [provider]  LORazepam  (ATIVAN ) 0.5 MG tablet Take one to 2 tablets daily as needed for panic attacks. 09/05/23   Mozingo, Regina Nattalie, NP  LOVASTATIN PO Take 80 mg by mouth at bedtime.    [provider]  meclizine  (ANTIVERT ) 25 MG tablet Take 25 mg by mouth 3 (three) times daily as needed for dizziness.    [provider]  montelukast  (SINGULAIR ) 10 MG tablet Take 10 mg by mouth daily.    [provider]  nystatin  (MYCOSTATIN /NYSTOP ) powder Apply 1 Application topically 3 (three) times daily. 11/07/23   Kallie Manuelita BROCKS, MD  ondansetron  (ZOFRAN ) 4 MG tablet Take 1 tablet (4 mg total) by mouth  every 8 (eight) hours as needed for refractory nausea / vomiting. 08/23/23   Rudy Josette RAMAN, PA-C  oxybutynin  (DITROPAN ) 5 MG tablet Take 5 mg by mouth every 8 (eight) hours as needed. 12/15/21   [provider]  Oxycodone  HCl 20 MG TABS Take 15 mg by mouth.    [provider]  pantoprazole  (PROTONIX ) 40 MG tablet Take 1 tablet (40 mg total) by mouth 2 (two) times daily before a meal. 08/23/23   Rudy, Josette RAMAN, PA-C  rizatriptan  (MAXALT ) 10 MG tablet Take 1 tablet (10 mg total) by mouth every 6 (six) hours as needed for migraine. May repeat in 2 hours if needed 04/04/23   Gayland Lauraine PARAS, NP  SYMBICORT 80-4.5 MCG/ACT inhaler Inhale into the lungs. 11/17/22   [provider]  tirzepatide CLOYDE) 2.5 MG/0.5ML Pen Inject 2.5 mg into the skin once a week. Tuesdays    [provider]  traZODone  (DESYREL ) 100 MG tablet Take 1 tablet (100 mg total) by mouth at bedtime. 09/05/23   Mozingo, Regina Nattalie, NP  zinc gluconate 50 MG tablet Take 50 mg by mouth daily.    [provider]    Allergies: Hydromorphone, Penicillins, Erythromycin base, Ibuprofen, Liraglutide, Meperidine , Meperidine  hcl, and Nsaids    Review of Systems  Cardiovascular:  Positive for chest pain.  All other systems reviewed and are negative.   Updated Vital Signs BP (!) 117/55   Pulse 71   Resp (!) 9   Ht 1.6 m (5' 3)   Wt 96.2 kg   SpO2 96%   BMI 37.55 kg/m   Physical Exam Vitals and nursing note reviewed.  Constitutional:      General: She is not in acute distress.    Appearance: She is well-developed.  HENT:     Head: Normocephalic and atraumatic.     Mouth/Throat:     Pharynx: No oropharyngeal exudate.  Eyes:     General: No scleral icterus.       Right eye: No discharge.        Left eye: No discharge.     Conjunctiva/sclera: Conjunctivae normal.     Pupils: Pupils are equal, round, and reactive to light.  Neck:     Thyroid: No thyromegaly.     Vascular: No  JVD.  Cardiovascular:     Rate and Rhythm: Normal rate and regular rhythm.     Heart sounds: Normal heart sounds. No murmur heard.    No friction rub. No gallop.  Pulmonary:     Effort: Pulmonary effort is normal. No respiratory distress.     Breath sounds: Normal breath sounds. No wheezing or rales.  Abdominal:     General: Bowel sounds are normal. There is no distension.     Palpations: Abdomen is soft. There is no mass.  Tenderness: There is no abdominal tenderness.  Musculoskeletal:        General: No tenderness. Normal range of motion.     Cervical back: Normal range of motion and neck supple.     Right lower leg: Edema present.     Left lower leg: No edema.     Comments: Chronic edema of the right lower extremity in the pretibial region, prior total knee arthroplasty on the right  Lymphadenopathy:     Cervical: No cervical adenopathy.  Skin:    General: Skin is warm and dry.     Findings: No erythema or rash.  Neurological:     General: No focal deficit present.     Mental Status: She is alert.     Coordination: Coordination normal.  Psychiatric:        Behavior: Behavior normal.     (all labs ordered are listed, but only abnormal results are displayed) Labs Reviewed  BASIC METABOLIC PANEL WITH GFR  CBC  TROPONIN I (HIGH SENSITIVITY)    EKG: EKG Interpretation Date/Time:  Sunday December 24 2023 14:51:38 EDT Ventricular Rate:  72 PR Interval:  172 QRS Duration:  160 QT Interval:  442 QTC Calculation: 484 R Axis:   41  Text Interpretation: Sinus rhythm Right bundle branch block since last tracing no significant change Confirmed by Cleotilde Rogue ([id number]) on 12/24/2023 3:00:25 PM  Radiology: No results found.  {Document cardiac monitor, telemetry assessment procedure when appropriate:32947} Procedures   Medications Ordered in the ED  nitroGLYCERIN  (NITROGLYN) 2 % ointment 1 inch (has no administration in time range)  aspirin  chewable tablet 324 mg (has no  administration in time range)      {Click here for ABCD2, HEART and other calculators REFRESH Note before signing:1}                              Medical Decision Making Amount and/or Complexity of Data Reviewed Labs: ordered. Radiology: ordered.  Risk OTC drugs. Prescription drug management.    This patient presents to the ED for concern of chest pain, this involves an extensive number of treatment options, and is a complaint that carries with it a high risk of complications and morbidity.  The differential diagnosis includes acute coronary syndrome, seems less likely to be pneumonia pneumothorax or pericarditis, she has no fevers chills or hypoxia, she is not tachycardic or hypotensive.  Her EKG was reviewed and shows right bundle branch block consistent with prior EKGs   Co morbidities / Chronic conditions that complicate the patient evaluation  Obesity, diabetes   Additional history obtained:  Additional history obtained from EMR External records from outside source obtained and reviewed including medical record, admitted approximately 2 months ago with multifocal pneumonia to outside hospital   Lab Tests:  I Ordered, and personally interpreted labs.  The pertinent results include:  ***   Imaging Studies ordered:  I ordered imaging studies including ***  I independently visualized and interpreted imaging which showed *** I agree with the radiologist interpretation   Cardiac Monitoring: / EKG:  The patient was maintained on a cardiac monitor.  I personally viewed and interpreted the cardiac monitored which showed an underlying rhythm of: ***   Problem List / ED Course / Critical interventions / Medication management  *** I ordered medication including ***   Reevaluation of the patient after these medicines showed that the patient *** I have reviewed the patients home medicines  and have made adjustments as needed   Consultations Obtained:  I requested  consultation with the ***,  and discussed lab and imaging findings as well as pertinent plan - they recommend: ***   Social Determinants of Health:  ***   Test / Admission - Considered:  ***   {Document critical care time when appropriate  Document review of labs and clinical decision tools ie CHADS2VASC2, etc  Document your independent review of radiology images and any outside records  Document your discussion with family members, caretakers and with consultants  Document social determinants of health affecting pt's care  Document your decision making why or why not admission, treatments were needed:32947:::1}   Final diagnoses:  None    ED Discharge Orders     None

## 2023-12-24 NOTE — ED Notes (Signed)
 Called to take pt upstairs. They are not able to take her at this time. And will let us  know when we can take her.

## 2023-12-24 NOTE — Consult Note (Signed)
 Pharmacy Consult Note - Anticoagulation  Pharmacy Consult for heparin  Indication: chest pain/ACS  PATIENT MEASUREMENTS: Height: 5' 3 (160 cm) Weight: 96.2 kg (212 lb) IBW/kg (Calculated) : 52.4 HEPARIN  DW (KG): 74.7  VITAL SIGNS: BP: 119/76 (07/13 1600) Pulse Rate: 69 (07/13 1600)  Recent Labs    12/24/23 1522 12/24/23 1724  HGB 13.9  --   HCT 42.5  --   PLT 179  --   APTT  --  40*  LABPROT  --  13.4  INR  --  1.0  CREATININE 0.76  --   TROPONINIHS 4  --     Estimated Creatinine Clearance: 77.4 mL/min (by C-G formula based on SCr of 0.76 mg/dL).  PAST MEDICAL HISTORY: Past Medical History:  Diagnosis Date   Anxiety    Asthma    Chronic pain    Depression    Diabetes mellitus without complication (HCC)    Fibromyalgia    GERD (gastroesophageal reflux disease)    Headache    migraines   Hyperlipidemia    Hypothyroidism    Osteoarthritis    PONV (postoperative nausea and vomiting)     ASSESSMENT: 68 y.o. female with PMH including GIB, IDA, HLD, pressure ulcers is presenting with intermittent now worsening chest pain, along with nausea and shortness of breath, unrelieved by two tabs of nitroglycerin  SL. cTn unremarkable, but there is concern for ACS. Patient is not on chronic anticoagulation per chart review. Pharmacy has been consulted to initiate and manage heparin  intravenous infusion. Hgb and PLT appear stable.  Pertinent medications: No chronic anticoagulation prior to admission per chart review  Goal(s) of therapy: Heparin  level 0.3 - 0.7 units/mL Monitor platelets by anticoagulation protocol: Yes   Baseline anticoagulation labs: Recent Labs    12/24/23 1522 12/24/23 1724  APTT  --  40*  INR  --  1.0  HGB 13.9  --   PLT 179  --        PLAN: Give 4000 units bolus x1; then start heparin  infusion at 950 units/hour. Check heparin  level in 6 hours, then daily once at least two levels are consecutively therapeutic. Monitor CBC daily while on  heparin  infusion.   Will M. Lenon, PharmD Clinical Pharmacist 12/24/2023 5:44 PM

## 2023-12-25 ENCOUNTER — Encounter (HOSPITAL_COMMUNITY): Payer: Self-pay | Admitting: Family Medicine

## 2023-12-25 ENCOUNTER — Inpatient Hospital Stay (HOSPITAL_COMMUNITY)

## 2023-12-25 DIAGNOSIS — R079 Chest pain, unspecified: Secondary | ICD-10-CM | POA: Diagnosis not present

## 2023-12-25 DIAGNOSIS — E785 Hyperlipidemia, unspecified: Secondary | ICD-10-CM

## 2023-12-25 DIAGNOSIS — R0789 Other chest pain: Secondary | ICD-10-CM

## 2023-12-25 DIAGNOSIS — I259 Chronic ischemic heart disease, unspecified: Secondary | ICD-10-CM

## 2023-12-25 DIAGNOSIS — I10 Essential (primary) hypertension: Secondary | ICD-10-CM

## 2023-12-25 LAB — ECHOCARDIOGRAM COMPLETE
AR max vel: 1.97 cm2
AV Area VTI: 2.03 cm2
AV Area mean vel: 1.92 cm2
AV Mean grad: 5 mmHg
AV Peak grad: 9.1 mmHg
Ao pk vel: 1.51 m/s
Area-P 1/2: 3.34 cm2
Height: 63 in
MV M vel: 1.81 m/s
MV Peak grad: 13.1 mmHg
S' Lateral: 3.45 cm
Weight: 3343.94 [oz_av]

## 2023-12-25 LAB — GLUCOSE, CAPILLARY
Glucose-Capillary: 102 mg/dL — ABNORMAL HIGH (ref 70–99)
Glucose-Capillary: 161 mg/dL — ABNORMAL HIGH (ref 70–99)

## 2023-12-25 LAB — HEMOGLOBIN A1C
Hgb A1c MFr Bld: 5.5 % (ref 4.8–5.6)
Mean Plasma Glucose: 111 mg/dL

## 2023-12-25 LAB — CBG MONITORING, ED
Glucose-Capillary: 78 mg/dL (ref 70–99)
Glucose-Capillary: 92 mg/dL (ref 70–99)
Glucose-Capillary: 86 mg/dL (ref 70–99)

## 2023-12-25 LAB — CBC
HCT: 43 % (ref 36.0–46.0)
Hemoglobin: 13.5 g/dL (ref 12.0–15.0)
MCH: 29.2 pg (ref 26.0–34.0)
MCHC: 31.4 g/dL (ref 30.0–36.0)
MCV: 92.9 fL (ref 80.0–100.0)
Platelets: 179 K/uL (ref 150–400)
RBC: 4.63 MIL/uL (ref 3.87–5.11)
RDW: 13.1 % (ref 11.5–15.5)
WBC: 6.6 K/uL (ref 4.0–10.5)
nRBC: 0 % (ref 0.0–0.2)

## 2023-12-25 LAB — HEPARIN LEVEL (UNFRACTIONATED)
Heparin Unfractionated: 0.27 [IU]/mL — ABNORMAL LOW (ref 0.30–0.70)
Heparin Unfractionated: 0.34 [IU]/mL (ref 0.30–0.70)
Heparin Unfractionated: 0.45 [IU]/mL (ref 0.30–0.70)

## 2023-12-25 LAB — TROPONIN I (HIGH SENSITIVITY)
Troponin I (High Sensitivity): 4 ng/L (ref <18)
Troponin I (High Sensitivity): 3 ng/L (ref <18)

## 2023-12-25 MED ORDER — DOCUSATE SODIUM 100 MG PO CAPS: 100.0000 mg | ORAL_CAPSULE | Freq: Every day | ORAL | Status: DC | PRN

## 2023-12-25 MED ORDER — POLYETHYLENE GLYCOL 3350 17 G PO PACK
17.0000 g | PACK | Freq: Once | ORAL | Status: AC
  Administered 2023-12-25: 17 g via ORAL
  Filled 2023-12-25: qty 1

## 2023-12-25 MED ORDER — ASPIRIN 81 MG PO CHEW
324.0000 mg | CHEWABLE_TABLET | Freq: Every day | ORAL | Status: DC
  Administered 2023-12-25 – 2023-12-26 (×2): 324 mg via ORAL
  Filled 2023-12-25 (×2): qty 4

## 2023-12-25 NOTE — Progress Notes (Signed)
 PROGRESS NOTE    Patient: [name]                            PCP: Lennie Karna ORN, FNP                    DOB: 1955-11-09            DOA: 12/24/2023 FMW:[id number]             DOS: 12/25/2023, 10:41 AM   LOS: 1 day   Date of Service: The patient was seen and examined on 12/25/2023  Subjective:   The patient was seen and examined this morning. Hemodynamically stable. Reporting improved chest pain with current pain medications But described the pain as substernal, radiating to her left not associate with shortness of breath or diaphoresis  Continue to be on heparin  drip -N.p.o. No issues overnight .  Brief Narrative:    [name] is a 68 y.o. 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  She is seen for left substernal chest pain that started yesterday and was initially intermittent, lasting for 10 to 20 minutes then resolving.  At the time, chest pain was nonexertional.  Today, the pain started around 10 AM, tried to be more intense.  It radiated into her left arm and made her feel nauseated.  Her pain did worsen with exertion.  In addition she will get short of breath.  She took one of her husbands nitroglycerin , which helped the pain to go away, but it came back after a while.  She does have a history of a chemical stress test over 5 years ago, which was negative.    Chest pain - Substernal chest pain with radiation to the left shoulder, denies any shortness of breath, not diaphoretic -On heparin  drip -Troponin 4, 4, 3 -No obvious EKG changes -Pending cardiology evaluation-Dr. Alvan following -Pending echo -N.p.o. - as Nitropaste on, as needed morphine   - Telemetry monitoring   Hypertension - Continue antihypertensives  Diabetes - Checking CBG q. ACHS, SSI coverage - Glycemic control  Hypothyroidism Continue levothyroxine   Hyperlipidemia - Continue high-dose  rosuvastatin   ----------------------------------------------------------------------------------------------------------------------------------------------- Nutritional status:  The patient's BMI is: Body mass index is 37.55 kg/m. I agree with the assessment and plan as outlined     DVT prophylaxis:  On heparin  drip   Code Status:   Code Status: Full Code  Family Communication: No family member present at bedside- -Advance care planning has been discussed.   Admission status:   Status is: Inpatient Remains inpatient appropriate because: For evaluation of chest pain, ruling out ACS, MI   Disposition: From  - home             Planning for discharge in 1-2 days: to home  Procedures:   No admission procedures for hospital encounter.   Antimicrobials:  Anti-infectives (From admission, onward)    None        Medication:   aspirin   324 mg Oral Daily   buPROPion   200 mg Oral BID   busPIRone   15 mg Oral BID   FLUoxetine   40 mg Oral QHS   gabapentin   1,200 mg Oral BID   insulin  aspart  0-15 Units Subcutaneous TID WC   insulin  aspart  0-5 Units Subcutaneous QHS   insulin  glargine-yfgn  40 Units Subcutaneous QHS   lamoTRIgine   200 mg  Oral QHS   levothyroxine   200 mcg Oral QAC breakfast   loratadine   10 mg Oral Daily   montelukast   10 mg Oral Daily   pantoprazole   40 mg Oral BID AC   pravastatin   80 mg Oral q1800   traZODone   100 mg Oral QHS    acetaminophen , LORazepam , morphine  injection, ondansetron  (ZOFRAN ) IV, oxybutynin    Objective:   Vitals:   12/25/23 0600 12/25/23 0752 12/25/23 0800 12/25/23 0900  BP: 124/73  114/73 110/74  Pulse: 69  71 73  Resp: 14  12 (!) 9  Temp:  (!) 97.4 F (36.3 C)    TempSrc:  Axillary    SpO2: 94%  93% 91%  Weight:      Height:       No intake or output data in the 24 hours ending 12/25/23 1041 Filed Weights   12/24/23 1450  Weight: 96.2 kg     Physical examination:   Constitution:  Alert, cooperative, no  distress,  Appears calm and comfortable  Psychiatric:   Normal and stable mood and affect, cognition intact,   HEENT:        Normocephalic, PERRL, otherwise with in Normal limits  Chest:         Chest symmetric Cardio vascular:  S1/S2, RRR, No murmure, No Rubs or Gallops  pulmonary: Clear to auscultation bilaterally, respirations unlabored, negative wheezes / crackles Abdomen: Soft, non-tender, non-distended, bowel sounds,no masses, no organomegaly Muscular skeletal: Limited exam - in bed, able to move all 4 extremities,   Neuro: CNII-XII intact. , normal motor and sensation, reflexes intact  Extremities: No pitting edema lower extremities, +2 pulses  Skin: Dry, warm to touch, negative for any Rashes, No open wounds Wounds: per nursing documentation   ------------------------------------------------------------------------------------------------------------------------------------------    LABs:     Latest Ref Rng & Units 12/25/2023    7:09 AM 12/24/2023    3:22 PM 10/20/2023   12:57 PM  CBC  WBC 4.0 - 10.5 K/uL 6.6  8.3  9.4   Hemoglobin 12.0 - 15.0 g/dL 86.4  86.0  86.6   Hematocrit 36.0 - 46.0 % 43.0  42.5  42.0   Platelets 150 - 400 K/uL 179  179  191       Latest Ref Rng & Units 12/24/2023    3:22 PM 10/20/2023   12:57 PM 02/17/2023   10:05 AM  CMP  Glucose 70 - 99 mg/dL 71  820  850   BUN 8 - 23 mg/dL 14  12  12    Creatinine 0.44 - 1.00 mg/dL 9.23  9.22  9.11   Sodium 135 - 145 mmol/L 139  137  140   Potassium 3.5 - 5.1 mmol/L 4.0  4.0  4.3   Chloride 98 - 111 mmol/L 101  98  100   CO2 22 - 32 mmol/L 28  29  30    Calcium  8.9 - 10.3 mg/dL 9.1  9.4  9.2        Micro Results No results found for this or any previous visit (from the past 240 hours).  Radiology Reports DG Chest 2 View Result Date: 12/24/2023 CLINICAL DATA:  Chest pain EXAM: CHEST - 2 VIEW COMPARISON:  None Available. FINDINGS: The heart size and mediastinal contours are within normal limits. Mildly low  lung volumes. No focal consolidations. No pleural effusions. No pneumothorax. The visualized skeletal structures are unremarkable. IMPRESSION: No active cardiopulmonary disease. Electronically Signed   By: Michaeline Blanch M.D.   On:  12/24/2023 15:53    SIGNED: Adriana DELENA Grams, MD, FHM. FAAFP. Jolynn Pack - [facility] Time spent - 35 min.  In seeing, evaluating and examining the patient. Reviewing medical records, labs, drawn plan of care. [facility],  Pager (please use [email or web address] to page/ text) Please use Epic Secure Chat for non-urgent communication (7AM-7PM)  If 7PM-7AM, please contact night-coverage [email or web address], 12/25/2023, 10:41 AM

## 2023-12-25 NOTE — Consult Note (Signed)
 Pharmacy Consult Note - Anticoagulation  Pharmacy Consult for heparin  Indication: chest pain/ACS  PATIENT MEASUREMENTS: Height: 5' 3 (160 cm) Weight: 96.2 kg (212 lb) IBW/kg (Calculated) : 52.4 HEPARIN  DW (KG): 74.7  VITAL SIGNS: Temp: 97.4 F (36.3 C) (07/14 0752) Temp Source: Axillary (07/14 0752) BP: 124/73 (07/14 0600) Pulse Rate: 69 (07/14 0600)  Recent Labs    12/24/23 1522 12/24/23 1718 12/24/23 1724 12/24/23 2355 12/25/23 0449 12/25/23 0709  HGB 13.9  --   --   --   --  13.5  HCT 42.5  --   --   --   --  43.0  PLT 179  --   --   --   --  179  APTT  --   --  40*  --   --   --   LABPROT  --   --  13.4  --   --   --   INR  --   --  1.0  --   --   --   HEPARINUNFRC  --   --   --    < >  --  0.34  CREATININE 0.76  --   --   --   --   --   TROPONINIHS 4   < >  --    < > 3  --    < > = values in this interval not displayed.    Estimated Creatinine Clearance: 77.4 mL/min (by C-G formula based on SCr of 0.76 mg/dL).  PAST MEDICAL HISTORY: Past Medical History:  Diagnosis Date   Anxiety    Asthma    Chronic pain    Depression    Diabetes mellitus without complication (HCC)    Fibromyalgia    GERD (gastroesophageal reflux disease)    Headache    migraines   Hyperlipidemia    Hypothyroidism    Osteoarthritis    PONV (postoperative nausea and vomiting)     ASSESSMENT: 68 y.o. female with PMH including GIB, IDA, HLD, pressure ulcers is presenting with intermittent now worsening chest pain, along with nausea and shortness of breath, unrelieved by two tabs of nitroglycerin  SL. cTn unremarkable, but there is concern for ACS. Patient is not on chronic anticoagulation per chart review. Pharmacy has been consulted to initiate and manage heparin  intravenous infusion.   HL 0.34- therapeutic CBC WNL  Goal(s) of therapy: Heparin  level 0.3 - 0.7 units/mL Monitor platelets by anticoagulation protocol: Yes   Baseline anticoagulation labs: Recent Labs     12/24/23 1522 12/24/23 1724 12/25/23 0709  APTT  --  40*  --   INR  --  1.0  --   HGB 13.9  --  13.5  PLT 179  --  179       PLAN: Continue heparin  infusion at 1150 units/hr Heparin  level in 6 hours and daily Continue to monitor H&H and platelets.    Elspeth Sour, PharmD Clinical Pharmacist 12/25/2023 9:07 AM

## 2023-12-25 NOTE — Hospital Course (Addendum)
 [name] is a 68 y.o. 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  She is seen for left substernal chest pain that started yesterday and was initially intermittent, lasting for 10 to 20 minutes then resolving.  At the time, chest pain was nonexertional.  Today, the pain started around 10 AM, tried to be more intense.  It radiated into her left arm and made her feel nauseated.  Her pain did worsen with exertion.  In addition she will get short of breath.  She took one of her husbands nitroglycerin , which helped the pain to go away, but it came back after a while.  She does have a history of a chemical stress test over 5 years ago, which was negative.     Assessment & Plan:   Principal Problem:   Chest pain Active Problems:   HTN (hypertension)   Hyperlipidemia   DM2 (diabetes mellitus, type 2) (HCC)   Fibromyalgia   Hypothyroidism   Asthma  Chest pain - Substernal chest pain with radiation to the left shoulder, denies any shortness of breath, not diaphoretic -On heparin  drip -Troponin 4, 4, 3 -No obvious EKG changes -Pending cardiology evaluation-Dr. Alvan following -Pending echo -N.p.o. - as Nitropaste on, as needed morphine   - Telemetry monitoring   Hypertension - Continue antihypertensives  Diabetes - Checking CBG q. ACHS, SSI coverage - Glycemic control  Hypothyroidism Continue levothyroxine   Hyperlipidemia - Continue high-dose rosuvastatin

## 2023-12-25 NOTE — ED Notes (Signed)
Requested missing medication from pharmacy

## 2023-12-25 NOTE — Plan of Care (Signed)
  Problem: Education: Goal: Knowledge of General Education information will improve Description: Including pain rating scale, medication(s)/side effects and non-pharmacologic comfort measures Outcome: Progressing   Problem: Health Behavior/Discharge Planning: Goal: Ability to manage health-related needs will improve Outcome: Progressing   Problem: Clinical Measurements: Goal: Ability to maintain clinical measurements within normal limits will improve Outcome: Progressing Goal: Will remain free from infection Outcome: Progressing Goal: Diagnostic test results will improve Outcome: Progressing Goal: Respiratory complications will improve Outcome: Progressing Goal: Cardiovascular complication will be avoided Outcome: Progressing   Problem: Activity: Goal: Risk for activity intolerance will decrease Outcome: Progressing   Problem: Nutrition: Goal: Adequate nutrition will be maintained Outcome: Progressing   Problem: Coping: Goal: Level of anxiety will decrease Outcome: Progressing   Problem: Elimination: Goal: Will not experience complications related to bowel motility Outcome: Progressing Goal: Will not experience complications related to urinary retention Outcome: Progressing   Problem: Pain Managment: Goal: General experience of comfort will improve and/or be controlled Outcome: Progressing   Problem: Safety: Goal: Ability to remain free from injury will improve Outcome: Progressing   Problem: Skin Integrity: Goal: Risk for impaired skin integrity will decrease Outcome: Progressing   Problem: Education: Goal: Ability to describe self-care measures that may prevent or decrease complications (Diabetes Survival Skills Education) will improve Outcome: Progressing Goal: Individualized Educational Video(s) Outcome: Progressing   Problem: Coping: Goal: Ability to adjust to condition or change in health will improve Outcome: Progressing   Problem: Fluid  Volume: Goal: Ability to maintain a balanced intake and output will improve Outcome: Progressing   Problem: Health Behavior/Discharge Planning: Goal: Ability to identify and utilize available resources and services will improve Outcome: Progressing Goal: Ability to manage health-related needs will improve Outcome: Progressing   Problem: Metabolic: Goal: Ability to maintain appropriate glucose levels will improve Outcome: Progressing   Problem: Nutritional: Goal: Maintenance of adequate nutrition will improve Outcome: Progressing Goal: Progress toward achieving an optimal weight will improve Outcome: Progressing   Problem: Skin Integrity: Goal: Risk for impaired skin integrity will decrease Outcome: Progressing   Problem: Tissue Perfusion: Goal: Adequacy of tissue perfusion will improve Outcome: Progressing   Problem: Education: Goal: Understanding of cardiac disease, CV risk reduction, and recovery process will improve Outcome: Progressing Goal: Individualized Educational Video(s) Outcome: Progressing   Problem: Activity: Goal: Ability to tolerate increased activity will improve Outcome: Progressing   Problem: Cardiac: Goal: Ability to achieve and maintain adequate cardiovascular perfusion will improve Outcome: Progressing   Problem: Health Behavior/Discharge Planning: Goal: Ability to safely manage health-related needs after discharge will improve Outcome: Progressing

## 2023-12-25 NOTE — Consult Note (Cosign Needed)
 Cardiology Consultation   Patient ID: [name] MRN: [id number]; DOB: 14-Oct-1955  Admit date: 12/24/2023 Date of Consult: 12/25/2023  PCP:  [name] ORN, FNP   [facility] Providers Cardiologist:  None   - NEW   Patient Profile: [name] is a 68 y.o. female with a hx of DM, HTN, HLD, WPW s/p ablation in 1993, RBBB since 2019, hypothyroidism who is being seen 12/25/2023 for the evaluation of chest pain at the request of Dr. Willette.  History of Present Illness: [name] has not been seen by heart care in the past.  Per care everywhere, patient was last seen by [name] cardiology with [facility] in 08/2017.  Cath in 2006 with no significant stenosis, normal coronary angiography and normal left ventricular wall motion. Lexiscan  stress test at [facility] in 02/2017 showed normal perfusion with no defects/ischemia with EF 65%.  Echo in 08/2017 showed EF of 60%, normal LV size/wall motion, mild RA enlargement, PA pressure of 43 mmHg and no significant valvular abnormalities.  Presented to [facility] ED 7/13 for chest pain. BMP/CBC WNL, TN 4>3.  EKG showed NSR, HR 71, chronic RBBB with no acute ST changes (similar to previous).  CXR with no active cardiopulmonary disease. Treated with ASA 324 mg, heparin  4000 U bolus X1, NTG paste X1, heparin  infusion, and morphine .   On interview, patient reported intermittent chest pain that started 2 days ago that has worsened to constant chest pain since yesterday, described as heaviness/squeezing, located in midsternal, radiates down the left arm, severity 7/10.  Initial chest pain occurred mainly at rest and some episodes with exertion. Tried x1 NTG 2 days ago with some relief then NTG again yesterday with no relief.  Notes this chest pain is worse compared to chest pain associated with previous stress test.  No changes with position. Prior to this episode, patient denies any exertional CP or shortness of breath with walking or  while completing household activity.  Associated symptoms include SOB, nausea. Denies diaphoresis, palpitations, dizziness, syncope edema or any heartburn symptoms. No recent trauma, fever, cough or excessive lifting.  Reports having pneumonia in May and completed ABX.  Family history significant for father (stroke, MI in late 79s) and mother (HTN).  Quit smoking approximately 40 years ago after 2 to 3 years.  Denies any excessive EtOH or drugs.  Reports medication compliance with statin.  She has not been on HTN due to normal blood pressure for years.  Past Medical History:  Diagnosis Date   Anxiety    Asthma    Chronic pain    Depression    Diabetes mellitus without complication (HCC)    Fibromyalgia    GERD (gastroesophageal reflux disease)    Headache    migraines   Hyperlipidemia    Hypothyroidism    Osteoarthritis    PONV (postoperative nausea and vomiting)     Past Surgical History:  Procedure Laterality Date   ABDOMINAL HYSTERECTOMY     ANKLE RECONSTRUCTION Left    1977   APPENDECTOMY     CARPAL TUNNEL RELEASE Right    2010   CATARACT EXTRACTION Left    02/2021   catheter ablation     1993   CESAREAN SECTION     x2   CHOLECYSTECTOMY     HERNIA REPAIR     INCISION AND DRAINAGE OF WOUND Right 02/20/2023   Procedure: IRRIGATION AND DEBRIDEMENT BUTTOCKS;  Surgeon: Kallie Manuelita BROCKS, MD;  Location: [facility]  ORS;  Service: General;  Laterality: Right;   KNEE ARTHROSCOPY W/ MENISCECTOMY Right    2008   KNEE DISLOCATION SURGERY Right    1977   left carpal tunnel release      panectomy     2019   TOTAL HIP ARTHROPLASTY Right 04/12/2021   Procedure: TOTAL HIP ARTHROPLASTY ANTERIOR APPROACH;  Surgeon: Melodi Lerner, MD;  Location: [facility]S;  Service: Orthopedics;  Laterality: Right;   TOTAL KNEE ARTHROPLASTY Right 11/28/2022   Procedure: TOTAL KNEE ARTHROPLASTY;  Surgeon: Melodi Lerner, MD;  Location: [facility] ORS;  Service: Orthopedics;  Laterality: Right;   vaginal fistula of sigmoid  colon       Home Medications:  Prior to Admission medications   Medication Sig Start Date End Date Taking? Authorizing Provider  albuterol  (VENTOLIN  HFA) 108 (90 Base) MCG/ACT inhaler Inhale 1-2 puffs into the lungs every 4 (four) hours as needed for shortness of breath or wheezing.   Yes [provider]  Ascorbic Acid (VITAMIN C) 1000 MG tablet Take 1,000 mg by mouth daily.   Yes [provider]  buPROPion  (WELLBUTRIN  SR) 200 MG 12 hr tablet Take 1 tablet (200 mg total) by mouth 2 (two) times daily. 09/05/23  Yes Mozingo, Regina Nattalie, NP  busPIRone  (BUSPAR ) 10 MG tablet Take 1.5 tablets (15 mg total) by mouth 2 (two) times daily. 05/20/22  Yes Mozingo, Regina Nattalie, NP  Cholecalciferol (VITAMIN D) 125 MCG (5000 UT) CAPS Take 2 capsules by mouth daily.   Yes [provider]  estradiol (ESTRACE) 0.5 MG tablet Take 0.5 mg by mouth daily.   Yes [provider]  ferrous gluconate  (FERGON) 324 MG tablet Take 324 mg by mouth daily with breakfast.   Yes [provider]  FLUoxetine  (PROZAC ) 40 MG capsule Take 1 capsule (40 mg total) by mouth daily. Patient taking differently: Take 40 mg by mouth at bedtime. 08/07/23  Yes Mozingo, Regina Nattalie, NP  furosemide  (LASIX ) 20 MG tablet Take 20 mg by mouth every Monday, Wednesday, and Friday.   Yes [provider]  gabapentin  (NEURONTIN ) 600 MG tablet Take 1,200 mg by mouth 2 (two) times daily.   Yes [provider]  insulin  aspart (NOVOLOG ) 100 UNIT/ML injection Inject 5-8 Units into the skin 3 (three) times daily before meals.   Yes [provider]  lamoTRIgine  (LAMICTAL ) 200 MG tablet Take one tablet at bedtime. Patient taking differently: Take 200 mg by mouth at bedtime. Take one tablet at bedtime. 07/05/23  Yes Mozingo, Regina Nattalie, NP  LANTUS  SOLOSTAR 100 UNIT/ML Solostar Pen Inject 48 Units into the skin at bedtime.   Yes [provider]  levocetirizine (XYZAL ) 5  MG tablet Take 5 mg by mouth every evening.   Yes [provider]  levothyroxine  (SYNTHROID ) 200 MCG tablet Take 200 mcg by mouth daily before breakfast.   Yes [provider]  LORazepam  (ATIVAN ) 0.5 MG tablet Take one to 2 tablets daily as needed for panic attacks. Patient taking differently: Take 0.5 mg by mouth daily as needed (panic attacks). Take one to 2 tablets daily as needed for panic attacks. 09/05/23  Yes Mozingo, Regina Nattalie, NP  LOVASTATIN PO Take 80 mg by mouth at bedtime.   Yes [provider]  montelukast  (SINGULAIR ) 10 MG tablet Take 10 mg by mouth daily.   Yes [provider]  MOUNJARO 7.5 MG/0.5ML Pen Inject 7.5 mg into the skin every Wednesday. 12/05/23  Yes [provider]  ondansetron  (ZOFRAN ) 4  MG tablet Take 1 tablet (4 mg total) by mouth every 8 (eight) hours as needed for refractory nausea / vomiting. 08/23/23  Yes Rudy Neptune S, PA-C  oxybutynin  (DITROPAN ) 5 MG tablet Take 5 mg by mouth every 8 (eight) hours as needed for bladder spasms. 12/15/21  Yes [provider]  oxyCODONE  (ROXICODONE ) 15 MG immediate release tablet Take 15 mg by mouth every 6 (six) hours as needed for pain.   Yes [provider]  pantoprazole  (PROTONIX ) 40 MG tablet Take 1 tablet (40 mg total) by mouth 2 (two) times daily before a meal. 08/23/23  Yes Rudy Neptune RAMAN, PA-C  rizatriptan  (MAXALT ) 10 MG tablet Take 1 tablet (10 mg total) by mouth every 6 (six) hours as needed for migraine. May repeat in 2 hours if needed 04/04/23  Yes Gayland Lauraine PARAS, NP  traZODone  (DESYREL ) 100 MG tablet Take 1 tablet (100 mg total) by mouth at bedtime. 09/05/23  Yes Mozingo, Regina Nattalie, NP  zinc gluconate 50 MG tablet Take 50 mg by mouth daily.   Yes [provider]    Scheduled Meds:  aspirin   324 mg Oral Daily   buPROPion   200 mg Oral BID   busPIRone   15 mg Oral BID   FLUoxetine   40 mg Oral QHS   gabapentin   1,200 mg Oral BID    insulin  aspart  0-15 Units Subcutaneous TID WC   insulin  aspart  0-5 Units Subcutaneous QHS   insulin  glargine-yfgn  40 Units Subcutaneous QHS   lamoTRIgine   200 mg Oral QHS   levothyroxine   200 mcg Oral QAC breakfast   loratadine   10 mg Oral Daily   montelukast   10 mg Oral Daily   pantoprazole   40 mg Oral BID AC   pravastatin   80 mg Oral q1800   traZODone   100 mg Oral QHS   Continuous Infusions:  heparin  1,150 Units/hr (12/25/23 0053)   PRN Meds: acetaminophen , LORazepam , morphine  injection, ondansetron  (ZOFRAN ) IV, oxybutynin   Allergies:    Allergies  Allergen Reactions   Hydromorphone Shortness Of Breath and Other (See Comments)    Respiratory arrest per patient Coded with fast admin   Penicillins Anaphylaxis   Erythromycin Base Nausea And Vomiting   Liraglutide Other (See Comments)    Unknown    Meperidine  Hcl Other (See Comments)    Unknown    Nsaids Other (See Comments)    GI bleed    Ibuprofen Other (See Comments)    Per pt, it makes her bleed   Meperidine  Hives    Social History:   Social History   Socioeconomic History   Marital status: Divorced    Spouse name: Not on file   Number of children: Not on file   Years of education: Not on file   Highest education level: Not on file  Occupational History   Not on file  Tobacco Use   Smoking status: Former    Types: Cigarettes    Passive exposure: Never   Smokeless tobacco: Never  Vaping Use   Vaping status: Never Used  Substance and Sexual Activity   Alcohol use: Never   Drug use: Never   Sexual activity: Not Currently    Birth control/protection: None  Other Topics Concern   Not on file  Social History Narrative   Caffiene 1-2 cups daily.   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      Family History:   Family History  Problem Relation Age of Onset   Hypertension Mother    Heart attack Father        Late 36's   Stroke Father      ROS:  Please see the  history of present illness.  All other ROS reviewed and negative.     Physical Exam/Data: Vitals:   12/25/23 0400 12/25/23 0500 12/25/23 0600 12/25/23 0752  BP: 115/80 123/62 124/73   Pulse: 70 66 69   Resp: 10 20 14    Temp:    (!) 97.4 F (36.3 C)  TempSrc:    Axillary  SpO2: 91% 93% 94%   Weight:      Height:       No intake or output data in the 24 hours ending 12/25/23 0847    12/24/2023    2:50 PM 11/22/2023    9:42 AM 11/07/2023    9:07 AM  Last 3 Weights  Weight (lbs) 212 lb 213 lb 212 lb  Weight (kg) 96.163 kg 96.616 kg 96.163 kg     Body mass index is 37.55 kg/m.  General:  Well nourished, well developed, in no acute distress HEENT: normal Neck: no JVD Vascular: No carotid bruits; Distal pulses 2+ bilaterally Cardiac:  normal S1, S2; RRR; no murmur; reproducible chest pain in mid sternal area. Lungs:  clear to auscultation bilaterally, no wheezing, rhonchi or rales  Abd: soft, nontender, no hepatomegaly  Ext: no edema Musculoskeletal:  No deformities, BUE and BLE strength normal and equal Skin: warm and dry  Neuro:  CNs 2-12 intact, no focal abnormalities noted Psych:  Normal affect   EKG:  The EKG was personally reviewed and demonstrates:  NSR, HR 71, chronic RBBB Telemetry:  Telemetry was personally reviewed and demonstrates:  NSR, HR 70's  Relevant CV Studies: ECHO pending   *TTE 08/25/17: 1. Left ventricle: The left ventricular cavity size is normal. Wall thickness is normal. Systolic function is normal. The ejection fraction is 60%. No segmental wall motion abnormalities. Inconclusive diastolic evaluation due to insufficient data. 2. Right atrium: The right atrium is mildly dilated. 3. Tricuspid valve: There is mild tricuspid regurgitation. 4. Pulmonary arteries: Pulmonary artery estimated peak systolic pressure: 43 mm Hg. This is consistent with mild to moderate pulmonary hypertension.  *Lexiscan  Cardiolite stress test performed at [facility]  on 03/07/2017 showed normal perfusion with no defects to suggest ischemia or scar, EF 65%.   2006 Cath   Left main coronary artery:  The left main coronary was free of significant  disease.    Left anterior descending artery:  The left anterior descending artery gave  rise to three septal perforators and two diagonal branches.  These and the  LAD proper with free of significant disease.    Circumflex artery:  The circumflex artery gave rise to an atrial branch,  marginal branch, and a posterior lateral branch.  These vessels were free of  significant disease.    Right coronary artery:  The right coronary artery was a moderately large  vessel that gave rise to two right ventricular branches, posterior  descending branch, and three posterior lateral branches.  These vessels were  free of significant disease.    LEFT VENTRICULOGRAM:  The left ventriculogram performed in the RAO  projection showed good wall motion with no areas of hypokinesis.  The  estimated ejection fraction was 60%.    CONCLUSIONS:  Normal coronary angiography and normal left ventricular wall  motion.  Laboratory Data: High Sensitivity Troponin:  Recent Labs  Lab 12/24/23 1522 12/24/23 1718 12/25/23 0046 12/25/23 0449  TROPONINIHS 4 4 4 3      Chemistry Recent Labs  Lab 12/24/23 1522  NA 139  K 4.0  CL 101  CO2 28  GLUCOSE 71  BUN 14  CREATININE 0.76  CALCIUM  9.1  GFRNONAA >60  ANIONGAP 10    No results for input(s): PROT, ALBUMIN, AST, ALT, ALKPHOS, BILITOT in the last 168 hours. Lipids No results for input(s): CHOL, TRIG, HDL, LABVLDL, LDLCALC, CHOLHDL in the last 168 hours.  Hematology Recent Labs  Lab 12/24/23 1522 12/25/23 0709  WBC 8.3 6.6  RBC 4.64 4.63  HGB 13.9 13.5  HCT 42.5 43.0  MCV 91.6 92.9  MCH 30.0 29.2  MCHC 32.7 31.4  RDW 12.7 13.1  PLT 179 179   Thyroid No results for input(s): TSH, FREET4 in the last 168 hours.  BNPNo results for  input(s): BNP, PROBNP in the last 168 hours.  DDimer No results for input(s): DDIMER in the last 168 hours.  Radiology/Studies:  DG Chest 2 View Result Date: 12/24/2023 CLINICAL DATA:  Chest pain EXAM: CHEST - 2 VIEW COMPARISON:  None Available. FINDINGS: The heart size and mediastinal contours are within normal limits. Mildly low lung volumes. No focal consolidations. No pleural effusions. No pneumothorax. The visualized skeletal structures are unremarkable. IMPRESSION: No active cardiopulmonary disease. Electronically Signed   By: Michaeline Blanch M.D.   On: 12/24/2023 15:53     Assessment and Plan: Chest pain  Cath in 2006 with no significant stenosis, normal coronary angiography and normal left ventricular wall motion.  Lexiscan  stress test at [facility] in 02/2017 showed normal perfusion with no defects/ischemia with EF 65%.   Echo in 08/2017 showed EF of 60%, normal LV size/wall motion, mild RA enlargement, PA pressure of 43 mmHg and no significant valvular abnormalities. Presents with typical and atypical chest pain features as above.  Reproducible chest pain in the midsternal on exam.  Currently, chest pain is still present.  No relief with morphine  or NTG.  EKG showed NSR, HR 71, chronic RBBB with no acute ST changes (similar to previous).  Treated with ASA 324 mg, heparin  4000 U bolus X1, NTG paste X1, heparin  infusion, and morphine . Less concerned about ischemia considering negative troponins, EKG without acute changes, previous cardio workouts, and no tobacco use.  Discussed cath, exercise versus Lexiscan  stress test, and coronary CTA.  Discussed that cath was less likely at this time.  Echo pending. She reported on her previous stress test she tried the exercise option but was converted over to Lexiscan . Patient would prefer Lexiscan  stress test if cath is not indicated.  Can consider for inpatient stress test for tomorrow.  Will discuss with MD.   HTN BP appears  well-controlled: 124/73 Continue to monitor  HLD Continue pravastatin  80 mg daily Managed by PCP   WPW s/p ablation in 1993 No known recurrences since ablation.  Denies any palpitations  RBBB  Per chart review, present since 2019  DM 02/2023 A1C 6.3   Risk Assessment/Risk Scores:    TIMI Risk Score for Unstable Angina or Non-ST Elevation MI:   The patient's TIMI risk score is 3, which indicates a 13% risk of all cause mortality, new or recurrent myocardial infarction or need for urgent revascularization in the next 14 days.         For questions or updates, please contact [facility] Please consult [email or web address] for contact info under  Signed, Lorette CINDERELLA Kapur, PA-C  12/25/2023 8:47 AM   Attending Note  Patient seen and discussed with PA Kapur, I agree with her documentation. History of DM2, HTN, HLD, WPW with prior ablation presents with chest pain.      K 4 Cr 0.76 BUN 14 WBC 8.3 Hgb 13.9 Plt 179  Trop 4-->4-->4-->3 EKG SR, RBBB, nonspecific ST/T changes CXR: no acute process     Cath in 2006 with no significant stenosis, normal coronary angiography and normal left ventricular wall motion. Lexiscan  stress test at [facility] in 02/2017 showed normal perfusion with no defects/ischemia with EF 65%.  Echo in 08/2017 showed EF of 60%, normal LV size/wall motion, mild RA enlargement, PA pressure of 43 mmHg and no significant valvular abnormalities.   1.Chest pain - fairly atypical, primarily in the duration lasting well voer 24 hours - no objective evidence of ischemia thus far.  - plan echo, pending results most likely lexiscan .    Dorn Ross MD

## 2023-12-25 NOTE — Progress Notes (Incomplete)
 Attending Note   Patient seen and discussed with PA Sheron, I agree with her documentation. History of DM2, HTN, HLD, WPW with prior ablation presents with chest pain.     K 4 Cr 0.76 BUN 14 WBC 8.3 Hgb 13.9 Plt 179  Trop 4-->4-->4-->3 EKG SR, RBBB, nonspecific ST/T changes CXR: no acute process   Cath in 2006 with no significant stenosis, normal coronary angiography and normal left ventricular wall motion. Lexiscan  stress test at [facility] in 02/2017 showed normal perfusion with no defects/ischemia with EF 65%.  Echo in 08/2017 showed EF of 60%, normal LV size/wall motion, mild RA enlargement, PA pressure of 43 mmHg and no significant valvular abnormalities.

## 2023-12-25 NOTE — Consult Note (Signed)
 Pharmacy Consult Note  Pharmacy Consult for heparin  Indication: chest pain/ACS Brief A/P: Heparin  level subtherapeutic Increase Heparin  rate  PATIENT MEASUREMENTS: Height: 5' 3 (160 cm) Weight: 96.2 kg (212 lb) IBW/kg (Calculated) : 52.4 HEPARIN  DW (KG): 74.7  VITAL SIGNS: Temp: 97.9 F (36.6 C) (07/13 1815) Temp Source: Oral (07/13 1815) BP: 109/58 (07/14 0020) Pulse Rate: 70 (07/14 0020)  Recent Labs    12/24/23 1522 12/24/23 1718 12/24/23 1724 12/24/23 2355  HGB 13.9  --   --   --   HCT 42.5  --   --   --   PLT 179  --   --   --   APTT  --   --  40*  --   LABPROT  --   --  13.4  --   INR  --   --  1.0  --   HEPARINUNFRC  --   --   --  0.27*  CREATININE 0.76  --   --   --   TROPONINIHS 4 4  --   --     Estimated Creatinine Clearance: 77.4 mL/min (by C-G formula based on SCr of 0.76 mg/dL).  ASSESSMENT: 68 y.o. female with chest pain for heparin    Goal(s) of therapy: Heparin  level 0.3 - 0.7 units/mL Monitor platelets by anticoagulation protocol: Yes   Baseline anticoagulation labs: Recent Labs    12/24/23 1522 12/24/23 1724  APTT  --  40*  INR  --  1.0  HGB 13.9  --   PLT 179  --        PLAN: Increase Heparin  1150 units/hr Follow-up am labs.  Cathlyn Arrant, PharmD, BCPS  12/25/2023 12:49 AM

## 2023-12-25 NOTE — Progress Notes (Signed)
Nitro patch removed at this time.

## 2023-12-25 NOTE — Consult Note (Signed)
 Pharmacy Consult Note - Anticoagulation  Pharmacy Consult for heparin  Indication: chest pain/ACS  PATIENT MEASUREMENTS: Height: 5' 3 (160 cm) Weight: 94.8 kg (208 lb 15.9 oz) IBW/kg (Calculated) : 52.4 HEPARIN  DW (KG): 74.7  VITAL SIGNS: Temp: 97.9 F (36.6 C) (07/14 1226) Temp Source: Oral (07/14 1226) BP: 121/65 (07/14 1226) Pulse Rate: 73 (07/14 1226)  Recent Labs    12/24/23 1522 12/24/23 1718 12/24/23 1724 12/24/23 2355 12/25/23 0449 12/25/23 0709 12/25/23 1534  HGB 13.9  --   --   --   --  13.5  --   HCT 42.5  --   --   --   --  43.0  --   PLT 179  --   --   --   --  179  --   APTT  --   --  40*  --   --   --   --   LABPROT  --   --  13.4  --   --   --   --   INR  --   --  1.0  --   --   --   --   HEPARINUNFRC  --   --   --    < >  --  0.34 0.45  CREATININE 0.76  --   --   --   --   --   --   TROPONINIHS 4   < >  --    < > 3  --   --    < > = values in this interval not displayed.    Estimated Creatinine Clearance: 76.8 mL/min (by C-G formula based on SCr of 0.76 mg/dL).  PAST MEDICAL HISTORY: Past Medical History:  Diagnosis Date   Anxiety    Asthma    Chronic pain    Depression    Diabetes mellitus without complication (HCC)    Fibromyalgia    GERD (gastroesophageal reflux disease)    Headache    migraines   Hyperlipidemia    Hypothyroidism    Osteoarthritis    PONV (postoperative nausea and vomiting)     ASSESSMENT: 68 y.o. female with PMH including GIB, IDA, HLD, pressure ulcers is presenting with intermittent now worsening chest pain, along with nausea and shortness of breath, unrelieved by two tabs of nitroglycerin  SL. cTn unremarkable, but there is concern for ACS. Patient is not on chronic anticoagulation per chart review. Pharmacy has been consulted to initiate and manage heparin  intravenous infusion.   HL 0.45- therapeutic CBC WNL  Goal(s) of therapy: Heparin  level 0.3 - 0.7 units/mL Monitor platelets by anticoagulation protocol:  Yes   Baseline anticoagulation labs: Recent Labs    12/24/23 1522 12/24/23 1724 12/25/23 0709  APTT  --  40*  --   INR  --  1.0  --   HGB 13.9  --  13.5  PLT 179  --  179       PLAN: Continue heparin  infusion at 1150 units/hr Heparin  level daily Continue to monitor H&H and platelets.    Elspeth Sour, PharmD Clinical Pharmacist 12/25/2023 4:01 PM

## 2023-12-25 NOTE — Progress Notes (Signed)
*  PRELIMINARY RESULTS* Echocardiogram 2D Echocardiogram has been performed.  [name] 12/25/2023, 3:47 PM

## 2023-12-25 NOTE — Progress Notes (Signed)
 Transition of Care Department [facility]) has reviewed patient and no other TOC needs have been identified at this time. We will continue to monitor patient advancement through interdisciplinary progression rounds. If new patient transition needs arise, please place a TOC consult.   12/25/23 0857  TOC Brief Assessment  Insurance and Status Reviewed  Patient has primary care physician Yes  Home environment has been reviewed Lives with mother-in-law  Prior level of function: Independent.  Prior/Current Home Services No current home services  Social Drivers of Health Review SDOH reviewed no interventions necessary  Readmission risk has been reviewed Yes  Transition of care needs no transition of care needs at this time

## 2023-12-25 NOTE — ED Notes (Signed)
 ED TO INPATIENT HANDOFF REPORT  ED Nurse Name and Phone #: [phone]  S Name/Age/Gender [name] Cons 68 y.o. female Room/Bed: APA16A/APA16A  Code Status   Code Status: Full Code  Home/SNF/Other Home Patient oriented to: self, place, time, and situation Is this baseline? Yes   Triage Complete: Triage complete  Chief Complaint Chest pain [R07.9]  Triage Note Pt c/o intermittent chest pain that started x 2 days ago and has gotten worse today with the pain not letting up  Pt c/o nausea and sob  Pt took 2 nitroglycerin  tabs with no relief    Allergies Allergies  Allergen Reactions   Hydromorphone Shortness Of Breath and Other (See Comments)    Respiratory arrest per patient Coded with fast admin   Penicillins Anaphylaxis   Erythromycin Base Nausea And Vomiting   Liraglutide Other (See Comments)    Unknown    Meperidine  Hcl Other (See Comments)    Unknown    Nsaids Other (See Comments)    GI bleed    Ibuprofen Other (See Comments)    Per pt, it makes her bleed   Meperidine  Hives    Level of Care/Admitting Diagnosis ED Disposition     ED Disposition  Admit   Condition  --   Comment  Hospital Area: [facility] [[id number]]  Level of Care: Telemetry [5]  Covid Evaluation: Asymptomatic - no recent exposure (last 10 days) testing not required  Diagnosis: Chest pain [255200]  Admitting Physician: WILLETTE ADRIANA LABOR [[id number]]  Attending Physician: WILLETTE ADRIANA LABOR [phone]  Certification:: I certify this patient will need inpatient services for at least 2 midnights  Expected Medical Readiness: 12/27/2023          B Medical/Surgery History Past Medical History:  Diagnosis Date   Anxiety    Asthma    Chronic pain    Depression    Diabetes mellitus without complication (HCC)    Fibromyalgia    GERD (gastroesophageal reflux disease)    Headache    migraines   Hyperlipidemia    Hypothyroidism    Osteoarthritis    PONV (postoperative nausea and  vomiting)    Past Surgical History:  Procedure Laterality Date   ABDOMINAL HYSTERECTOMY     ANKLE RECONSTRUCTION Left    1977   APPENDECTOMY     CARPAL TUNNEL RELEASE Right    2010   CATARACT EXTRACTION Left    02/2021   catheter ablation     1993   CESAREAN SECTION     x2   CHOLECYSTECTOMY     HERNIA REPAIR     INCISION AND DRAINAGE OF WOUND Right 02/20/2023   Procedure: IRRIGATION AND DEBRIDEMENT BUTTOCKS;  Surgeon: Kallie Manuelita BROCKS, MD;  Location: [facility];  Service: General;  Laterality: Right;   KNEE ARTHROSCOPY W/ MENISCECTOMY Right    2008   KNEE DISLOCATION SURGERY Right    1977   left carpal tunnel release      panectomy     2019   TOTAL HIP ARTHROPLASTY Right 04/12/2021   Procedure: TOTAL HIP ARTHROPLASTY ANTERIOR APPROACH;  Surgeon: Melodi Lerner, MD;  Location: [facility];  Service: Orthopedics;  Laterality: Right;   TOTAL KNEE ARTHROPLASTY Right 11/28/2022   Procedure: TOTAL KNEE ARTHROPLASTY;  Surgeon: Melodi Lerner, MD;  Location: [facility];  Service: Orthopedics;  Laterality: Right;   vaginal fistula of sigmoid colon       A IV Location/Drains/Wounds Patient Lines/Drains/Airways Status     Active Line/Drains/Airways  Name Placement date Placement time Site Days   Peripheral IV 12/24/23 20 G Posterior;Right Forearm 12/24/23  1804  Forearm  1            Intake/Output Last 24 hours No intake or output data in the 24 hours ending 12/25/23 1156  Labs/Imaging Results for orders placed or performed during the hospital encounter of 12/24/23 (from the past 48 hours)  Basic metabolic panel     Status: None   Collection Time: 12/24/23  3:22 PM  Result Value Ref Range   Sodium 139 135 - 145 mmol/L   Potassium 4.0 3.5 - 5.1 mmol/L   Chloride 101 98 - 111 mmol/L   CO2 28 22 - 32 mmol/L   Glucose, Bld 71 70 - 99 mg/dL    Comment: Glucose reference range applies only to samples taken after fasting for at least 8 hours.   BUN 14 8 - 23 mg/dL   Creatinine, Ser  9.23 0.44 - 1.00 mg/dL   Calcium  9.1 8.9 - 10.3 mg/dL   GFR, Estimated >39 >39 mL/min    Comment: (NOTE) Calculated using the CKD-EPI Creatinine Equation (2021)    Anion gap 10 5 - 15    Comment: Performed at [facility], [address], [place], [place] [address]  CBC     Status: None   Collection Time: 12/24/23  3:22 PM  Result Value Ref Range   WBC 8.3 4.0 - 10.5 K/uL   RBC 4.64 3.87 - 5.11 MIL/uL   Hemoglobin 13.9 12.0 - 15.0 g/dL   HCT 57.4 63.9 - 53.9 %   MCV 91.6 80.0 - 100.0 fL   MCH 30.0 26.0 - 34.0 pg   MCHC 32.7 30.0 - 36.0 g/dL   RDW 87.2 88.4 - 84.4 %   Platelets 179 150 - 400 K/uL   nRBC 0.0 0.0 - 0.2 %    Comment: Performed at [facility], [address] [place], [place] [address]  Troponin I (High Sensitivity)     Status: None   Collection Time: 12/24/23  3:22 PM  Result Value Ref Range   Troponin I (High Sensitivity) 4 <18 ng/L    Comment: (NOTE) Elevated high sensitivity troponin I (hsTnI) values and significant  changes across serial measurements may suggest ACS but many other  chronic and acute conditions are known to elevate hsTnI results.  Refer to the Links section for chest pain algorithms and additional  guidance. Performed at [facility], [address] [place], [place] [address]   Troponin I (High Sensitivity)     Status: None   Collection Time: 12/24/23  5:18 PM  Result Value Ref Range   Troponin I (High Sensitivity) 4 <18 ng/L    Comment: (NOTE) Elevated high sensitivity troponin I (hsTnI) values and significant  changes across serial measurements may suggest ACS but many other  chronic and acute conditions are known to elevate hsTnI results.  Refer to the Links section for chest pain algorithms and additional  guidance. Performed at [facility], [address], [place], [place] [address]   APTT     Status: Abnormal   Collection Time: 12/24/23  5:24 PM  Result Value Ref Range   aPTT 40 (H) 24 - 36 seconds    Comment:        IF  BASELINE aPTT IS ELEVATED, SUGGEST PATIENT RISK ASSESSMENT BE USED TO DETERMINE APPROPRIATE ANTICOAGULANT THERAPY. Performed at [facility], [address] [place], [place] [address]   Protime-INR  Status: None   Collection Time: 12/24/23  5:24 PM  Result Value Ref Range   Prothrombin Time 13.4 11.4 - 15.2 seconds   INR 1.0 0.8 - 1.2    Comment: (NOTE) INR goal varies based on device and disease states. Performed at [facility], [address], [place], [place] [address]   CBG monitoring, ED     Status: None   Collection Time: 12/24/23  9:49 PM  Result Value Ref Range   Glucose-Capillary 71 70 - 99 mg/dL    Comment: Glucose reference range applies only to samples taken after fasting for at least 8 hours.  Heparin  level (unfractionated)     Status: Abnormal   Collection Time: 12/24/23 11:55 PM  Result Value Ref Range   Heparin  Unfractionated 0.27 (L) 0.30 - 0.70 IU/mL    Comment: (NOTE) The clinical reportable range upper limit is being lowered to >1.10 to align with the FDA approved guidance for the current laboratory assay.  If heparin  results are below expected values, and patient dosage has  been confirmed, suggest follow up testing of antithrombin III levels. Performed at [facility], [address] [place], [place] [address]   Troponin I (High Sensitivity)     Status: None   Collection Time: 12/25/23 12:46 AM  Result Value Ref Range   Troponin I (High Sensitivity) 4 <18 ng/L    Comment: (NOTE) Elevated high sensitivity troponin I (hsTnI) values and significant  changes across serial measurements may suggest ACS but many other  chronic and acute conditions are known to elevate hsTnI results.  Refer to the Links section for chest pain algorithms and additional  guidance. Performed at [facility], [address], [place], [place] [address]   Troponin I (High Sensitivity)     Status: None   Collection Time: 12/25/23  4:49 AM  Result Value Ref Range   Troponin I  (High Sensitivity) 3 <18 ng/L    Comment: (NOTE) Elevated high sensitivity troponin I (hsTnI) values and significant  changes across serial measurements may suggest ACS but many other  chronic and acute conditions are known to elevate hsTnI results.  Refer to the Links section for chest pain algorithms and additional  guidance. Performed at [facility], [address], [place], [place] [address]   Heparin  level (unfractionated)     Status: None   Collection Time: 12/25/23  7:09 AM  Result Value Ref Range   Heparin  Unfractionated 0.34 0.30 - 0.70 IU/mL    Comment: (NOTE) The clinical reportable range upper limit is being lowered to >1.10 to align with the FDA approved guidance for the current laboratory assay.  If heparin  results are below expected values, and patient dosage has  been confirmed, suggest follow up testing of antithrombin III levels. Performed at [facility], [address], [place], [place] [address]   CBC     Status: None   Collection Time: 12/25/23  7:09 AM  Result Value Ref Range   WBC 6.6 4.0 - 10.5 K/uL   RBC 4.63 3.87 - 5.11 MIL/uL   Hemoglobin 13.5 12.0 - 15.0 g/dL   HCT 56.9 63.9 - 53.9 %   MCV 92.9 80.0 - 100.0 fL   MCH 29.2 26.0 - 34.0 pg   MCHC 31.4 30.0 - 36.0 g/dL   RDW 86.8 88.4 - 84.4 %   Platelets 179 150 - 400 K/uL   nRBC 0.0 0.0 - 0.2 %    Comment: Performed at [facility], [address]  [place], [place] [address]  CBG monitoring, ED     Status: None   Collection Time: 12/25/23  7:36 AM  Result Value Ref Range   Glucose-Capillary 78 70 - 99 mg/dL    Comment: Glucose reference range applies only to samples taken after fasting for at least 8 hours.  CBG monitoring, ED     Status: None   Collection Time: 12/25/23  9:36 AM  Result Value Ref Range   Glucose-Capillary 92 70 - 99 mg/dL    Comment: Glucose reference range applies only to samples taken after fasting for at least 8 hours.   DG Chest 2 View Result Date: 12/24/2023 CLINICAL  DATA:  Chest pain EXAM: CHEST - 2 VIEW COMPARISON:  None Available. FINDINGS: The heart size and mediastinal contours are within normal limits. Mildly low lung volumes. No focal consolidations. No pleural effusions. No pneumothorax. The visualized skeletal structures are unremarkable. IMPRESSION: No active cardiopulmonary disease. Electronically Signed   By: Michaeline Blanch M.D.   On: 12/24/2023 15:53    Pending Labs Unresulted Labs (From admission, onward)     Start     Ordered   12/25/23 1500  Heparin  level (unfractionated)  Once-Timed,   TIMED        12/25/23 0909   12/25/23 0800  Heparin  level (unfractionated)  Daily,   R      12/25/23 0051   12/25/23 0800  CBC  Daily,   R      12/25/23 0051   12/24/23 2012  Hemoglobin A1c  Once,   R       Comments: To assess prior glycemic control    12/24/23 2011            Vitals/Pain Today's Vitals   12/25/23 0800 12/25/23 0822 12/25/23 0900 12/25/23 1051  BP: 114/73  110/74   Pulse: 71  73   Resp: 12  (!) 9   Temp:      TempSrc:      SpO2: 93%  91%   Weight:      Height:      PainSc:  4   6     Isolation Precautions No active isolations  Medications Medications  heparin  ADULT infusion 100 units/mL (25000 units/250mL) (1,150 Units/hr Intravenous Rate/Dose Verify 12/25/23 1005)  pravastatin  (PRAVACHOL ) tablet 80 mg (80 mg Oral Given 12/24/23 2229)  buPROPion  ER (WELLBUTRIN  SR) 12 hr tablet 200 mg (200 mg Oral Not Given 12/25/23 1001)  busPIRone  (BUSPAR ) tablet 15 mg (15 mg Oral Given 12/25/23 1004)  FLUoxetine  (PROZAC ) capsule 40 mg (40 mg Oral Given 12/24/23 2219)  LORazepam  (ATIVAN ) tablet 0.5 mg (has no administration in time range)  traZODone  (DESYREL ) tablet 100 mg (100 mg Oral Given 12/24/23 2219)  levothyroxine  (SYNTHROID ) tablet 200 mcg (200 mcg Oral Given 12/25/23 0747)  pantoprazole  (PROTONIX ) EC tablet 40 mg (40 mg Oral Given 12/25/23 0747)  oxybutynin  (DITROPAN ) tablet 5 mg (has no administration in time range)  gabapentin   (NEURONTIN ) capsule 1,200 mg (1,200 mg Oral Given 12/25/23 1004)  lamoTRIgine  (LAMICTAL ) tablet 200 mg (200 mg Oral Given 12/24/23 2216)  montelukast  (SINGULAIR ) tablet 10 mg (10 mg Oral Given 12/24/23 2225)  acetaminophen  (TYLENOL ) tablet 650 mg (has no administration in time range)  ondansetron  (ZOFRAN ) injection 4 mg (4 mg Intravenous Given 12/25/23 0748)  insulin  aspart (novoLOG ) injection 0-15 Units ( Subcutaneous Not Given 12/25/23 0802)  insulin  aspart (novoLOG ) injection 0-5 Units ( Subcutaneous Not Given 12/24/23 2205)  loratadine  (CLARITIN ) tablet 10 mg (10 mg  Oral Given 12/25/23 0748)  insulin  glargine-yfgn (SEMGLEE ) injection 40 Units (40 Units Subcutaneous Not Given 12/24/23 2215)  morphine  (PF) 4 MG/ML injection 4 mg (4 mg Intravenous Given 12/25/23 1051)  aspirin  chewable tablet 324 mg (324 mg Oral Given 12/25/23 1004)  nitroGLYCERIN  (NITROGLYN) 2 % ointment 1 inch (1 inch Topical Given 12/24/23 1601)  aspirin  chewable tablet 324 mg (324 mg Oral Given 12/24/23 1552)  heparin  bolus via infusion 4,000 Units (4,000 Units Intravenous Bolus from Bag 12/24/23 1838)    Mobility walks     Focused Assessments    R Recommendations: See Admitting Provider Note  Report given to:   Additional Notes:

## 2023-12-26 ENCOUNTER — Inpatient Hospital Stay (HOSPITAL_COMMUNITY)

## 2023-12-26 ENCOUNTER — Other Ambulatory Visit: Payer: Self-pay | Admitting: Physician Assistant

## 2023-12-26 ENCOUNTER — Ambulatory Visit: Admitting: Neurology

## 2023-12-26 ENCOUNTER — Encounter (HOSPITAL_COMMUNITY): Payer: Self-pay | Admitting: Family Medicine

## 2023-12-26 DIAGNOSIS — I259 Chronic ischemic heart disease, unspecified: Secondary | ICD-10-CM | POA: Diagnosis not present

## 2023-12-26 DIAGNOSIS — R079 Chest pain, unspecified: Secondary | ICD-10-CM | POA: Diagnosis not present

## 2023-12-26 DIAGNOSIS — R0789 Other chest pain: Secondary | ICD-10-CM | POA: Diagnosis not present

## 2023-12-26 DIAGNOSIS — I10 Essential (primary) hypertension: Secondary | ICD-10-CM | POA: Diagnosis not present

## 2023-12-26 LAB — NM MYOCAR MULTI W/SPECT W/WALL MOTION / EF
Base ST Depression (mm): 0 mm
LV dias vol: 70 mL (ref 46–106)
LV sys vol: 23 mL (ref 3.8–5.2)
MPHR: 155 {beats}/min
Nuc Stress EF: 68 %
Peak HR: 97 {beats}/min
Percent HR: 62 %
RATE: 0.3
Rest HR: 69 {beats}/min
Rest Nuclear Isotope Dose: 11 mCi
SDS: 4
SRS: 0
SSS: 4
ST Depression (mm): 0 mm
Stress Nuclear Isotope Dose: 33 mCi
TID: 1.15

## 2023-12-26 LAB — D-DIMER, QUANTITATIVE: D-Dimer, Quant: 3.08 ug{FEU}/mL — ABNORMAL HIGH (ref 0.00–0.50)

## 2023-12-26 LAB — GLUCOSE, CAPILLARY
Glucose-Capillary: 112 mg/dL — ABNORMAL HIGH (ref 70–99)
Glucose-Capillary: 109 mg/dL — ABNORMAL HIGH (ref 70–99)

## 2023-12-26 LAB — CBC
HCT: 43.6 % (ref 36.0–46.0)
Hemoglobin: 13.4 g/dL (ref 12.0–15.0)
MCH: 28.9 pg (ref 26.0–34.0)
MCHC: 30.7 g/dL (ref 30.0–36.0)
MCV: 94 fL (ref 80.0–100.0)
Platelets: 177 K/uL (ref 150–400)
RBC: 4.64 MIL/uL (ref 3.87–5.11)
RDW: 12.9 % (ref 11.5–15.5)
WBC: 7.9 K/uL (ref 4.0–10.5)
nRBC: 0 % (ref 0.0–0.2)

## 2023-12-26 LAB — LIPID PANEL
Cholesterol: 147 mg/dL (ref 0–200)
HDL: 48 mg/dL (ref >40)
LDL Cholesterol: 78 mg/dL (ref 0–99)
Total CHOL/HDL Ratio: 3.1 ratio
Triglycerides: 107 mg/dL (ref <150)
VLDL: 21 mg/dL (ref 0–40)

## 2023-12-26 LAB — HEPARIN LEVEL (UNFRACTIONATED): Heparin Unfractionated: 0.26 [IU]/mL — ABNORMAL LOW (ref 0.30–0.70)

## 2023-12-26 MED ORDER — REGADENOSON 0.4 MG/5ML IV SOLN
INTRAVENOUS | Status: AC
  Administered 2023-12-26: 0.4 mg via INTRAVENOUS
  Filled 2023-12-26: qty 5

## 2023-12-26 MED ORDER — SODIUM CHLORIDE FLUSH 0.9 % IV SOLN
INTRAVENOUS | Status: AC
  Filled 2023-12-26: qty 10

## 2023-12-26 MED ORDER — ALUM & MAG HYDROXIDE-SIMETH 200-200-20 MG/5ML PO SUSP: 15.0000 mL | ORAL | Status: DC | PRN

## 2023-12-26 MED ORDER — TECHNETIUM TC 99M TETROFOSMIN IV KIT
30.0000 | PACK | Freq: Once | INTRAVENOUS | Status: AC | PRN
  Administered 2023-12-26: 33 via INTRAVENOUS

## 2023-12-26 MED ORDER — ALUM & MAG HYDROXIDE-SIMETH 200-200-20 MG/5ML PO SUSP: 15.0000 mL | ORAL | 0 refills | Status: AC | PRN

## 2023-12-26 MED ORDER — IOHEXOL 350 MG/ML SOLN
75.0000 mL | Freq: Once | INTRAVENOUS | Status: AC | PRN
  Administered 2023-12-26: 75 mL via INTRAVENOUS

## 2023-12-26 MED ORDER — TECHNETIUM TC 99M TETROFOSMIN IV KIT
10.0000 | PACK | Freq: Once | INTRAVENOUS | Status: AC | PRN
  Administered 2023-12-26: 11 via INTRAVENOUS

## 2023-12-26 NOTE — Consult Note (Signed)
 Pharmacy Consult Note - Anticoagulation  Pharmacy Consult for heparin  Indication: chest pain/ACS  PATIENT MEASUREMENTS: Height: 5' 3 (160 cm) Weight: 94.8 kg (208 lb 15.9 oz) IBW/kg (Calculated) : 52.4 HEPARIN  DW (KG): 74.7  VITAL SIGNS: Temp: 97.8 F (36.6 C) (07/15 0452) Temp Source: Oral (07/15 0452) BP: 106/60 (07/15 0452) Pulse Rate: 72 (07/15 0452)  Recent Labs    12/24/23 1522 12/24/23 1718 12/24/23 1724 12/24/23 2355 12/25/23 0449 12/25/23 0709 12/26/23 0445  HGB 13.9  --   --   --   --    < > 13.4  HCT 42.5  --   --   --   --    < > 43.6  PLT 179  --   --   --   --    < > 177  APTT  --   --  40*  --   --   --   --   LABPROT  --   --  13.4  --   --   --   --   INR  --   --  1.0  --   --   --   --   HEPARINUNFRC  --   --   --    < >  --    < > 0.26*  CREATININE 0.76  --   --   --   --   --   --   TROPONINIHS 4   < >  --    < > 3  --   --    < > = values in this interval not displayed.    Estimated Creatinine Clearance: 76.8 mL/min (by C-G formula based on SCr of 0.76 mg/dL).  PAST MEDICAL HISTORY: Past Medical History:  Diagnosis Date   Anxiety    Asthma    Chronic pain    Depression    Diabetes mellitus without complication (HCC)    Fibromyalgia    GERD (gastroesophageal reflux disease)    Headache    migraines   Hyperlipidemia    Hypothyroidism    Osteoarthritis    PONV (postoperative nausea and vomiting)     ASSESSMENT: 68 y.o. female with PMH including GIB, IDA, HLD, pressure ulcers is presenting with intermittent now worsening chest pain, along with nausea and shortness of breath, unrelieved by two tabs of nitroglycerin  SL. cTn unremarkable, but there is concern for ACS. Patient is not on chronic anticoagulation per chart review. Pharmacy has been consulted to initiate and manage heparin  intravenous infusion.   HL 0.26- slightly subtherapeutic CBC WNL  Goal(s) of therapy: Heparin  level 0.3 - 0.7 units/mL Monitor platelets by  anticoagulation protocol: Yes   Baseline anticoagulation labs: Recent Labs    12/24/23 1522 12/24/23 1724 12/25/23 0709 12/26/23 0445  APTT  --  40*  --   --   INR  --  1.0  --   --   HGB 13.9  --  13.5 13.4  PLT 179  --  179 177       PLAN: Increase heparin  infusion at 1300 units/hr Heparin  level iin 6 hours and daily Continue to monitor H&H and platelets.   Elspeth Sour, PharmD Clinical Pharmacist 12/26/2023 7:56 AM

## 2023-12-26 NOTE — Discharge Summary (Addendum)
 Physician Discharge Summary   Patient: [name] MRN: [id number] DOB: 25-Sep-1955  Admit date:     12/24/2023  Discharge date: 12/26/23  Discharge Physician: Adriana DELENA Grams   PCP: Lennie Karna ORN, FNP   Recommendations at discharge:    Follow-up with PCP in 1 week May need referral to gastroenterologist for evaluation Chest pain noncardiac in nature, all stress test evaluation has been negative Continue current medication including Protonix  and Maalox  Discharge Diagnoses: Principal Problem:   Chest pain Active Problems:   HTN (hypertension)   Hyperlipidemia   DM2 (diabetes mellitus, type 2) (HCC)   Fibromyalgia   Hypothyroidism   Asthma   Hospital Course:  [name] is a 68 y.o. 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  She is seen for left substernal chest pain that started yesterday and was initially intermittent, lasting for 10 to 20 minutes then resolving.  At the time, chest pain was nonexertional.  Today, the pain started around 10 AM, tried to be more intense.  It radiated into her left arm and made her feel nauseated.  Her pain did worsen with exertion.  In addition she will get short of breath.  She took one of her husbands nitroglycerin , which helped the pain to go away, but it came back after a while.  She does have a history of a chemical stress test over 5 years ago, which was negative.    Chest pain - Substernal chest pain with radiation to the left shoulder, denies any shortness of breath, not diaphoretic -On heparin  drip--discontinued -Troponin 4, 4, 3 -No obvious EKG changes -Pending cardiology evaluation-Dr. Alvan following -S/p echo, nuclear stress test, all within normal limits Per cardiology chest pain not cardiac related  Elevated D-dimer 3.08 follow-up CTA negative: No pulmonary embolism identified. No acute findings.   Possible GERD  -  Continue Protonix  40 mg twice daily, Maalox as needed Follow-up with gastroenterologist as an patient  hypertension - Continue antihypertensives  Diabetes -Resume home regimen,   - Strict carb modified diabetic diet  Hypothyroidism Continue levothyroxine   Hyperlipidemia - Continue high-dose rosuvastatin     Consultants: Cardiology Procedures performed: Echo/nuclear cardiac stress test Disposition: Home Diet recommendation:  Discharge Diet Orders (From admission, onward)     Start     Ordered   12/26/23 0000  Diet - low sodium heart healthy        12/26/23 1105           Carb modified diet DISCHARGE MEDICATION: Allergies as of 12/26/2023       Reactions   Hydromorphone Shortness Of Breath, Other (See Comments)   Respiratory arrest per patient Coded with fast admin   Penicillins Anaphylaxis   Erythromycin Base Nausea And Vomiting   Liraglutide Other (See Comments)   Unknown    Meperidine  Hcl Other (See Comments)   Unknown    Nsaids Other (See Comments)   GI bleed   Ibuprofen Other (See Comments)   Per pt, it makes her bleed   Meperidine  Hives        Medication List     STOP taking these medications    vitamin C 1000 MG tablet   zinc gluconate 50 MG tablet       TAKE these medications    albuterol  108 (90 Base) MCG/ACT inhaler Commonly known as: VENTOLIN  HFA Inhale 1-2 puffs into the lungs every 4 (four) hours as needed for  shortness of breath or wheezing.   alum & mag hydroxide-simeth 200-200-20 MG/5ML suspension Commonly known as: MAALOX/MYLANTA Take 15 mLs by mouth every 4 (four) hours as needed for indigestion or heartburn.   buPROPion  200 MG 12 hr tablet Commonly known as: WELLBUTRIN  SR Take 1 tablet (200 mg total) by mouth 2 (two) times daily.   busPIRone  10 MG tablet Commonly known as: BUSPAR  Take 1.5 tablets (15 mg total) by mouth 2 (two) times daily.   estradiol 0.5 MG tablet Commonly known as: ESTRACE Take 0.5 mg by mouth  daily.   ferrous gluconate  324 MG tablet Commonly known as: FERGON Take 324 mg by mouth daily with breakfast.   FLUoxetine  40 MG capsule Commonly known as: PROZAC  Take 1 capsule (40 mg total) by mouth daily. What changed: when to take this   furosemide  20 MG tablet Commonly known as: LASIX  Take 20 mg by mouth every Monday, Wednesday, and Friday.   gabapentin  600 MG tablet Commonly known as: NEURONTIN  Take 1,200 mg by mouth 2 (two) times daily.   insulin  aspart 100 UNIT/ML injection Commonly known as: novoLOG  Inject 5-8 Units into the skin 3 (three) times daily before meals.   lamoTRIgine  200 MG tablet Commonly known as: LaMICtal  Take one tablet at bedtime. What changed:  how much to take how to take this when to take this   Lantus  SoloStar 100 UNIT/ML Solostar Pen Generic drug: insulin  glargine Inject 48 Units into the skin at bedtime.   levocetirizine 5 MG tablet Commonly known as: XYZAL  Take 5 mg by mouth every evening.   levothyroxine  200 MCG tablet Commonly known as: SYNTHROID  Take 200 mcg by mouth daily before breakfast.   LORazepam  0.5 MG tablet Commonly known as: ATIVAN  Take one to 2 tablets daily as needed for panic attacks. What changed:  how much to take how to take this when to take this reasons to take this   LOVASTATIN PO Take 80 mg by mouth at bedtime.   montelukast  10 MG tablet Commonly known as: SINGULAIR  Take 10 mg by mouth daily.   Mounjaro 7.5 MG/0.5ML Pen Generic drug: tirzepatide Inject 7.5 mg into the skin every Wednesday.   ondansetron  4 MG tablet Commonly known as: ZOFRAN  Take 1 tablet (4 mg total) by mouth every 8 (eight) hours as needed for refractory nausea / vomiting.   oxybutynin  5 MG tablet Commonly known as: DITROPAN  Take 5 mg by mouth every 8 (eight) hours as needed for bladder spasms.   oxyCODONE  15 MG immediate release tablet Commonly known as: ROXICODONE  Take 15 mg by mouth every 6 (six) hours as needed for  pain.   pantoprazole  40 MG tablet Commonly known as: PROTONIX  Take 1 tablet (40 mg total) by mouth 2 (two) times daily before a meal.   rizatriptan  10 MG tablet Commonly known as: MAXALT  Take 1 tablet (10 mg total) by mouth every 6 (six) hours as needed for migraine. May repeat in 2 hours if needed   traZODone  100 MG tablet Commonly known as: DESYREL  Take 1 tablet (100 mg total) by mouth at bedtime.   Vitamin D 125 MCG (5000 UT) Caps Take 2 capsules by mouth daily.        Discharge Exam: Filed Weights   12/24/23 1450 12/25/23 1226  Weight: 96.2 kg 94.8 kg        General:  AAO x 3,  cooperative, no distress;   HEENT:  Normocephalic, PERRL, otherwise with in Normal limits   Neuro:  CNII-XII intact. ,  normal motor and sensation, reflexes intact   Lungs:   Clear to auscultation BL, Respirations unlabored,  No wheezes / crackles  Cardio:    S1/S2, RRR, No murmure, No Rubs or Gallops   Abdomen:  Soft, non-tender, bowel sounds active all four quadrants, no guarding or peritoneal signs.  Muscular  skeletal:  Limited exam -global generalized weaknesses - in bed, able to move all 4 extremities,   2+ pulses,  symmetric, No pitting edema  Skin:  Dry, warm to touch, negative for any Rashes,  Wounds: Please see nursing documentation          Condition at discharge: good  The results of significant diagnostics from this hospitalization (including imaging, microbiology, ancillary and laboratory) are listed below for reference.   Imaging Studies: NM Myocar Multi W/Spect W/Wall Motion / EF Result Date: 12/26/2023   The study is normal. Anterior defect most likely secondary to differences in breast attenuation. Cannot completely exclude mild distal anterior ischmia. Either finding would support low risk.   No ST deviation was noted.   LV perfusion is normal.   Left ventricular function is normal. Nuclear stress EF: 68%. The left ventricular ejection fraction is hyperdynamic  (>65%). End diastolic cavity size is normal.   Small mild intensity distal anterior defect present in the stress images with normal wall motion. Most likely slight variation in breast attenuation artifact. Cannot completely exclude mild distal anterior ischemia.   ECHOCARDIOGRAM COMPLETE Result Date: 12/25/2023    ECHOCARDIOGRAM REPORT   Patient Name:   [name] Richart Date of Exam: 12/25/2023 Medical Rec #:  [id number]      Height:       63.0 in Accession #:    [id number]     Weight:       209.0 lb Date of Birth:  1955/08/10     BSA:          1.970 m Patient Age:    67 years       BP:           121/65 mmHg Patient Gender: F              HR:           66 bpm. Exam Location:  Inpatient Procedure: 2D Echo, Cardiac Doppler and Color Doppler (Both Spectral and Color            Flow Doppler were utilized during procedure). Indications:    Chest pain  History:        Patient has no prior history of Echocardiogram examinations.                 Risk Factors:Diabetes and Hypertension.  Sonographer:    Benard Stallion Referring Phys: [phone] JACOB J STINSON IMPRESSIONS  1. Left ventricular ejection fraction, by estimation, is 60 to 65%. The left ventricle has normal function. The left ventricle has no regional wall motion abnormalities. Left ventricular diastolic parameters are consistent with Grade I diastolic dysfunction (impaired relaxation).  2. Mild flattening of ventricular septum in systole suggesting elevated RV pressure. . Right ventricular systolic function is low normal. The right ventricular size is mild to moderately enlarged. There is normal pulmonary artery systolic pressure.  3. The mitral valve is normal in structure. No evidence of mitral valve regurgitation. No evidence of mitral stenosis.  4. The tricuspid valve is abnormal.  5. The aortic valve is tricuspid. Aortic valve regurgitation is not visualized. No aortic stenosis is present.  6. The inferior  vena cava is normal in size with greater than 50%  respiratory variability, suggesting right atrial pressure of 3 mmHg. FINDINGS  Left Ventricle: Left ventricular ejection fraction, by estimation, is 60 to 65%. The left ventricle has normal function. The left ventricle has no regional wall motion abnormalities. The left ventricular internal cavity size was normal in size. There is  no left ventricular hypertrophy. Left ventricular diastolic parameters are consistent with Grade I diastolic dysfunction (impaired relaxation). Right Ventricle: Mild flattening of ventricular septum in systole suggesting elevated RV pressure. The right ventricular size is mild to moderately enlarged. Right vetricular wall thickness was not well visualized. Right ventricular systolic function is low normal. There is normal pulmonary artery systolic pressure. The tricuspid regurgitant velocity is 2.53 m/s, and with an assumed right atrial pressure of 3 mmHg, the estimated right ventricular systolic pressure is 28.6 mmHg. Left Atrium: Left atrial size was normal in size. Right Atrium: Right atrial size was normal in size. Pericardium: There is no evidence of pericardial effusion. Mitral Valve: The mitral valve is normal in structure. No evidence of mitral valve regurgitation. No evidence of mitral valve stenosis. Tricuspid Valve: The tricuspid valve is abnormal. Tricuspid valve regurgitation is mild . No evidence of tricuspid stenosis. Aortic Valve: The aortic valve is tricuspid. Aortic valve regurgitation is not visualized. No aortic stenosis is present. Aortic valve mean gradient measures 5.0 mmHg. Aortic valve peak gradient measures 9.1 mmHg. Aortic valve area, by VTI measures 2.03 cm. Pulmonic Valve: The pulmonic valve was not well visualized. Pulmonic valve regurgitation is not visualized. No evidence of pulmonic stenosis. Aorta: The aortic root and ascending aorta are structurally normal, with no evidence of dilitation. Venous: The inferior vena cava is normal in size with greater  than 50% respiratory variability, suggesting right atrial pressure of 3 mmHg. IAS/Shunts: No atrial level shunt detected by color flow Doppler.  LEFT VENTRICLE PLAX 2D LVIDd:         5.00 cm   Diastology LVIDs:         3.45 cm   LV e' medial:    6.74 cm/s LV PW:         0.70 cm   LV E/e' medial:  8.7 LV IVS:        0.70 cm   LV e' lateral:   8.92 cm/s LVOT diam:     2.00 cm   LV E/e' lateral: 6.6 LV SV:         70 LV SV Index:   36 LVOT Area:     3.14 cm  RIGHT VENTRICLE RV Basal diam:  3.50 cm RV Mid diam:    3.20 cm RV S prime:     9.68 cm/s TAPSE (M-mode): 2.8 cm LEFT ATRIUM             Index        RIGHT ATRIUM           Index LA diam:        3.55 cm 1.80 cm/m   RA Area:     15.80 cm LA Vol (A2C):   45.4 ml 23.04 ml/m  RA Volume:   38.00 ml  19.29 ml/m LA Vol (A4C):   32.8 ml 16.65 ml/m LA Biplane Vol: 39.1 ml 19.85 ml/m  AORTIC VALVE AV Area (Vmax):    1.97 cm AV Area (Vmean):   1.92 cm AV Area (VTI):     2.03 cm AV Vmax:  151.00 cm/s AV Vmean:          103.000 cm/s AV VTI:            0.346 m AV Peak Grad:      9.1 mmHg AV Mean Grad:      5.0 mmHg LVOT Vmax:         94.90 cm/s LVOT Vmean:        62.900 cm/s LVOT VTI:          0.224 m LVOT/AV VTI ratio: 0.65  AORTA Ao Root diam: 2.40 cm Ao Asc diam:  3.10 cm MITRAL VALVE               TRICUSPID VALVE MV Area (PHT): 3.34 cm    TR Peak grad:   25.6 mmHg MV Decel Time: 227 msec    TR Vmax:        253.00 cm/s MR Peak grad: 13.1 mmHg MR Vmax:      181.00 cm/s  SHUNTS MV E velocity: 58.70 cm/s  Systemic VTI:  0.22 m MV A velocity: 80.20 cm/s  Systemic Diam: 2.00 cm MV E/A ratio:  0.73 Dorn Ross MD Electronically signed by Dorn Ross MD Signature Date/Time: 12/25/2023/3:58:39 PM    Final    DG Chest 2 View Result Date: 12/24/2023 CLINICAL DATA:  Chest pain EXAM: CHEST - 2 VIEW COMPARISON:  None Available. FINDINGS: The heart size and mediastinal contours are within normal limits. Mildly low lung volumes. No focal consolidations. No  pleural effusions. No pneumothorax. The visualized skeletal structures are unremarkable. IMPRESSION: No active cardiopulmonary disease. Electronically Signed   By: Michaeline Blanch M.D.   On: 12/24/2023 15:53    Microbiology: Results for orders placed or performed in visit on 05/17/23  WOUND CULTURE     Status: None   Collection Time: 05/17/23  3:17 PM   Specimen: Wound  Result Value Ref Range Status   MICRO NUMBER: [id number]  Final   SPECIMEN QUALITY: Adequate  Final   SOURCE: RT. BUTTOCK  Final   STATUS: FINAL  Final   GRAM STAIN:   Final    Rare epithelial cells Few White blood cells seen Rare Gram negative bacilli Rare Gram positive bacilli Rare Gram positive cocci in pairs   RESULT: Normal enteric flora  Final    Labs: CBC: Recent Labs  Lab 12/24/23 1522 12/25/23 0709 12/26/23 0445  WBC 8.3 6.6 7.9  HGB 13.9 13.5 13.4  HCT 42.5 43.0 43.6  MCV 91.6 92.9 94.0  PLT 179 179 177   Basic Metabolic Panel: Recent Labs  Lab 12/24/23 1522  NA 139  K 4.0  CL 101  CO2 28  GLUCOSE 71  BUN 14  CREATININE 0.76  CALCIUM  9.1   Liver Function Tests: No results for input(s): AST, ALT, ALKPHOS, BILITOT, PROT, ALBUMIN in the last 168 hours. CBG: Recent Labs  Lab 12/25/23 0936 12/25/23 1200 12/25/23 1615 12/25/23 2037 12/26/23 0731  GLUCAP 92 86 102* 161* 112*    Discharge time spent: greater than 30 minutes.  Signed: Adriana DELENA Grams, MD [facility] 12/26/2023

## 2023-12-26 NOTE — Progress Notes (Addendum)
 Rounding Note   Patient Name: [name] Date of Encounter: 12/26/2023  [facility] Cardiologist: None   Subjective Report ongoing similar constant chest pain described as heaviness/squeezing less severe, 6/10, radiating to left arms. Associated with SOB and nausea. She has been able to walk to restroom without any change in symptoms.   Scheduled Meds:  aspirin   324 mg Oral Daily   buPROPion   200 mg Oral BID   busPIRone   15 mg Oral BID   FLUoxetine   40 mg Oral QHS   gabapentin   1,200 mg Oral BID   insulin  aspart  0-15 Units Subcutaneous TID WC   insulin  aspart  0-5 Units Subcutaneous QHS   insulin  glargine-yfgn  40 Units Subcutaneous QHS   lamoTRIgine   200 mg Oral QHS   levothyroxine   200 mcg Oral QAC breakfast   loratadine   10 mg Oral Daily   montelukast   10 mg Oral Daily   pantoprazole   40 mg Oral BID AC   pravastatin   80 mg Oral q1800   traZODone   100 mg Oral QHS   Continuous Infusions:  heparin  1,150 Units/hr (12/25/23 1208)   PRN Meds: acetaminophen , docusate sodium , LORazepam , morphine  injection, ondansetron  (ZOFRAN ) IV, oxybutynin , technetium tetrofosmin    Vital Signs  Vitals:   12/25/23 1226 12/25/23 1613 12/25/23 2035 12/26/23 0452  BP: 121/65 116/76 124/72 106/60  Pulse: 73 70 71 72  Resp: 16 20 20 20   Temp: 97.9 F (36.6 C) 98 F (36.7 C) 97.9 F (36.6 C) 97.8 F (36.6 C)  TempSrc: Oral Oral Oral Oral  SpO2: 97% 95% 97% 97%  Weight: 94.8 kg     Height: 5' 3 (1.6 m)      No intake or output data in the 24 hours ending 12/26/23 0751    12/25/2023   12:26 PM 12/24/2023    2:50 PM 11/22/2023    9:42 AM  Last 3 Weights  Weight (lbs) 208 lb 15.9 oz 212 lb 213 lb  Weight (kg) 94.8 kg 96.163 kg 96.616 kg      Telemetry Did not review since off unit and interviewed during lexiscan  stress test - Personally Reviewed  ECG  NSR, HR 69, chronic RBBB  - Personally Reviewed  Physical Exam GEN: No acute distress.   Neck: No JVD Cardiac:  RRR, no murmurs, rubs, or gallops. Reproducible chest pain.  Respiratory: Clear to auscultation bilaterally. GI: Soft, nontender, non-distended  MS: No edema; No deformity. Neuro:  Nonfocal  Psych: Normal affect   Labs High Sensitivity Troponin:   Recent Labs  Lab 12/24/23 1522 12/24/23 1718 12/25/23 0046 12/25/23 0449  TROPONINIHS 4 4 4 3      Chemistry Recent Labs  Lab 12/24/23 1522  NA 139  K 4.0  CL 101  CO2 28  GLUCOSE 71  BUN 14  CREATININE 0.76  CALCIUM  9.1  GFRNONAA >60  ANIONGAP 10    Lipids  Recent Labs  Lab 12/26/23 0445  CHOL 147  TRIG 107  HDL 48  LDLCALC 78  CHOLHDL 3.1    Hematology Recent Labs  Lab 12/24/23 1522 12/25/23 0709 12/26/23 0445  WBC 8.3 6.6 7.9  RBC 4.64 4.63 4.64  HGB 13.9 13.5 13.4  HCT 42.5 43.0 43.6  MCV 91.6 92.9 94.0  MCH 30.0 29.2 28.9  MCHC 32.7 31.4 30.7  RDW 12.7 13.1 12.9  PLT 179 179 177   Thyroid No results for input(s): TSH, FREET4 in the last 168 hours.  BNPNo results for input(s): BNP,  PROBNP in the last 168 hours.  DDimer No results for input(s): DDIMER in the last 168 hours.   Radiology  ECHOCARDIOGRAM COMPLETE Result Date: 12/25/2023    ECHOCARDIOGRAM REPORT   Patient Name:   [name] Date of Exam: 12/25/2023 Medical Rec #:  [id number]      Height:       63.0 in Accession #:    [id number]     Weight:       209.0 lb Date of Birth:  1955-07-17     BSA:          1.970 m Patient Age:    67 years       BP:           121/65 mmHg Patient Gender: F              HR:           66 bpm. Exam Location:  Inpatient Procedure: 2D Echo, Cardiac Doppler and Color Doppler (Both Spectral and Color            Flow Doppler were utilized during procedure). Indications:    Chest pain  History:        Patient has no prior history of Echocardiogram examinations.                 Risk Factors:Diabetes and Hypertension.  Sonographer:    Benard Stallion Referring Phys: [phone] JACOB J STINSON IMPRESSIONS  1. Left  ventricular ejection fraction, by estimation, is 60 to 65%. The left ventricle has normal function. The left ventricle has no regional wall motion abnormalities. Left ventricular diastolic parameters are consistent with Grade I diastolic dysfunction (impaired relaxation).  2. Mild flattening of ventricular septum in systole suggesting elevated RV pressure. . Right ventricular systolic function is low normal. The right ventricular size is mild to moderately enlarged. There is normal pulmonary artery systolic pressure.  3. The mitral valve is normal in structure. No evidence of mitral valve regurgitation. No evidence of mitral stenosis.  4. The tricuspid valve is abnormal.  5. The aortic valve is tricuspid. Aortic valve regurgitation is not visualized. No aortic stenosis is present.  6. The inferior vena cava is normal in size with greater than 50% respiratory variability, suggesting right atrial pressure of 3 mmHg. FINDINGS  Left Ventricle: Left ventricular ejection fraction, by estimation, is 60 to 65%. The left ventricle has normal function. The left ventricle has no regional wall motion abnormalities. The left ventricular internal cavity size was normal in size. There is  no left ventricular hypertrophy. Left ventricular diastolic parameters are consistent with Grade I diastolic dysfunction (impaired relaxation). Right Ventricle: Mild flattening of ventricular septum in systole suggesting elevated RV pressure. The right ventricular size is mild to moderately enlarged. Right vetricular wall thickness was not well visualized. Right ventricular systolic function is low normal. There is normal pulmonary artery systolic pressure. The tricuspid regurgitant velocity is 2.53 m/s, and with an assumed right atrial pressure of 3 mmHg, the estimated right ventricular systolic pressure is 28.6 mmHg. Left Atrium: Left atrial size was normal in size. Right Atrium: Right atrial size was normal in size. Pericardium: There is no  evidence of pericardial effusion. Mitral Valve: The mitral valve is normal in structure. No evidence of mitral valve regurgitation. No evidence of mitral valve stenosis. Tricuspid Valve: The tricuspid valve is abnormal. Tricuspid valve regurgitation is mild . No evidence of tricuspid stenosis. Aortic Valve: The aortic valve is tricuspid. Aortic valve regurgitation  is not visualized. No aortic stenosis is present. Aortic valve mean gradient measures 5.0 mmHg. Aortic valve peak gradient measures 9.1 mmHg. Aortic valve area, by VTI measures 2.03 cm. Pulmonic Valve: The pulmonic valve was not well visualized. Pulmonic valve regurgitation is not visualized. No evidence of pulmonic stenosis. Aorta: The aortic root and ascending aorta are structurally normal, with no evidence of dilitation. Venous: The inferior vena cava is normal in size with greater than 50% respiratory variability, suggesting right atrial pressure of 3 mmHg. IAS/Shunts: No atrial level shunt detected by color flow Doppler.  LEFT VENTRICLE PLAX 2D LVIDd:         5.00 cm   Diastology LVIDs:         3.45 cm   LV e' medial:    6.74 cm/s LV PW:         0.70 cm   LV E/e' medial:  8.7 LV IVS:        0.70 cm   LV e' lateral:   8.92 cm/s LVOT diam:     2.00 cm   LV E/e' lateral: 6.6 LV SV:         70 LV SV Index:   36 LVOT Area:     3.14 cm  RIGHT VENTRICLE RV Basal diam:  3.50 cm RV Mid diam:    3.20 cm RV S prime:     9.68 cm/s TAPSE (M-mode): 2.8 cm LEFT ATRIUM             Index        RIGHT ATRIUM           Index LA diam:        3.55 cm 1.80 cm/m   RA Area:     15.80 cm LA Vol (A2C):   45.4 ml 23.04 ml/m  RA Volume:   38.00 ml  19.29 ml/m LA Vol (A4C):   32.8 ml 16.65 ml/m LA Biplane Vol: 39.1 ml 19.85 ml/m  AORTIC VALVE AV Area (Vmax):    1.97 cm AV Area (Vmean):   1.92 cm AV Area (VTI):     2.03 cm AV Vmax:           151.00 cm/s AV Vmean:          103.000 cm/s AV VTI:            0.346 m AV Peak Grad:      9.1 mmHg AV Mean Grad:      5.0 mmHg  LVOT Vmax:         94.90 cm/s LVOT Vmean:        62.900 cm/s LVOT VTI:          0.224 m LVOT/AV VTI ratio: 0.65  AORTA Ao Root diam: 2.40 cm Ao Asc diam:  3.10 cm MITRAL VALVE               TRICUSPID VALVE MV Area (PHT): 3.34 cm    TR Peak grad:   25.6 mmHg MV Decel Time: 227 msec    TR Vmax:        253.00 cm/s MR Peak grad: 13.1 mmHg MR Vmax:      181.00 cm/s  SHUNTS MV E velocity: 58.70 cm/s  Systemic VTI:  0.22 m MV A velocity: 80.20 cm/s  Systemic Diam: 2.00 cm MV E/A ratio:  0.73 Dorn Ross MD Electronically signed by Dorn Ross MD Signature Date/Time: 12/25/2023/3:58:39 PM    Final    DG Chest 2 View Result  Date: 12/24/2023 CLINICAL DATA:  Chest pain EXAM: CHEST - 2 VIEW COMPARISON:  None Available. FINDINGS: The heart size and mediastinal contours are within normal limits. Mildly low lung volumes. No focal consolidations. No pleural effusions. No pneumothorax. The visualized skeletal structures are unremarkable. IMPRESSION: No active cardiopulmonary disease. Electronically Signed   By: Michaeline Blanch M.D.   On: 12/24/2023 15:53    Cardiac Studies ECHO 12/25/2023 IMPRESSIONS   1. Left ventricular ejection fraction, by estimation, is 60 to 65%. The  left ventricle has normal function. The left ventricle has no regional  wall motion abnormalities. Left ventricular diastolic parameters are  consistent with Grade I diastolic  dysfunction (impaired relaxation).   2. Mild flattening of ventricular septum in systole suggesting elevated  RV pressure. . Right ventricular systolic function is low normal. The  right ventricular size is mild to moderately enlarged. There is normal  pulmonary artery systolic pressure.   3. The mitral valve is normal in structure. No evidence of mitral valve  regurgitation. No evidence of mitral stenosis.   4. The tricuspid valve is abnormal.   5. The aortic valve is tricuspid. Aortic valve regurgitation is not  visualized. No aortic stenosis is present.   6.  The inferior vena cava is normal in size with greater than 50%  respiratory variability, suggesting right atrial pressure of 3 mmHg.   Patient Profile   68 y.o. female with a hx of DM, HTN, HLD, WPW s/p ablation in 1993, RBBB since 2019, hypothyroidism who is being seen 12/25/2023 for the evaluation of chest pain at the request of Dr. Willette.   Assessment & Plan  Chest pain  Cath in 2006 with no significant stenosis, normal coronary angiography and normal left ventricular wall motion.  Lexiscan  stress test at [facility] in 02/2017 showed normal perfusion with no defects/ischemia with EF 65%.   Echo in 08/2017 showed EF of 60%, normal LV size/wall motion, mild RA enlargement, PA pressure of 43 mmHg and no significant valvular abnormalities. Presents with typical and atypical chest pain features as above.  Reproducible chest pain in the midsternal on exam.  No relief with morphine  or NTG.  EKG showed NSR, HR 71, chronic RBBB with no acute ST changes (similar to previous).  Treated with ASA 324 mg, heparin  4000 U bolus X1, NTG paste X1, heparin  infusion, and morphine . Less concerned about ischemia considering negative troponins, EKG without acute changes, previous cardio workouts, and no tobacco use.  ECHO 12/25/2023: EF 60 to 65%, no RWMA, G1DD, mild flattening of ventricular septum suggesting elevated RV pressure, low normal RV systolic function, mild to moderately enlarged RV, mild TVR Not a candidate for cath at this time. She reported on her previous stress test she tried the exercise option but was converted over to Lexiscan . Patient would prefer Lexiscan  stress test if cath is not indicated. Inpatient Lexiscan  ordered for today.  Today, report ongoing similar constant chest pain described as heaviness/squeezing less severe, 6/10, radiating to left arms. Associated with SOB and nausea. She has been able to walk to restroom without any change in symptoms. BP is on the soft side as below.  Would avoid given NTG or Imdur at this time.  No changes observed on EKGs during Lexiscan . MD will further evaluate with imaging.  Continue on ASA 81 mg and heparin  gtt. Can d/c heparin  if lexiscan  is normal.    HTN BP appears on the soft side: 101/66 Continue to monitor   HLD Continue pravastatin   80 mg daily Managed by PCP    WPW s/p ablation in 1993 No known recurrences since ablation.  Denies any palpitations   RBBB  Per chart review, present since 2019   DM 02/2023 A1C 6.3   For questions or updates, please contact [facility] Please consult [email or web address] for contact info under     Signed, Lorette CINDERELLA Kapur, PA-C  12/26/2023, 7:51 AM     Attending Note  Patient seen and discussed with PA Kapur, I agree with her documentation  1.Chest pain - atypical primarily in duration, essentially 36 hours of constant chest pain.  - no objective evidence of ischemia by EKG or enzymes - echo LVEF 60-65%, no WMAs, grad I dd. Mild flattening of ventricular septum suggesting elevated RV pressure. Mild to moderately enlarged RV  - long history of chest pains - 05/1999 [facility] cath: trivial CAD - Jan 2005 cath at [facility]: minimal disease - 07/2004 [facility]: normal coronaries - 03/2012 cath [facility] normal coronaries - Lexiscan  Cardiolite stress test performed at [facility] on 03/07/2017 showed normal perfusion with no defects   -essentially 4 caths since 2000 without significant disease. Lexiscan  2018 and this admission are benign - nothing to support symptoms are cardiac at this time - would check Ddimer, if negative then proceed with CT PE. No plans for additional cardiac testing.  - PMH mentions history of chronic pain, fibromyalgia perhaps related.   We will sign off inpatient care.  No indication for outpatien cardiology follow up.    Dorn Ross MD

## 2023-12-26 NOTE — Progress Notes (Signed)
     Shaquasha B Morren presented for a Lexiscan  nuclear stress test today.  I Lorette CINDERELLA Kapur, PA-C, provided direct supervision and was present during the stress portion of the study today, which was completed without significant symptoms, immediate complications, or acute ST/T changes on ECG.  Stress imaging is pending at this time.  Preliminary ECG findings may be listed in the chart, but the stress test result will not be finalized until perfusion imaging is complete.  Lorette CINDERELLA Kapur, PA-C  12/26/2023, 8:33 AM

## 2023-12-26 NOTE — Plan of Care (Signed)
   Problem: Education: Goal: Knowledge of General Education information will improve Description: Including pain rating scale, medication(s)/side effects and non-pharmacologic comfort measures Outcome: Progressing   Problem: Clinical Measurements: Goal: Will remain free from infection Outcome: Progressing   Problem: Clinical Measurements: Goal: Diagnostic test results will improve Outcome: Progressing

## 2023-12-26 NOTE — Plan of Care (Signed)
  Problem: Education: Goal: Knowledge of General Education information will improve Description: Including pain rating scale, medication(s)/side effects and non-pharmacologic comfort measures Outcome: Adequate for Discharge   Problem: Health Behavior/Discharge Planning: Goal: Ability to manage health-related needs will improve Outcome: Adequate for Discharge   Problem: Clinical Measurements: Goal: Ability to maintain clinical measurements within normal limits will improve Outcome: Adequate for Discharge Goal: Will remain free from infection Outcome: Adequate for Discharge Goal: Diagnostic test results will improve Outcome: Adequate for Discharge Goal: Respiratory complications will improve Outcome: Adequate for Discharge Goal: Cardiovascular complication will be avoided Outcome: Adequate for Discharge   Problem: Activity: Goal: Risk for activity intolerance will decrease Outcome: Adequate for Discharge   Problem: Nutrition: Goal: Adequate nutrition will be maintained Outcome: Adequate for Discharge   Problem: Coping: Goal: Level of anxiety will decrease Outcome: Adequate for Discharge   Problem: Elimination: Goal: Will not experience complications related to bowel motility Outcome: Adequate for Discharge Goal: Will not experience complications related to urinary retention Outcome: Adequate for Discharge   Problem: Pain Managment: Goal: General experience of comfort will improve and/or be controlled Outcome: Adequate for Discharge   Problem: Safety: Goal: Ability to remain free from injury will improve Outcome: Adequate for Discharge   Problem: Skin Integrity: Goal: Risk for impaired skin integrity will decrease Outcome: Adequate for Discharge   Problem: Education: Goal: Ability to describe self-care measures that may prevent or decrease complications (Diabetes Survival Skills Education) will improve Outcome: Adequate for Discharge Goal: Individualized Educational  Video(s) Outcome: Adequate for Discharge   Problem: Coping: Goal: Ability to adjust to condition or change in health will improve Outcome: Adequate for Discharge   Problem: Fluid Volume: Goal: Ability to maintain a balanced intake and output will improve Outcome: Adequate for Discharge   Problem: Health Behavior/Discharge Planning: Goal: Ability to identify and utilize available resources and services will improve Outcome: Adequate for Discharge Goal: Ability to manage health-related needs will improve Outcome: Adequate for Discharge   Problem: Metabolic: Goal: Ability to maintain appropriate glucose levels will improve Outcome: Adequate for Discharge   Problem: Nutritional: Goal: Maintenance of adequate nutrition will improve Outcome: Adequate for Discharge Goal: Progress toward achieving an optimal weight will improve Outcome: Adequate for Discharge   Problem: Skin Integrity: Goal: Risk for impaired skin integrity will decrease Outcome: Adequate for Discharge   Problem: Tissue Perfusion: Goal: Adequacy of tissue perfusion will improve Outcome: Adequate for Discharge   Problem: Education: Goal: Understanding of cardiac disease, CV risk reduction, and recovery process will improve Outcome: Adequate for Discharge Goal: Individualized Educational Video(s) Outcome: Adequate for Discharge   Problem: Activity: Goal: Ability to tolerate increased activity will improve Outcome: Adequate for Discharge   Problem: Cardiac: Goal: Ability to achieve and maintain adequate cardiovascular perfusion will improve Outcome: Adequate for Discharge   Problem: Health Behavior/Discharge Planning: Goal: Ability to safely manage health-related needs after discharge will improve Outcome: Adequate for Discharge

## 2024-01-01 ENCOUNTER — Other Ambulatory Visit: Payer: Self-pay | Admitting: Gastroenterology

## 2024-01-01 ENCOUNTER — Telehealth: Admitting: Adult Health

## 2024-01-01 ENCOUNTER — Encounter: Payer: Self-pay | Admitting: Adult Health

## 2024-01-01 DIAGNOSIS — F410 Panic disorder [episodic paroxysmal anxiety] without agoraphobia: Secondary | ICD-10-CM | POA: Diagnosis not present

## 2024-01-01 DIAGNOSIS — F4310 Post-traumatic stress disorder, unspecified: Secondary | ICD-10-CM

## 2024-01-01 DIAGNOSIS — F411 Generalized anxiety disorder: Secondary | ICD-10-CM

## 2024-01-01 DIAGNOSIS — K219 Gastro-esophageal reflux disease without esophagitis: Secondary | ICD-10-CM

## 2024-01-01 DIAGNOSIS — F331 Major depressive disorder, recurrent, moderate: Secondary | ICD-10-CM | POA: Diagnosis not present

## 2024-01-01 DIAGNOSIS — G4700 Insomnia, unspecified: Secondary | ICD-10-CM

## 2024-01-01 MED ORDER — BUPROPION HCL ER (SR) 200 MG PO TB12: 200.0000 mg | ORAL_TABLET | Freq: Two times a day (BID) | ORAL | 1 refills | Status: AC

## 2024-01-01 MED ORDER — QUETIAPINE FUMARATE 50 MG PO TABS: 50.0000 mg | ORAL_TABLET | Freq: Every day | ORAL | 1 refills | Status: DC

## 2024-01-01 MED ORDER — LAMOTRIGINE 200 MG PO TABS: ORAL_TABLET | ORAL | 1 refills | Status: DC

## 2024-01-01 MED ORDER — QUETIAPINE FUMARATE 25 MG PO TABS: 25.0000 mg | ORAL_TABLET | Freq: Every day | ORAL | 1 refills | Status: DC

## 2024-01-01 MED ORDER — QUETIAPINE FUMARATE 25 MG PO TABS: 50.0000 mg | ORAL_TABLET | Freq: Every day | ORAL | 1 refills | Status: DC

## 2024-01-01 NOTE — Progress Notes (Signed)
 [name] [id number] [birth year] 68 y.o.  Virtual Visit via Video Note  I connected with pt @ on 01/01/24 at  3:30 PM EDT by a video enabled telemedicine application and verified that I am speaking with the correct person using two identifiers.   I discussed the limitations of evaluation and management by telemedicine and the availability of in person appointments. The patient expressed understanding and agreed to proceed.  I discussed the assessment and treatment plan with the patient. The patient was provided an opportunity to ask questions and all were answered. The patient agreed with the plan and demonstrated an understanding of the instructions.   The patient was advised to call back or seek an in-person evaluation if the symptoms worsen or if the condition fails to improve as anticipated.  I provided 25 minutes of non-face-to-face time during this encounter.  The patient was located at home.  The provider was located at [facility].   [name], NP   Subjective:   Patient ID:  [name] is a 68 y.o. (DOB 1956/05/01) female.  Chief Complaint: No chief complaint on file.   [name], [name], [name], insomnia and panic attacks.  Reports recent over night hospitalization due to chest pain. Collateral reviewed.  Describes mood today as not too good. Pleasant. Reports decreased tearfulness. Mood symptoms - reports depression, anxiety and irritability. Reports lower interest and motivation. Reports panic attacks - one twice a week. Reports increased worry, rumination and over thinking. Reports mild hand tremors. Reports mood is lower. Stating I don't feel like I'm doing too good. Reports medications are helpful. Taking medications as prescribed. Energy levels lower. Active, does not have a regular exercise routine with physical disabilities. Does not enjoys some usual interests and activities. Divorced from husband  of 40 years, but lives with ex-husband and his mother in law - 54 years old. Has a dog yorkie - [name]. Has 3 grown children. Spending time with family. Appetite adequate. Weight gain - 200 to 208 pounds - 65- taking Monjauro. Sleeps better some nights than others. Averages 8 to 9 hours. Reports focus and concentration not good. Completing tasks. Managing minimal aspects of household. 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  Denies AH or VH. Denies self harm. Denies substance abuse. Denies paranoia.   Chronic pain - working with pain management.  Seeing Dr. Ines - managing migraines  Previous medication trials: Cymbalta, Prozac , Rexulti , Vraylar ,   Review of Systems:  Review of Systems  Musculoskeletal:  Negative for gait problem.  Neurological:  Negative for tremors.  Psychiatric/Behavioral:         Please refer to HPI    Medications: I have reviewed the patient's current medications.  Current Outpatient Medications  Medication Sig Dispense Refill   albuterol  (VENTOLIN  HFA) 108 (90 Base) MCG/ACT inhaler Inhale 1-2 puffs into the lungs every 4 (four) hours as needed for shortness of breath or wheezing.     alum & mag hydroxide-simeth (MAALOX/MYLANTA) 200-200-20 MG/5ML suspension Take 15 mLs by mouth every 4 (four) hours as needed for indigestion or heartburn. 355 mL 0   buPROPion  (WELLBUTRIN  SR) 200 MG 12 hr tablet Take 1 tablet (200 mg total) by mouth 2 (two) times daily. 60 tablet 5   busPIRone  (BUSPAR ) 10 MG tablet Take 1.5 tablets (15 mg total) by mouth 2 (two) times daily. 60 tablet 5   Cholecalciferol (VITAMIN D) 125 MCG (  5000 UT) CAPS Take 2 capsules by mouth daily.     estradiol (ESTRACE) 0.5 MG tablet Take 0.5 mg by mouth daily.     ferrous gluconate  (FERGON) 324 MG tablet Take 324 mg by mouth daily with breakfast.     FLUoxetine  (PROZAC ) 40 MG capsule Take 1 capsule (40 mg total) by mouth daily. (Patient taking differently: Take 40 mg by  mouth at bedtime.) 90 capsule 1   furosemide  (LASIX ) 20 MG tablet Take 20 mg by mouth every Monday, Wednesday, and Friday.     gabapentin  (NEURONTIN ) 600 MG tablet Take 1,200 mg by mouth 2 (two) times daily.     insulin  aspart (NOVOLOG ) 100 UNIT/ML injection Inject 5-8 Units into the skin 3 (three) times daily before meals.     lamoTRIgine  (LAMICTAL ) 200 MG tablet Take one tablet at bedtime. (Patient taking differently: Take 200 mg by mouth at bedtime. Take one tablet at bedtime.) 30 tablet 5   LANTUS  SOLOSTAR 100 UNIT/ML Solostar Pen Inject 48 Units into the skin at bedtime.     levocetirizine (XYZAL ) 5 MG tablet Take 5 mg by mouth every evening.     levothyroxine  (SYNTHROID ) 200 MCG tablet Take 200 mcg by mouth daily before breakfast.     LORazepam  (ATIVAN ) 0.5 MG tablet Take one to 2 tablets daily as needed for panic attacks. (Patient taking differently: Take 0.5 mg by mouth daily as needed (panic attacks). Take one to 2 tablets daily as needed for panic attacks.) 60 tablet 2   LOVASTATIN PO Take 80 mg by mouth at bedtime.     montelukast  (SINGULAIR ) 10 MG tablet Take 10 mg by mouth daily.     MOUNJARO 7.5 MG/0.5ML Pen Inject 7.5 mg into the skin every Wednesday.     ondansetron  (ZOFRAN ) 4 MG tablet Take 1 tablet (4 mg total) by mouth every 8 (eight) hours as needed for refractory nausea / vomiting. 20 tablet 1   oxybutynin  (DITROPAN ) 5 MG tablet Take 5 mg by mouth every 8 (eight) hours as needed for bladder spasms.     oxyCODONE  (ROXICODONE ) 15 MG immediate release tablet Take 15 mg by mouth every 6 (six) hours as needed for pain.     pantoprazole  (PROTONIX ) 40 MG tablet Take 1 tablet (40 mg total) by mouth 2 (two) times daily before a meal. 60 tablet 3   rizatriptan  (MAXALT ) 10 MG tablet Take 1 tablet (10 mg total) by mouth every 6 (six) hours as needed for migraine. May repeat in 2 hours if needed 10 tablet 11   traZODone  (DESYREL ) 100 MG tablet Take 1 tablet (100 mg total) by mouth at  bedtime. 90 tablet 0   No current facility-administered medications for this visit.    Medication Side Effects: None  Allergies:  Allergies  Allergen Reactions   Hydromorphone Shortness Of Breath and Other (See Comments)    Respiratory arrest per patient Coded with fast admin   Penicillins Anaphylaxis   Erythromycin Base Nausea And Vomiting   Liraglutide Other (See Comments)    Unknown    Meperidine  Hcl Other (See Comments)    Unknown    Nsaids Other (See Comments)    GI bleed    Ibuprofen Other (See Comments)    Per pt, it makes her bleed   Meperidine  Hives    Past Medical History:  Diagnosis Date   Anxiety    Asthma    Chronic pain    Depression    Diabetes mellitus without complication (HCC)  Fibromyalgia    GERD (gastroesophageal reflux disease)    Headache    migraines   Hyperlipidemia    Hypothyroidism    Osteoarthritis    PONV (postoperative nausea and vomiting)     Family History  Problem Relation Age of Onset   Hypertension Mother    Heart attack Father        Late 93's   Stroke Father     Social History   Socioeconomic History   Marital status: Divorced    Spouse name: Not on file   Number of children: Not on file   Years of education: Not on file   Highest education level: Not on file  Occupational History   Not on file  Tobacco Use   Smoking status: Former    Types: Cigarettes    Passive exposure: Never   Smokeless tobacco: Never  Vaping Use   Vaping status: Never Used  Substance and Sexual Activity   Alcohol use: Never   Drug use: Never   Sexual activity: Not Currently    Birth control/protection: None  Other Topics Concern   Not on file  Social History Narrative   Caffiene 1-2 cups daily.   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    Social Drivers of Health   Financial Resource Strain: Patient Unable To Answer (10/24/2023)   Received from [facility]   Overall Financial Resource  Strain (CARDIA)    Difficulty of Paying Living Expenses: Patient unable to answer  Food Insecurity: No Food Insecurity (12/25/2023)   Hunger Vital Sign    Worried About Running Out of Food in the Last Year: Never true    Ran Out of Food in the Last Year: Never true  Transportation Needs: No Transportation Needs (12/25/2023)   PRAPARE - Administrator, Civil Service (Medical): No    Lack of Transportation (Non-Medical): No  Physical Activity: Patient Unable To Answer (10/24/2023)   Received from [facility]   Exercise Vital Sign    On average, how many days per week do you engage in moderate to strenuous exercise (like a brisk walk)?: Patient unable to answer    On average, how many minutes do you engage in exercise at this level?: Patient unable to answer  Stress: Patient Unable To Answer (10/24/2023)   Received from [facility] - Occupational Stress Questionnaire    Feeling of Stress : Patient unable to answer  Social Connections: Moderately Integrated (12/25/2023)   Social Connection and Isolation Panel    Frequency of Communication with Friends and Family: More than three times a week    Frequency of Social Gatherings with Friends and Family: More than three times a week    Attends Religious Services: More than 4 times per year    Active Member of [employer] or Organizations: Yes    Attends Banker Meetings: More than 4 times per year    Marital Status: Divorced  Intimate Partner Violence: Not At Risk (12/25/2023)   Humiliation, Afraid, Rape, and Kick questionnaire    Fear of Current or Ex-Partner: No    Emotionally Abused: No    Physically Abused: No    Sexually Abused: No    Past Medical History, Surgical history, Social history, and Family history were reviewed and updated as appropriate.   Please see review of systems for further details on the patient's review  from today.   Objective:   Physical Exam:   There were no vitals taken for this visit.  Physical Exam Constitutional:      General: She is not in acute distress. Musculoskeletal:        General: No deformity.  Neurological:     Mental Status: She is alert and oriented to person, place, and time.     Coordination: Coordination normal.  Psychiatric:        Attention and Perception: Attention and perception normal. She does not perceive auditory or visual hallucinations.        Mood and Affect: Mood normal. Mood is not anxious or depressed. Affect is not labile, blunt, angry or inappropriate.        Speech: Speech normal.        Behavior: Behavior normal.        Thought Content: Thought content normal. Thought content is not paranoid or delusional. Thought content does not include homicidal or suicidal ideation. Thought content does not include homicidal or suicidal plan.        Cognition and Memory: Cognition and memory normal.        Judgment: Judgment normal.     Comments: Insight intact     Lab Review:     Component Value Date/Time   NA 139 12/24/2023 1522   K 4.0 12/24/2023 1522   CL 101 12/24/2023 1522   CO2 28 12/24/2023 1522   GLUCOSE 71 12/24/2023 1522   BUN 14 12/24/2023 1522   CREATININE 0.76 12/24/2023 1522   CALCIUM  9.1 12/24/2023 1522   PROT 5.8 (L) 12/23/2021 0527   ALBUMIN 2.8 (L) 12/23/2021 0527   AST 16 12/23/2021 0527   ALT 15 12/23/2021 0527   ALKPHOS 54 12/23/2021 0527   BILITOT 0.6 12/23/2021 0527   GFRNONAA >60 12/24/2023 1522       Component Value Date/Time   WBC 7.9 12/26/2023 0445   RBC 4.64 12/26/2023 0445   HGB 13.4 12/26/2023 0445   HCT 43.6 12/26/2023 0445   PLT 177 12/26/2023 0445   MCV 94.0 12/26/2023 0445   MCH 28.9 12/26/2023 0445   MCHC 30.7 12/26/2023 0445   RDW 12.9 12/26/2023 0445   LYMPHSABS 1.5 10/20/2023 1257   MONOABS 0.6 10/20/2023 1257   EOSABS 0.2 10/20/2023 1257   BASOSABS 0.0 10/20/2023 1257    No results found for: POCLITH, LITHIUM   No results  found for: PHENYTOIN, PHENOBARB, VALPROATE, CBMZ   .res Assessment: Plan:    Plan:  PDMP reviewed  Seroquel  50mg  at hs - 25mg  at bedtime  Wellbutrin  SR 200mg  BID  Buspar  15mg  BID  Prozac  40mg  daily   Lamictal  200mg  daily  Trazadone 150mg  at bedtime Ativan  0.5mg  BID daily prn as needed   Plans to see neurologist 01/19/2024.  Consider Abilify 2mg  daily.  RTC 6 weeks  25 minutes spent dedicated to the care of this patient on the date of this encounter to include pre-visit review of records, ordering of medication, post visit documentation, and face-to-face time with the patient discussing MDD, [name], [name], insomnia and panic attacks. Discussed continuing current medication regimen.  Patient advised to contact office with any questions, adverse effects, or acute worsening in signs and symptoms.   Discussed potential benefits, risk, and side effects of benzodiazepines to include potential risk of tolerance and dependence, as well as possible drowsiness. Advised patient not to drive if experiencing drowsiness and to take lowest possible effective dose to minimize risk of dependence and tolerance.  Discussed potential metabolic side effects associated with atypical antipsychotics, as well as potential risk for movement side effects. Advised pt to contact office if movement side effects occur.    There are no diagnoses linked to this encounter.   Please see After Visit Summary for patient specific instructions.  Future Appointments  Date Time Provider Department Center  01/01/2024  3:30 PM Bethania Schlotzhauer, Angeline Mattocks, NP [facility] None  01/09/2024  9:45 AM Kallie Manuelita BROCKS, MD [facility] None  02/19/2024  2:00 PM Ines Onetha NOVAK, MD [facility] None    No orders of the defined types were placed in this encounter.     -------------------------------

## 2024-01-09 ENCOUNTER — Encounter: Payer: Self-pay | Admitting: General Surgery

## 2024-01-09 ENCOUNTER — Ambulatory Visit: Admitting: General Surgery

## 2024-01-09 VITALS — BP 117/76 | HR 71 | Temp 98.0°F | Resp 16 | Ht 63.0 in | Wt 210.0 lb

## 2024-01-09 DIAGNOSIS — L89310 Pressure ulcer of right buttock, unstageable: Secondary | ICD-10-CM

## 2024-01-09 NOTE — Patient Instructions (Signed)
 Follow up PRN

## 2024-01-09 NOTE — Progress Notes (Signed)
 [facility]  Doing well. No issues. No drainage. Some soreness at times.  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   Continue to monitor Follow up as needed Induration and soreness should continue to improve over next 6 months  Manuelita Pander, MD [facility] [address] [place], [place] [address] [phone] (office)

## 2024-01-15 IMAGING — DX DG SHOULDER 2+V*R*
3 series · 3 of 3 positions shown · non-contrast
Comparison: None.

CLINICAL DATA: Trauma, fall

EXAM:
RIGHT SHOULDER - 2+ VIEW

[shoulder internal rotation ap]
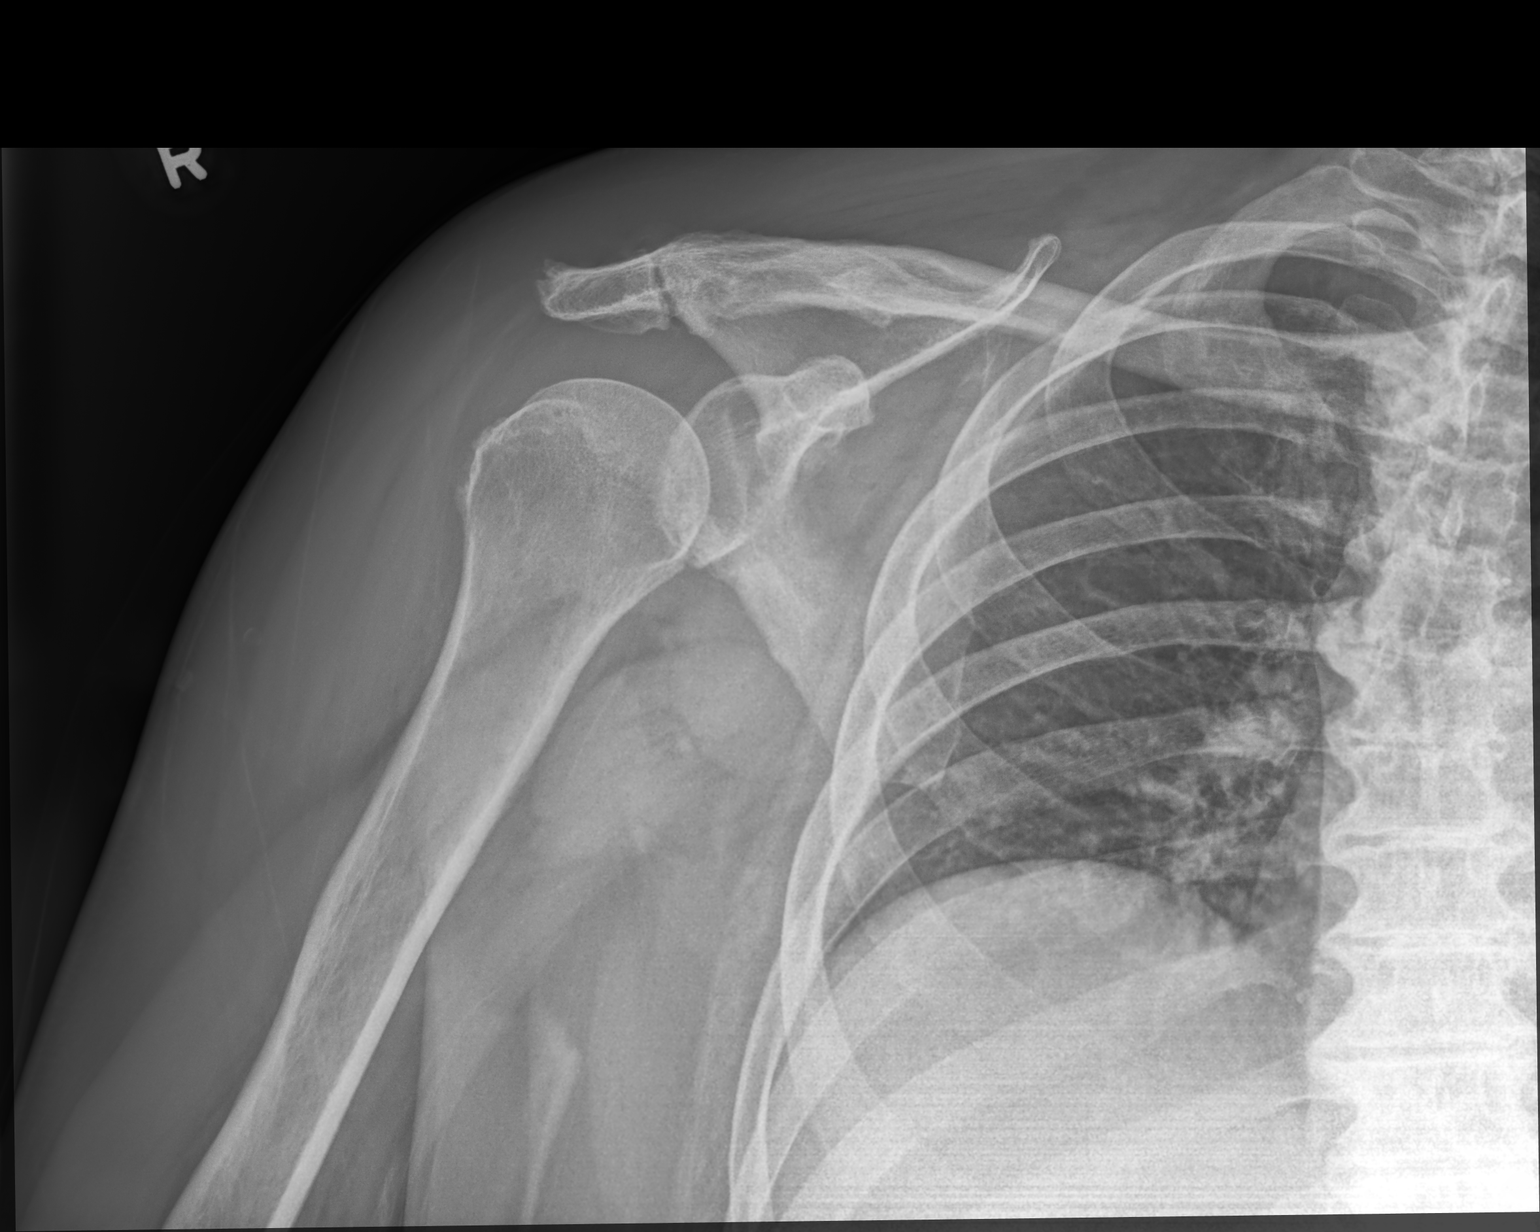

[shoulder external rotation ap]
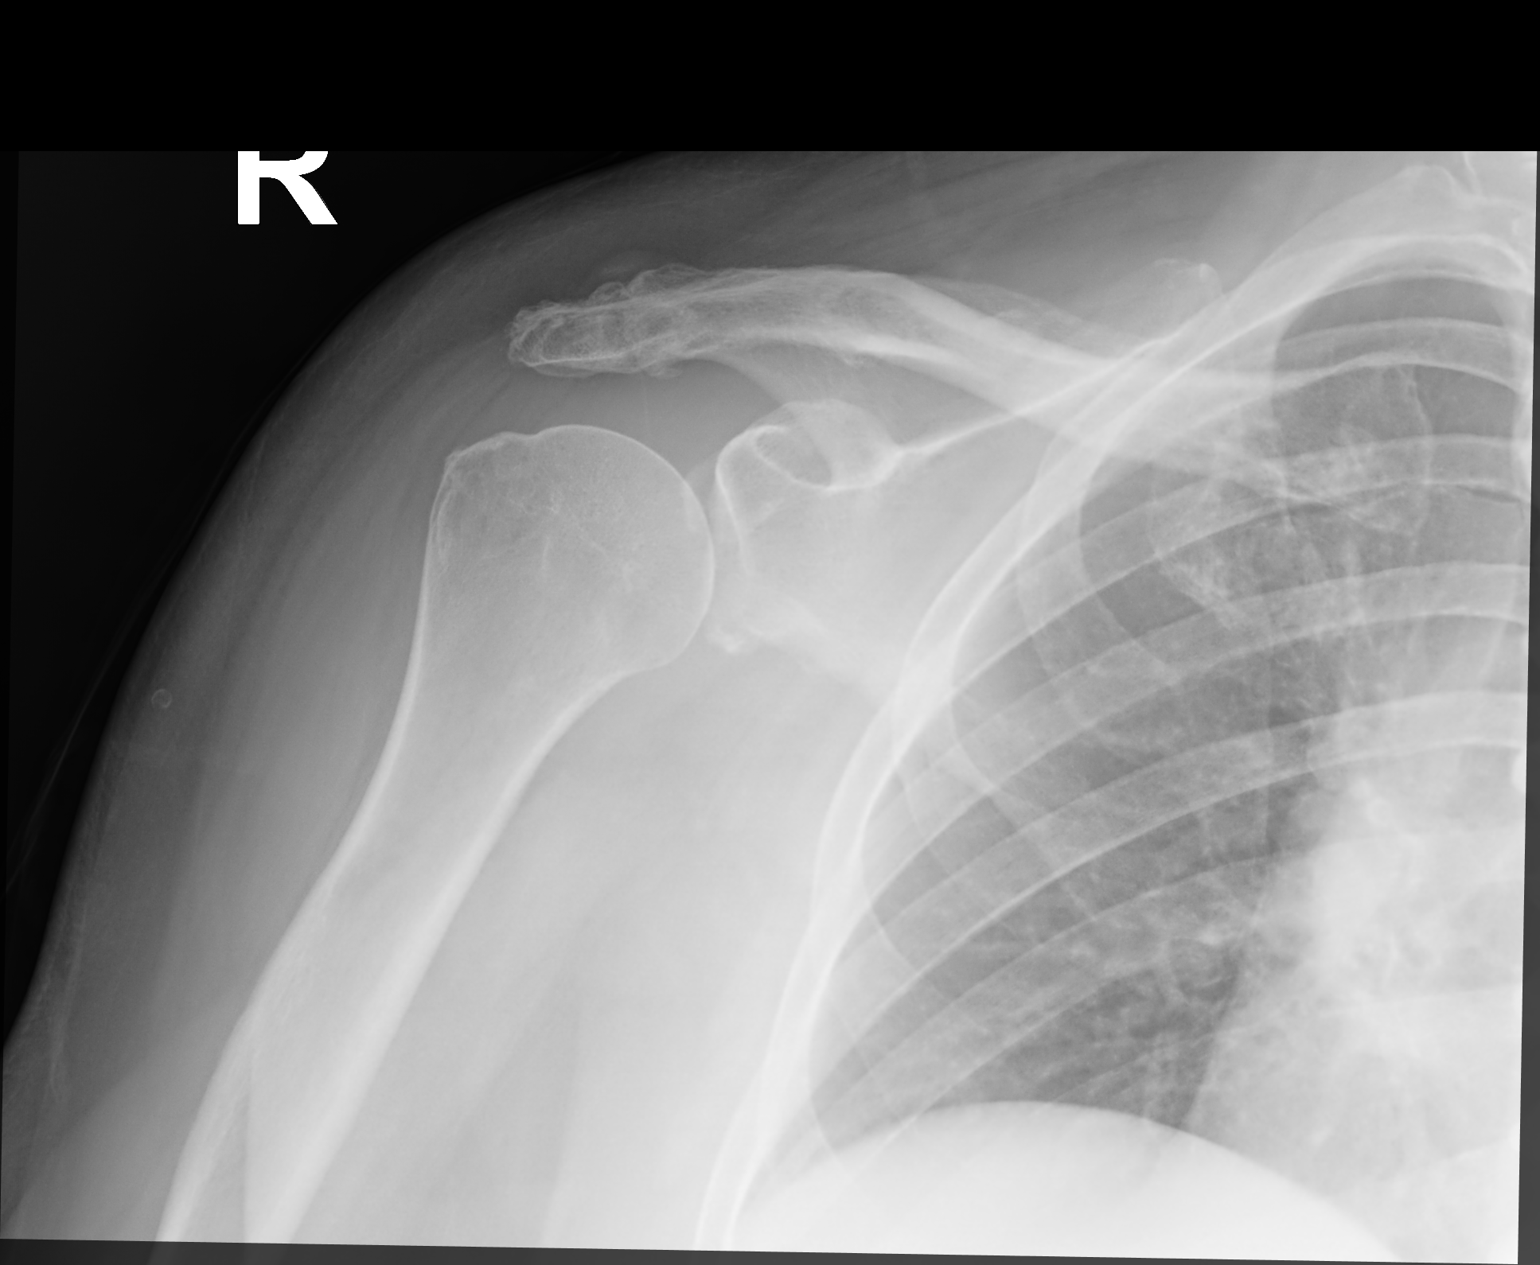

[shoulder (y view) lat]
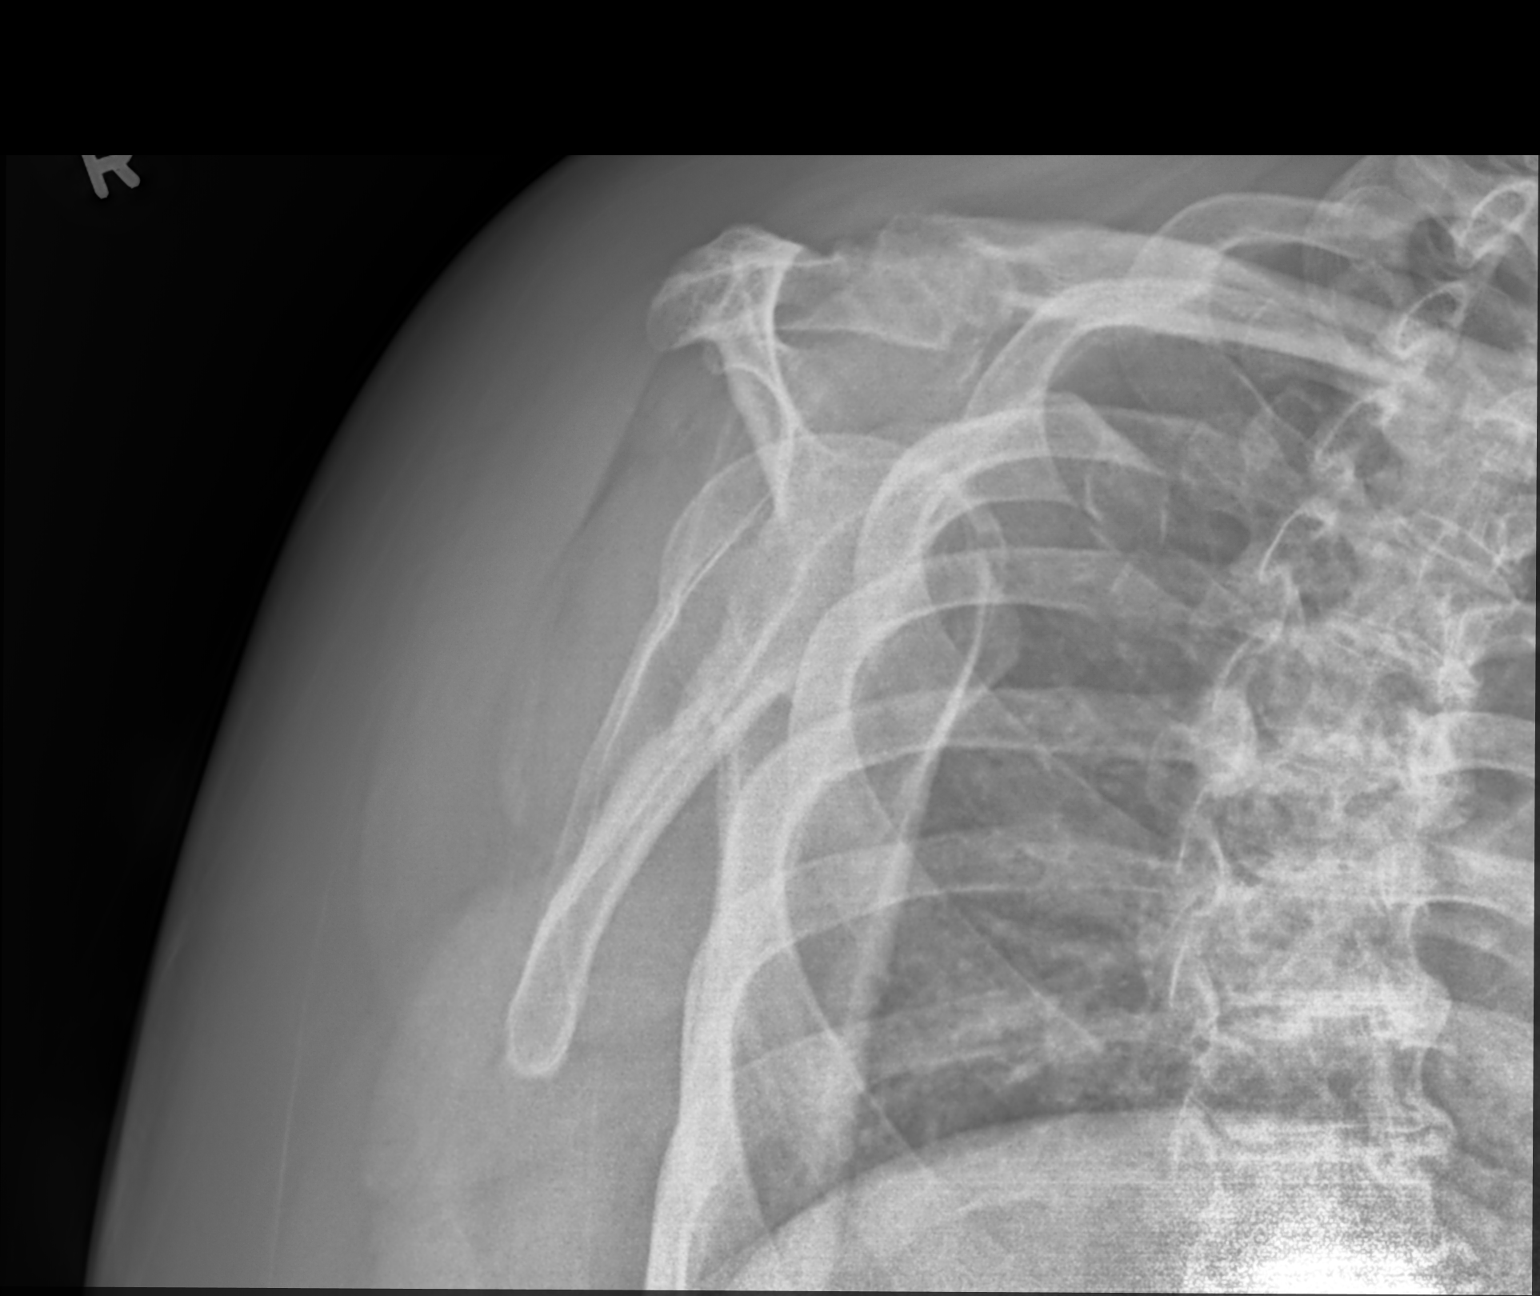

[3 of 3 positions shown; findings below may reference images not displayed]

FINDINGS: No fracture or dislocation is seen. Degenerative changes are noted
with bony spurs in the AC joint. Small bony spurs are noted in the
inferior aspect of glenoid.
IMPRESSION: No recent fracture or dislocation is seen in the right shoulder.

## 2024-02-13 ENCOUNTER — Telehealth (INDEPENDENT_AMBULATORY_CARE_PROVIDER_SITE_OTHER): Admitting: Adult Health

## 2024-02-13 ENCOUNTER — Encounter: Payer: Self-pay | Admitting: Adult Health

## 2024-02-13 DIAGNOSIS — F331 Major depressive disorder, recurrent, moderate: Secondary | ICD-10-CM

## 2024-02-13 DIAGNOSIS — F410 Panic disorder [episodic paroxysmal anxiety] without agoraphobia: Secondary | ICD-10-CM | POA: Diagnosis not present

## 2024-02-13 DIAGNOSIS — F4310 Post-traumatic stress disorder, unspecified: Secondary | ICD-10-CM

## 2024-02-13 DIAGNOSIS — F411 Generalized anxiety disorder: Secondary | ICD-10-CM

## 2024-02-13 DIAGNOSIS — G4700 Insomnia, unspecified: Secondary | ICD-10-CM

## 2024-02-13 MED ORDER — LAMOTRIGINE 25 MG PO TABS: ORAL_TABLET | ORAL | 2 refills | Status: DC

## 2024-02-13 NOTE — Progress Notes (Signed)
 [name] [id number] [birth year] 68 y.o.  Virtual Visit via Video Note  I connected with pt @ on 02/13/24 at  3:30 PM EDT by a video enabled telemedicine application and verified that I am speaking with the correct person using two identifiers.   I discussed the limitations of evaluation and management by telemedicine and the availability of in person appointments. The patient expressed understanding and agreed to proceed.  I discussed the assessment and treatment plan with the patient. The patient was provided an opportunity to ask questions and all were answered. The patient agreed with the plan and demonstrated an understanding of the instructions.   The patient was advised to call back or seek an in-person evaluation if the symptoms worsen or if the condition fails to improve as anticipated.  I provided 25 minutes of non-face-to-face time during this encounter.  The patient was located at home.  The provider was located at [facility].   [name], NP   Subjective:   Patient ID:  [name] is a 68 y.o. (DOB 1956/03/24) female.  Chief Complaint: No chief complaint on file.   [name], [name], [name], insomnia and panic attacks.  Reports recent over night hospitalization due to chest pain. Collateral reviewed.  Describes mood today as not too good. Pleasant. Reports decreased tearfulness. Mood symptoms - reports depression, anxiety and irritability. Reports lower interest and motivation. Reports panic attacks. Reports increased worry, rumination and over thinking. Reports mood is lower. Stating I don't feel like I'm doing too good. Reports medications are helpful. Taking medications as prescribed. Energy levels lower. Active, does not have a regular exercise routine with physical disabilities. Enjoys some usual interests and activities - seeing grandchildren. Note getting out as much - watching church online.  Divorced from husband of 40 years, but lives with ex-husband and his mother in law - 46 years old. Has a dog yorkie - [name]. Has 3 grown children. Spending time with family. Appetite adequate. Weight gain - 208 to 212 pounds - 63- taking Monjauro. Sleeps better some nights than others. Averages 8  hours. Reports focus and concentration about the same - so-so - could be better. Completing some tasks. Managing minimal aspects of household - it's hit or miss. 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  Denies AH or VH. Denies self harm. Denies substance abuse. Denies paranoia.   Chronic pain - working with pain management.  Seeing Dr. Ines - managing migraines  Previous medication trials: Cymbalta, Prozac , Rexulti , Vraylar ,    Review of Systems:  Review of Systems  Musculoskeletal:  Negative for gait problem.  Neurological:  Negative for tremors.  Psychiatric/Behavioral:         Please refer to HPI    Medications: I have reviewed the patient's current medications.  Current Outpatient Medications  Medication Sig Dispense Refill   lamoTRIgine  (LAMICTAL ) 25 MG tablet Take two tablets every morning. 60 tablet 2   albuterol  (VENTOLIN  HFA) 108 (90 Base) MCG/ACT inhaler Inhale 1-2 puffs into the lungs every 4 (four) hours as needed for shortness of breath or wheezing.     alum & mag hydroxide-simeth (MAALOX/MYLANTA) 200-200-20 MG/5ML suspension Take 15 mLs by mouth every 4 (four) hours as needed for indigestion or heartburn. 355 mL 0   buPROPion  (WELLBUTRIN  SR) 200 MG 12 hr tablet Take 1 tablet (200 mg total) by mouth 2 (two) times daily. 180 tablet 1  busPIRone  (BUSPAR ) 10 MG tablet Take 1.5 tablets (15 mg total) by mouth 2 (two) times daily. 60 tablet 5   Cholecalciferol (VITAMIN D) 125 MCG (5000 UT) CAPS Take 2 capsules by mouth daily.     estradiol (ESTRACE) 0.5 MG tablet Take 0.5 mg by mouth daily.     ferrous gluconate  (FERGON) 324 MG tablet Take  324 mg by mouth daily with breakfast.     FLUoxetine  (PROZAC ) 40 MG capsule Take 1 capsule (40 mg total) by mouth daily. (Patient taking differently: Take 40 mg by mouth at bedtime.) 90 capsule 1   furosemide  (LASIX ) 20 MG tablet Take 20 mg by mouth every Monday, Wednesday, and Friday.     gabapentin  (NEURONTIN ) 600 MG tablet Take 1,200 mg by mouth 2 (two) times daily.     insulin  aspart (NOVOLOG ) 100 UNIT/ML injection Inject 5-8 Units into the skin 3 (three) times daily before meals.     lamoTRIgine  (LAMICTAL ) 200 MG tablet Take one tablet at bedtime. 90 tablet 1   LANTUS  SOLOSTAR 100 UNIT/ML Solostar Pen Inject 48 Units into the skin at bedtime.     levocetirizine (XYZAL ) 5 MG tablet Take 5 mg by mouth every evening.     levothyroxine  (SYNTHROID ) 200 MCG tablet Take 200 mcg by mouth daily before breakfast.     LORazepam  (ATIVAN ) 0.5 MG tablet Take one to 2 tablets daily as needed for panic attacks. (Patient taking differently: Take 0.5 mg by mouth daily as needed (panic attacks). Take one to 2 tablets daily as needed for panic attacks.) 60 tablet 2   LOVASTATIN PO Take 80 mg by mouth at bedtime.     montelukast  (SINGULAIR ) 10 MG tablet Take 10 mg by mouth daily.     MOUNJARO 7.5 MG/0.5ML Pen Inject 7.5 mg into the skin every Wednesday.     ondansetron  (ZOFRAN ) 4 MG tablet Take 1 tablet (4 mg total) by mouth every 8 (eight) hours as needed for refractory nausea / vomiting. 20 tablet 1   oxybutynin  (DITROPAN ) 5 MG tablet Take 5 mg by mouth every 8 (eight) hours as needed for bladder spasms.     oxyCODONE  (ROXICODONE ) 15 MG immediate release tablet Take 15 mg by mouth every 6 (six) hours as needed for pain.     pantoprazole  (PROTONIX ) 40 MG tablet TAKE 1 TABLET BY MOUTH TWICE DAILY BEFORE A MEAL 60 tablet 3   QUEtiapine  (SEROQUEL ) 25 MG tablet Take 1 tablet (25 mg total) by mouth at bedtime. 90 tablet 1   rizatriptan  (MAXALT ) 10 MG tablet Take 1 tablet (10 mg total) by mouth every 6 (six) hours as  needed for migraine. May repeat in 2 hours if needed 10 tablet 11   traZODone  (DESYREL ) 100 MG tablet Take 1 tablet (100 mg total) by mouth at bedtime. 90 tablet 0   No current facility-administered medications for this visit.    Medication Side Effects: None  Allergies:  Allergies  Allergen Reactions   Hydromorphone Shortness Of Breath and Other (See Comments)    Respiratory arrest per patient Coded with fast admin   Penicillins Anaphylaxis   Erythromycin Base Nausea And Vomiting   Liraglutide Other (See Comments)    Unknown    Meperidine  Hcl Other (See Comments)    Unknown    Nsaids Other (See Comments)    GI bleed    Ibuprofen Other (See Comments)    Per pt, it makes her bleed   Meperidine  Hives    Past Medical History:  Diagnosis Date   Anxiety    Asthma    Chronic pain    Depression    Diabetes mellitus without complication (HCC)    Fibromyalgia    GERD (gastroesophageal reflux disease)    Headache    migraines   Hyperlipidemia    Hypothyroidism    Osteoarthritis    PONV (postoperative nausea and vomiting)     Family History  Problem Relation Age of Onset   Hypertension Mother    Heart attack Father        Late 22's   Stroke Father     Social History   Socioeconomic History   Marital status: Divorced    Spouse name: Not on file   Number of children: Not on file   Years of education: Not on file   Highest education level: Not on file  Occupational History   Not on file  Tobacco Use   Smoking status: Former    Types: Cigarettes    Passive exposure: Never   Smokeless tobacco: Never  Vaping Use   Vaping status: Never Used  Substance and Sexual Activity   Alcohol use: Never   Drug use: Never   Sexual activity: Not Currently    Birth control/protection: None  Other Topics Concern   Not on file  Social History Narrative   Caffiene 1-2 cups daily.   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    Social Drivers of  Health   Financial Resource Strain: Patient Unable To Answer (10/24/2023)   Received from [facility]   Overall Financial Resource Strain (CARDIA)    Difficulty of Paying Living Expenses: Patient unable to answer  Food Insecurity: No Food Insecurity (12/25/2023)   Hunger Vital Sign    Worried About Running Out of Food in the Last Year: Never true    Ran Out of Food in the Last Year: Never true  Transportation Needs: No Transportation Needs (12/25/2023)   PRAPARE - Administrator, Civil Service (Medical): No    Lack of Transportation (Non-Medical): No  Physical Activity: Patient Unable To Answer (10/24/2023)   Received from [facility]   Exercise Vital Sign    On average, how many days per week do you engage in moderate to strenuous exercise (like a brisk walk)?: Patient unable to answer    On average, how many minutes do you engage in exercise at this level?: Patient unable to answer  Stress: Patient Unable To Answer (10/24/2023)   Received from [facility] - Occupational Stress Questionnaire    Feeling of Stress : Patient unable to answer  Social Connections: Moderately Integrated (12/25/2023)   Social Connection and Isolation Panel    Frequency of Communication with Friends and Family: More than three times a week    Frequency of Social Gatherings with Friends and Family: More than three times a week    Attends Religious Services: More than 4 times per year    Active Member of [employer] or Organizations: Yes    Attends Banker Meetings: More than 4 times per year    Marital Status: Divorced  Intimate Partner Violence: Not At Risk (12/25/2023)   Humiliation, Afraid, Rape, and Kick questionnaire    Fear of Current or Ex-Partner: No    Emotionally Abused: No    Physically Abused: No    Sexually Abused: No    Past  Medical History, Surgical history, Social history, and Family history were reviewed and updated as  appropriate.   Please see review of systems for further details on the patient's review from today.   Objective:   Physical Exam:  There were no vitals taken for this visit.  Physical Exam Constitutional:      General: She is not in acute distress. Musculoskeletal:        General: No deformity.  Neurological:     Mental Status: She is alert and oriented to person, place, and time.     Coordination: Coordination normal.  Psychiatric:        Attention and Perception: Attention and perception normal. She does not perceive auditory or visual hallucinations.        Mood and Affect: Mood normal. Mood is not anxious or depressed. Affect is not labile, blunt, angry or inappropriate.        Speech: Speech normal.        Behavior: Behavior normal.        Thought Content: Thought content normal. Thought content is not paranoid or delusional. Thought content does not include homicidal or suicidal ideation. Thought content does not include homicidal or suicidal plan.        Cognition and Memory: Cognition and memory normal.        Judgment: Judgment normal.     Comments: Insight intact     Lab Review:     Component Value Date/Time   NA 139 12/24/2023 1522   K 4.0 12/24/2023 1522   CL 101 12/24/2023 1522   CO2 28 12/24/2023 1522   GLUCOSE 71 12/24/2023 1522   BUN 14 12/24/2023 1522   CREATININE 0.76 12/24/2023 1522   CALCIUM  9.1 12/24/2023 1522   PROT 5.8 (L) 12/23/2021 0527   ALBUMIN 2.8 (L) 12/23/2021 0527   AST 16 12/23/2021 0527   ALT 15 12/23/2021 0527   ALKPHOS 54 12/23/2021 0527   BILITOT 0.6 12/23/2021 0527   GFRNONAA >60 12/24/2023 1522       Component Value Date/Time   WBC 7.9 12/26/2023 0445   RBC 4.64 12/26/2023 0445   HGB 13.4 12/26/2023 0445   HCT 43.6 12/26/2023 0445   PLT 177 12/26/2023 0445   MCV 94.0 12/26/2023 0445   MCH 28.9 12/26/2023 0445   MCHC 30.7 12/26/2023 0445   RDW 12.9 12/26/2023 0445   LYMPHSABS 1.5 10/20/2023 1257   MONOABS 0.6 10/20/2023  1257   EOSABS 0.2 10/20/2023 1257   BASOSABS 0.0 10/20/2023 1257    No results found for: POCLITH, LITHIUM   No results found for: PHENYTOIN, PHENOBARB, VALPROATE, CBMZ   .res Assessment: Plan:    Plan:  PDMP reviewed  Add Lamictal  50mg  daily in the morning- depression Lamictal  200mg  daily  Seroquel  - 25mg  at bedtime  Wellbutrin  SR 200mg  BID - no tremor noted between increase form 150mg  BID to 200mg  BID Buspar  15mg  BID  Prozac  40mg  daily   Trazadone 150mg  at bedtime Ativan  0.5mg  BID daily prn as needed   Plans to see neurologist 02/27/2024 - tremors started with Abilify - then Abilify was stopped - mouth movements initially, no longer - tremors in both hands improved once stopped, but are now worse - mild to moderate.   Consider Propranolol for hand tremors.  RTC 6 weeks  25 minutes spent dedicated to the care of this patient on the date of this encounter to include pre-visit review of records, ordering of medication, post visit documentation, and face-to-face time with  the patient discussing MDD, [name], [name], insomnia and panic attacks. Discussed continuing current medication regimen.  Patient advised to contact office with any questions, adverse effects, or acute worsening in signs and symptoms.   Discussed potential benefits, risk, and side effects of benzodiazepines to include potential risk of tolerance and dependence, as well as possible drowsiness. Advised patient not to drive if experiencing drowsiness and to take lowest possible effective dose to minimize risk of dependence and tolerance.   Discussed potential metabolic side effects associated with atypical antipsychotics, as well as potential risk for movement side effects. Advised pt to contact office if movement side effects occur.    Diagnoses and all orders for this visit:  Major depressive disorder, recurrent episode, moderate (HCC) -     lamoTRIgine  (LAMICTAL ) 25 MG tablet; Take two tablets  every morning.  Generalized anxiety disorder  Panic attacks  [name] (post-traumatic stress disorder)  Insomnia, unspecified type     Please see After Visit Summary for patient specific instructions.  Future Appointments  Date Time Provider Department Center  02/19/2024  2:00 PM Ines Onetha NOVAK, MD [facility] None    No orders of the defined types were placed in this encounter.     -------------------------------

## 2024-02-19 ENCOUNTER — Encounter: Payer: Self-pay | Admitting: Neurology

## 2024-02-19 ENCOUNTER — Ambulatory Visit: Admitting: Neurology

## 2024-02-19 VITALS — BP 106/61 | HR 82 | Ht 63.0 in | Wt 210.0 lb

## 2024-02-19 DIAGNOSIS — G43709 Chronic migraine without aura, not intractable, without status migrainosus: Secondary | ICD-10-CM

## 2024-02-19 DIAGNOSIS — G259 Extrapyramidal and movement disorder, unspecified: Secondary | ICD-10-CM

## 2024-02-19 NOTE — Progress Notes (Unsigned)
 [name]    Provider:  Dr [name] Requesting Provider: Lennie Tapia ORN, FNP Primary Care Provider:  Lennie Tapia ORN, FNP  CC:  migraines and falls/imbalance  02/19/2024:  Today she wants to discuss back in May 13th, her husband is her and provide sinformation, husband says she wa confused, combative, couldn't talk, he got her out of bed, EMS came out, took her to [place] [name], per note 10/24/2023 Dr. Casimiro found Labs revealed WBC count of 11.7 with mild granulocytosis. Lactic acid 2.6,  , Urine toxicology was positive for opiates and oxycodone , CT of chest abdomen and pelvis revealed multifocal pneumonia involving right upper lobe and to a lesser extent bilateral lower lobes. Head CT without contrast was negative. Patient was given 1 dose of ceftriaxone and azithromycin and referred for admission, had agitation throughout, started on seroquel . By 5/17 doing better, alert and interactive per Dr. Casimiro, 5/18 better, diagnosed with delirium, MRI brain was negative for acute events, Delirium likely in the setting of sepsis, multifocal pneumonia and acute hypoxic respiratory failure as well as history of TBI   She tried a drug for depression and started to have tremors and was taken off of it and tremors improved. The seroquel  was decreased to 25mg  at bedtime. Since may she is not feeling right. Her speech is impaired, can't get it out, tremors started again after coming home. On muktiple psychiatric medications. Per psychiatry notes, tremor started with abilify: [name] 02/13/2024: tremors started with Abilify - then Abilify was stopped - mouth movements initially, no longer - tremors in both hands improved once stopped, but are now worse - mild to moderate   MRI brain 10/30/2023:  Melanie Oneil Lenis, MD - 10/30/2023  Formatting of this note might be different from the original.  Exam:  MRI Brain without Contrast   History:  Altered mental status; new focal  neurologic deficit.   Technique:  Complete brain MRI without IV contrast.   Comparison:  Head CT 10/24/2023   Findings: Patient motion artifact degrades fine anatomic detail. No evidence of an acute ischemic infarction, hemorrhage, edema, mass, or mass effect. No abnormal signal intensity throughout the brain parenchyma.   Ventricles and basilar cisterns unremarkable. Orbits and sellar/suprasellar region are without focal abnormality. Major intracranial vascular flow voids are patent. Calvarium, midline structures, and imaged extra cranial soft tissues unremarkable. Sinuses and mastoid air cells are clear.   IMPRESSION:  No acute pathology..   Signed (Electronic Signature): 10/24/2023 3:14 PM Signed By: Melanie Grim, MD Narrative  Exam:  CT of the Chest, Abdomen and Pelvis with Contrast  History: abd pain PNA ams  Technique:  Routine CT of the chest, abdomen and pelvis with IV contrast. AEC (automated exposure control) and/or manual techniques such as size-specific kV and mAs are employed where appropriate to reduce radiation exposure for all CT exams.  Comparison:   Same day chest radiograph  Chest CT Findings:  Exam degraded by respiratory motion.  LUNGS/PLEURA:   The central airways are patent. Multifocal ill-defined centrilobular dominant groundglass opacities, most pronounced in the right upper lobe, also involving the bilateral lower lobes.  No pleural effusion.  No pneumothorax.  10/05/2023: Melanie Born NP 1.  Chronic migraine headaches 2.  Chronic pain (on oxycodone ) 3.  Gait instability 4.  Mood disorder (weaning off Vraylar , on Wellbutrin , BuSpar , Lamictal , Prozac )  -Start Emgality  with loading dose, followed by monthly maintenance dosing for migraine prevention -Continue Maxalt  as needed for acute headache -Next steps: Vyepti,  she is not interested in Botox -Referral for sleep consult, rule out OSA (snoring, morning headache, daytime somnolence) -Follow-up in 6  months or sooner if needed  From a thorough review of records, medications tried that can be used in migraine/headache management include: Tylenol , aspirin , BuSpar , Decadron , Prozac , gabapentin , meclizine , Robaxin , Reglan , Zofran , oxycodone , prednisone , Compazine , Phenergan , Maxalt , Imitrex , tizanidine , trazodone , amitriptyline and nortriptyline and other tricyclic antidepressants contraindicated due to patient being on multiple serotonin drugs and risk of serotonin syndrome, trazodone , topiramate(did not help), aimovig contraindicated due to constipation, she has also tried amitriptyline, propranolol, topamax.  Ajovy  was denied.  HISTORY OF PRESENT ILLNESS: Today 04/04/23 Last seen in April 2024, I gave her 2 samples of Ajovy , worked awesome, had 1 migraines every 2 weeks, not severe. She applied for assistance but was denied. Insurance denied, we switched to Emgality , but she never picked it up. Having 1-2 migraines weekly. Has mild to moderate headache every other day. Taking Maxalt , it helps. 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 Never had sleep testing. Had knee replacement, developed pressure ulcer on her buttocks. Takes Tylenol  daily for headache or some ache or pain, can't take NSAIDS. Going to wean down on Vraylar  from psych due to hand tremors, drooling. Has lost 20 lbs since April, watching her diet, on Ozempic. Ready to pursue sleep consult, reports snoring, morning headache, daytime fatigue. Sluggish in the AM, not feeling like herself until noon. Had sleep apnea in the past, used CPAP years ago, didn't feel like she needed it anymore. Remains on oxycodone  for chronic pain.   Update 09/17/22 SS: When she saw Dr. Ines in November given Ajovy  in office and 3 samples. It tremendously helped her migraines 95%. She called her insurance, they covered but her portion was going to be $300. With the Ajovy  felt her balance improved, it continues to be better, having knee replacement in June on the right,  likely contributes to imbalance.  Right now without Ajovy  daily headache.  Had MRI cervical spine December 2023 showing mild degenerative changes C3-C4 through C6-C7.  No spinal stenosis or nerve root compression.  Referred for sleep study. Claims she never heard anything. Morning fatigue is not quite as bad. Husband says she snores.   Migraines are across forehead, in her eyes, sensitive to light, sound, nauseated, no vomiting. Takes Zofran . Takes Tylenol  and Maxalt , it helps.   Goes to pain specialist for back pain.   HPI:  [name] is a 68 y.o. female here as requested by [name] ORN, FNP for headaches,imbalance. PMHx GI bleed/rectal bleed, constipation on chronic opiods, hypokalemia and hypocalcemia, colitis, hypertension, hyperlipidemia, diabetes type 2, iron  deficiency anemia, depression, osteoarthritis of hips.  I reviewed Dr. Honore notes, she presented to discuss possible referral to neurology for impaired balance and migraines/headaches, she reported her ability to ambulate is impaired because of her imbalance and her pain, she reports when she bends over and stands upright she is unable to take any steps due to feeling off balance, she reports that her pain is center provider recommended that she see neurology again since her initial fall the neck and back pain is continued to worsen as well, she is followed by Ortho for continued treatment of neck and back pain and followed by psych.  She fell backwards out of a chair in 03/2018 and since having chronic pain, headaches, worsening migraines. She has chronic neck pain, srays completely tense, ongoing for years, failed conservative treatment and has been under the care of  pcp and ortho. Her migraines are on the right side, can spread across the forehead, pulsating/pounding/throbbing, photophobia, nausea, no vomiting. She has daily headaches, she used to have sleep apnea and lost 100 pounds and stopped using the cpap, she does have  some morning headaches, and nocturnal headaches, she wake up tired, takes her to the afternoon to feel better, Jer imbalance is when she stands she feels like she has to stabilize and if she walks she gets dizzy, imbalance, falls at least once a month. She went ti PT for 3 months in 2019.   Reviewed notes, labs and imaging from outside physicians, which showed:  From a thorough review of records, medications tried that can be used in migraine/headache management include: Tylenol , aspirin , BuSpar , Decadron , Prozac , gabapentin , meclizine , Robaxin , Reglan , Zofran , oxycodone , prednisone , Compazine , Phenergan , Maxalt , Imitrex , tizanidine , trazodone , amitriptyline and nortriptyline and other tricyclic antidepressants contraindicated due to patient being on multiple serotonin drugs and risk of serotonin syndrome, trazodone , topiramate(did not help), aimovig contraindicated due to constipation, she has also tried amitriptyline, propranolol, topamax.   10/12/2020: MRI brain: CLINICAL DATA:  Provided history: Unspecified fall, initial encounter. Additional history provided by scanning technologist: Patient reports frequent falls, dizziness, balance issues, severe headache, difficulty walking, symptoms for 6 months.   EXAM: MRI HEAD WITHOUT AND WITH CONTRAST   TECHNIQUE: Multiplanar, multiecho pulse sequences of the brain and surrounding structures were obtained without and with intravenous contrast.   CONTRAST:  20mL MULTIHANCE  GADOBENATE DIMEGLUMINE  529 MG/ML IV SOLN   COMPARISON:  Report from brain MRI 03/26/2012 (images unavailable).   FINDINGS: Brain:   Cerebral volume is normal for age.   No cortical encephalomalacia is identified. No significant white matter disease.   Prominent perivascular spaces within the basal ganglia bilateral.   There is no acute infarct.   No evidence of intracranial mass.   No chronic intracranial blood products.   No extra-axial fluid collection.   No  midline shift.   No abnormal intracranial enhancement.   Vascular: Expected proximal arterial flow voids.   Skull and upper cervical spine: No focal marrow lesion.   Sinuses/Orbits: Visualized orbits show no acute finding. Trace bilateral ethmoid sinus mucosal thickening. Small right maxillary sinus mucous retention cyst.   IMPRESSION: Unremarkable MRI appearance of the brain for age. No evidence of acute intracranial abnormality.   Mild paranasal sinus disease, as described.      Latest Ref Rng & Units 12/26/2023    4:45 AM 12/25/2023    7:09 AM 12/24/2023    3:22 PM  CBC  WBC 4.0 - 10.5 K/uL 7.9  6.6  8.3   Hemoglobin 12.0 - 15.0 g/dL 86.5  86.4  86.0   Hematocrit 36.0 - 46.0 % 43.6  43.0  42.5   Platelets 150 - 400 K/uL 177  179  179       Latest Ref Rng & Units 12/24/2023    3:22 PM 10/20/2023   12:57 PM 02/17/2023   10:05 AM  CMP  Glucose 70 - 99 mg/dL 71  820  850   BUN 8 - 23 mg/dL 14  12  12    Creatinine 0.44 - 1.00 mg/dL 9.23  9.22  9.11   Sodium 135 - 145 mmol/L 139  137  140   Potassium 3.5 - 5.1 mmol/L 4.0  4.0  4.3   Chloride 98 - 111 mmol/L 101  98  100   CO2 22 - 32 mmol/L 28  29  30    Calcium  8.9 -  10.3 mg/dL 9.1  9.4  9.2     Review of Systems: Patient complains of symptoms per HPI as well as the following symptoms chronic pain. Pertinent negatives and positives per HPI. All others negative.   Social History   Socioeconomic History   Marital status: Divorced    Spouse name: Not on file   Number of children: Not on file   Years of education: Not on file   Highest education level: Not on file  Occupational History   Not on file  Tobacco Use   Smoking status: Former    Types: Cigarettes    Passive exposure: Never   Smokeless tobacco: Never  Vaping Use   Vaping status: Never Used  Substance and Sexual Activity   Alcohol use: Never   Drug use: Never   Sexual activity: Not Currently    Birth control/protection: None  Other Topics Concern    Not on file  Social History Narrative   Caffiene 1-2 cups daily.   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    Social Drivers of Health   Financial Resource Strain: Patient Unable To Answer (10/24/2023)   Received from [facility]   Overall Financial Resource Strain (CARDIA)    Difficulty of Paying Living Expenses: Patient unable to answer  Food Insecurity: No Food Insecurity (12/25/2023)   Hunger Vital Sign    Worried About Running Out of Food in the Last Year: Never true    Ran Out of Food in the Last Year: Never true  Transportation Needs: No Transportation Needs (12/25/2023)   PRAPARE - Administrator, Civil Service (Medical): No    Lack of Transportation (Non-Medical): No  Physical Activity: Patient Unable To Answer (10/24/2023)   Received from [facility]   Exercise Vital Sign    On average, how many days per week do you engage in moderate to strenuous exercise (like a brisk walk)?: Patient unable to answer    On average, how many minutes do you engage in exercise at this level?: Patient unable to answer  Stress: Patient Unable To Answer (10/24/2023)   Received from [facility] - Occupational Stress Questionnaire    Feeling of Stress : Patient unable to answer  Social Connections: Moderately Integrated (12/25/2023)   Social Connection and Isolation Panel    Frequency of Communication with Friends and Family: More than three times a week    Frequency of Social Gatherings with Friends and Family: More than three times a week    Attends Religious Services: More than 4 times per year    Active Member of [employer] or Organizations: Yes    Attends Engineer, structural: More than 4 times per year    Marital Status: Divorced  Intimate Partner Violence: Not At Risk (12/25/2023)   Humiliation, Afraid, Rape, and Kick questionnaire    Fear of Current or Ex-Partner: No    Emotionally Abused:  No    Physically Abused: No    Sexually Abused: No    Family History  Problem Relation Age of Onset   Hypertension Mother    Heart attack Father        Late 37's   Stroke Father     Past Medical History:  Diagnosis Date   Anxiety    Asthma    Chronic pain    Depression    Diabetes mellitus without complication (  HCC)    Fibromyalgia    GERD (gastroesophageal reflux disease)    Headache    migraines   Hyperlipidemia    Hypothyroidism    Osteoarthritis    PONV (postoperative nausea and vomiting)     Patient Active Problem List   Diagnosis Date Noted   Chest pain 12/24/2023   Hypothyroidism    Asthma    Vulvovaginal candidiasis 11/01/2023   Perianal candidiasis 11/01/2023   Chronic migraine w/o aura w/o status migrainosus, not intractable 04/04/2023   Fibromyalgia 04/04/2023   Pressure ulcer, buttock, right, unstageable (HCC) 02/17/2023   OA (osteoarthritis) of knee 11/28/2022   Primary osteoarthritis of right knee 11/28/2022   GI bleed 12/23/2021   Rectal bleed 12/22/2021   Hypokalemia 12/22/2021   Hypocalcemia 12/22/2021   Colitis 12/22/2021   HTN (hypertension) 12/22/2021   Hyperlipidemia 12/22/2021   DM2 (diabetes mellitus, type 2) (HCC) 12/22/2021   Iron  deficiency anemia 12/22/2021   Depression 12/22/2021   OA (osteoarthritis) of hip 04/12/2021   Primary osteoarthritis of right hip 04/12/2021    Past Surgical History:  Procedure Laterality Date   ABDOMINAL HYSTERECTOMY     ANKLE RECONSTRUCTION Left    1977   APPENDECTOMY     CARPAL TUNNEL RELEASE Right    2010   CATARACT EXTRACTION Left    02/2021   catheter ablation     1993   CESAREAN SECTION     x2   CHOLECYSTECTOMY     HERNIA REPAIR     INCISION AND DRAINAGE OF WOUND Right 02/20/2023   Procedure: IRRIGATION AND DEBRIDEMENT BUTTOCKS;  Surgeon: Kallie Manuelita BROCKS, MD;  Location: [facility];  Service: General;  Laterality: Right;   KNEE ARTHROSCOPY W/ MENISCECTOMY Right    2008   KNEE  DISLOCATION SURGERY Right    1977   left carpal tunnel release      panectomy     2019   TOTAL HIP ARTHROPLASTY Right 04/12/2021   Procedure: TOTAL HIP ARTHROPLASTY ANTERIOR APPROACH;  Surgeon: Melodi Lerner, MD;  Location: [facility];  Service: Orthopedics;  Laterality: Right;   TOTAL KNEE ARTHROPLASTY Right 11/28/2022   Procedure: TOTAL KNEE ARTHROPLASTY;  Surgeon: Melodi Lerner, MD;  Location: [facility];  Service: Orthopedics;  Laterality: Right;   vaginal fistula of sigmoid colon      Current Outpatient Medications  Medication Sig Dispense Refill   albuterol  (VENTOLIN  HFA) 108 (90 Base) MCG/ACT inhaler Inhale 1-2 puffs into the lungs every 4 (four) hours as needed for shortness of breath or wheezing.     alum & mag hydroxide-simeth (MAALOX/MYLANTA) 200-200-20 MG/5ML suspension Take 15 mLs by mouth every 4 (four) hours as needed for indigestion or heartburn. 355 mL 0   buPROPion  (WELLBUTRIN  SR) 200 MG 12 hr tablet Take 1 tablet (200 mg total) by mouth 2 (two) times daily. 180 tablet 1   busPIRone  (BUSPAR ) 10 MG tablet Take 1.5 tablets (15 mg total) by mouth 2 (two) times daily. 60 tablet 5   Cholecalciferol (VITAMIN D) 125 MCG (5000 UT) CAPS Take 2 capsules by mouth daily.     estradiol (ESTRACE) 0.5 MG tablet Take 0.5 mg by mouth daily.     ferrous gluconate  (FERGON) 324 MG tablet Take 324 mg by mouth daily with breakfast.     FLUoxetine  (PROZAC ) 40 MG capsule Take 1 capsule (40 mg total) by mouth daily. (Patient taking differently: Take 40 mg by mouth at bedtime.) 90 capsule 1   furosemide  (LASIX ) 20 MG tablet Take  20 mg by mouth every Monday, Wednesday, and Friday.     gabapentin  (NEURONTIN ) 600 MG tablet Take 1,200 mg by mouth 2 (two) times daily.     insulin  aspart (NOVOLOG ) 100 UNIT/ML injection Inject 5-8 Units into the skin 3 (three) times daily before meals.     lamoTRIgine  (LAMICTAL ) 200 MG tablet Take one tablet at bedtime. 90 tablet 1   lamoTRIgine  (LAMICTAL ) 25 MG tablet Take two  tablets every morning. 60 tablet 2   LANTUS  SOLOSTAR 100 UNIT/ML Solostar Pen Inject 48 Units into the skin at bedtime.     levocetirizine (XYZAL ) 5 MG tablet Take 5 mg by mouth every evening.     levothyroxine  (SYNTHROID ) 200 MCG tablet Take 200 mcg by mouth daily before breakfast.     LORazepam  (ATIVAN ) 0.5 MG tablet Take one to 2 tablets daily as needed for panic attacks. (Patient taking differently: Take 0.5 mg by mouth daily as needed (panic attacks). Take one to 2 tablets daily as needed for panic attacks.) 60 tablet 2   LOVASTATIN PO Take 80 mg by mouth at bedtime.     montelukast  (SINGULAIR ) 10 MG tablet Take 10 mg by mouth daily.     MOUNJARO 7.5 MG/0.5ML Pen Inject 7.5 mg into the skin every Wednesday.     ondansetron  (ZOFRAN ) 4 MG tablet Take 1 tablet (4 mg total) by mouth every 8 (eight) hours as needed for refractory nausea / vomiting. 20 tablet 1   oxybutynin  (DITROPAN ) 5 MG tablet Take 5 mg by mouth every 8 (eight) hours as needed for bladder spasms.     oxyCODONE  (ROXICODONE ) 15 MG immediate release tablet Take 15 mg by mouth every 6 (six) hours as needed for pain. (Patient taking differently: Take 15 mg by mouth every 4 (four) hours.)     pantoprazole  (PROTONIX ) 40 MG tablet TAKE 1 TABLET BY MOUTH TWICE DAILY BEFORE A MEAL 60 tablet 3   QUEtiapine  (SEROQUEL ) 25 MG tablet Take 1 tablet (25 mg total) by mouth at bedtime. 90 tablet 1   rizatriptan  (MAXALT ) 10 MG tablet Take 1 tablet (10 mg total) by mouth every 6 (six) hours as needed for migraine. May repeat in 2 hours if needed 10 tablet 11   traZODone  (DESYREL ) 100 MG tablet Take 1 tablet (100 mg total) by mouth at bedtime. 90 tablet 0   No current facility-administered medications for this visit.    Allergies as of 02/19/2024 - Review Complete 02/19/2024  Allergen Reaction Noted   Hydromorphone Shortness Of Breath and Other (See Comments) 04/07/2012   Penicillins Anaphylaxis 04/07/2012   Erythromycin base Nausea And Vomiting  04/07/2012   Liraglutide Other (See Comments) 10/28/2021   Meperidine  hcl Other (See Comments) 09/26/2022   Nsaids Other (See Comments) 10/28/2021   Ibuprofen Other (See Comments) 12/21/2021   Meperidine  Hives 04/07/2012    Vitals: BP 106/61   Pulse 82   Ht 5' 3 (1.6 m)   Wt 210 lb (95.3 kg)   BMI 37.20 kg/m  Last Weight:  Wt Readings from Last 1 Encounters:  02/19/24 210 lb (95.3 kg)   Last Height:   Ht Readings from Last 1 Encounters:  02/19/24 5' 3 (1.6 m)    Akathisia, tremor right hand at rest, tappinf of feet, right hand resting tremor, increased tone right arm. Absent AJs, masked facies,    Assessment/Plan:  Patient with chronic migraines, likely sleep apnea (ESS 11, BMI > 40, large neck, had sleep apnea in the past), ataxia, chronic  pain.   Abilify and Seroquel  can cause akathisia and parkinsonism: She was on abilify n April and had similar symptoms and improved after stopping abilify. Simiar symptoms started after the seroquel  in May. Stop the seroquel .   OSA: She has daily headaches, she used to have sleep apnea and lost 100 pounds and stopped using the cpap, she does have some morning headaches, and nocturnal headaches, she wake up tired, takes her to the afternoon to feel better, ESS11 and FSS 36  MRI cervical spine: chronic neck pain has been under the care of physicians years, failed conservative measures and PT for 3 months. Looking for cervical myelopathy for ataxia. Her right biceps is brisker, she is ataxic, multiple falls. If neg MRi thoracic and lumbar spine.  Start Ajovy , gave 3 months sample for migraines, chronic, gave her the patient assistance paperwork to fill out. Could also try emgality  or botox. If she can;t afford the new meds could try Candesaartan next.   No orders of the defined types were placed in this encounter.  No orders of the defined types were placed in this encounter. \  Discussed risks of untreated sleep apnea including stroke,  fatigue, weight gain and others  Cc: [name] ORN, FNP,  [name] ORN, FNP  Melanie Epp, MD  [facility] [address] [place], [place] [address]  Phone [phone] Fax [phone]  I spent over 60 minutes of face-to-face and non-face-to-face time with patient on the  No diagnosis found.  diagnosis.  This included previsit chart review, lab review, study review, order entry, electronic health record documentation, patient education on the different diagnostic and therapeutic options, counseling and coordination of care, risks and benefits of management, compliance, or risk factor reduction

## 2024-02-19 NOTE — Patient Instructions (Addendum)
 Stop seroquel  If the symptoms don' timprove consider DAT scan(unlikley Parkinson's Disease, abilify and seroquel  can cause parkinsons-like picture)  Possible extrapyramidal side effects(in medications like abilify and seroquel ) include:  Akathisia - inner restlessness, inability to stay still  Dystonia - involuntary muscle contractions, often painful (e.g., twisting of neck, facial grimacing)  Parkinsonism - tremor, rigidity, slowed movements, shuffling gait  Tardive dyskinesia (TD) - involuntary, repetitive movements (e.g., lip smacking, tongue protrusion, limb movements); tends to occur after long-term use

## 2024-02-27 ENCOUNTER — Ambulatory Visit: Admitting: Neurology

## 2024-03-19 NOTE — Telephone Encounter (Signed)
 Called and left voicemail for patient to reschedule appointment on 10/30 with Dr Ines.  If patient calls back, they can be rescheduled with Dr Onita

## 2024-03-20 ENCOUNTER — Telehealth: Payer: Self-pay | Admitting: Adult Health

## 2024-03-20 NOTE — Telephone Encounter (Signed)
 Rtc to pt and she reports that her neurologist stopped her Seroquel  25 mg due to her shakiness on 9/8 and pt is having more mood swings, increase in depression, and not sleeping. Informed pt that would be expected with stopped that medication. Pt reports tremors are some better but other symptoms have worsened. Asking what does Angeline recommend doing anything such as restarting Seroquel  or something else? Pt has upcoming apt on 10/13

## 2024-03-20 NOTE — Telephone Encounter (Signed)
 Pt states she wants to speak with [facility] about a psychiatric issue she is having. Would not give more info.

## 2024-03-21 ENCOUNTER — Other Ambulatory Visit: Payer: Self-pay | Admitting: Adult Health

## 2024-03-21 DIAGNOSIS — F331 Major depressive disorder, recurrent, moderate: Secondary | ICD-10-CM

## 2024-03-21 MED ORDER — LAMOTRIGINE 100 MG PO TABS: ORAL_TABLET | ORAL | 2 refills | Status: DC

## 2024-03-21 NOTE — Telephone Encounter (Signed)
 Noted thank you

## 2024-03-25 ENCOUNTER — Other Ambulatory Visit: Payer: Self-pay | Admitting: Adult Health

## 2024-03-25 DIAGNOSIS — F411 Generalized anxiety disorder: Secondary | ICD-10-CM

## 2024-03-25 DIAGNOSIS — F331 Major depressive disorder, recurrent, moderate: Secondary | ICD-10-CM

## 2024-03-25 DIAGNOSIS — F4310 Post-traumatic stress disorder, unspecified: Secondary | ICD-10-CM

## 2024-03-26 ENCOUNTER — Ambulatory Visit: Admitting: Adult Health

## 2024-03-26 NOTE — Progress Notes (Signed)
 Patient no show appointment. ? ?

## 2024-04-05 ENCOUNTER — Other Ambulatory Visit: Payer: Self-pay | Admitting: Adult Health

## 2024-04-05 DIAGNOSIS — F411 Generalized anxiety disorder: Secondary | ICD-10-CM

## 2024-04-05 DIAGNOSIS — F410 Panic disorder [episodic paroxysmal anxiety] without agoraphobia: Secondary | ICD-10-CM

## 2024-04-07 NOTE — Telephone Encounter (Signed)
 Please call to schedule FU, was seen in Sept and was due for FU in 6 weeks.

## 2024-04-08 NOTE — Telephone Encounter (Signed)
 Lvm to sch

## 2024-04-11 ENCOUNTER — Telehealth: Admitting: Neurology

## 2024-04-12 ENCOUNTER — Telehealth (INDEPENDENT_AMBULATORY_CARE_PROVIDER_SITE_OTHER): Admitting: Adult Health

## 2024-04-12 ENCOUNTER — Encounter: Payer: Self-pay | Admitting: Adult Health

## 2024-04-12 DIAGNOSIS — G4700 Insomnia, unspecified: Secondary | ICD-10-CM

## 2024-04-12 MED ORDER — TRAZODONE HCL 150 MG PO TABS: 150.0000 mg | ORAL_TABLET | Freq: Every day | ORAL | 2 refills | Status: DC

## 2024-04-12 NOTE — Progress Notes (Signed)
 [name] [id number] Feb 26, 1956 68 y.o.  Virtual Visit via Video Note  I connected with pt @ on 04/12/24 at  1:00 PM EDT by a video enabled telemedicine application and verified that I am speaking with the correct person using two identifiers.   I discussed the limitations of evaluation and management by telemedicine and the availability of in person appointments. The patient expressed understanding and agreed to proceed.  I discussed the assessment and treatment plan with the patient. The patient was provided an opportunity to ask questions and all were answered. The patient agreed with the plan and demonstrated an understanding of the instructions.   The patient was advised to call back or seek an in-person evaluation if the symptoms worsen or if the condition fails to improve as anticipated.  I provided 25 minutes of non-face-to-face time during this encounter.  The patient was located at home.  The provider was located at [facility].   [name], NP   Subjective:   Patient ID:  [name] is a 68 y.o. (DOB [birth year]) female.  Chief Complaint: No chief complaint on file.   HPI [name] presents for follow-up of  MDD, [name], [name], insomnia and panic attacks.  Reports recent over night hospitalization due to chest pain. Collateral reviewed.  Describes mood today as about the same. Pleasant. Reports decreased tearfulness. Mood symptoms - reports depression, anxiety and irritability. Reports lower interest and motivation. Reports panic attacks. Reports  worry, rumination and over thinking. Reports chronic pain issues - followed by pain management. Reports mood is lower. Stating I'm still not where I would like to be. Reports medications are helpful, but is willing to consider other options. Reports meeting with Neurologist - Dr Ines on 02/19/2024 regarding tremor. Taking medications as prescribed. Energy levels lower. Active, does not have a  regular exercise routine with physical disabilities. Enjoys some usual interests and activities - seeing grandchildren. Note getting out as much - watching church online. Divorced from husband of 40 years, but lives with ex-husband and his mother in law - 110 years old. Has a dog yorkie - [name]. Has 3 grown children. Spending time with family. Appetite adequate. Weight stable 210 pounds - 63. Sleeps better some nights than others. Averages 5 to 6 hours once getting to sleep. Reports focus and concentration about the same. Completing some tasks. Managing minimal aspects of household. 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  Denies AH or VH. Denies self harm. Denies substance abuse. Denies paranoia.   Chronic pain - working with pain management.  Seeing Dr. Ines - managing migraines  Previous medication trials: Cymbalta, Prozac , Rexulti , Vraylar ,     Review of Systems:  Review of Systems  Musculoskeletal:  Negative for gait problem.  Neurological:  Negative for tremors.  Psychiatric/Behavioral:         Please refer to HPI    Medications: I have reviewed the patient's current medications.  Current Outpatient Medications  Medication Sig Dispense Refill   albuterol  (VENTOLIN  HFA) 108 (90 Base) MCG/ACT inhaler Inhale 1-2 puffs into the lungs every 4 (four) hours as needed for shortness of breath or wheezing.     alum & mag hydroxide-simeth (MAALOX/MYLANTA) 200-200-20 MG/5ML suspension Take 15 mLs by mouth every 4 (four) hours as needed for indigestion or heartburn. 355 mL 0   buPROPion  (WELLBUTRIN  SR) 200 MG 12 hr tablet Take 1 tablet (200 mg total) by mouth 2 (two) times  daily. 180 tablet 1   Cholecalciferol (VITAMIN D) 125 MCG (5000 UT) CAPS Take 2 capsules by mouth daily.     estradiol (ESTRACE) 0.5 MG tablet Take 0.5 mg by mouth daily.     ferrous gluconate  (FERGON) 324 MG tablet Take 324 mg by mouth daily with breakfast.     FLUoxetine  (PROZAC )  40 MG capsule Take 1 capsule by mouth once daily 90 capsule 0   furosemide  (LASIX ) 20 MG tablet Take 20 mg by mouth every Monday, Wednesday, and Friday.     gabapentin  (NEURONTIN ) 600 MG tablet Take 1,200 mg by mouth 2 (two) times daily.     insulin  aspart (NOVOLOG ) 100 UNIT/ML injection Inject 5-8 Units into the skin 3 (three) times daily before meals.     lamoTRIgine  (LAMICTAL ) 100 MG tablet Take one tablet every morning. 30 tablet 2   lamoTRIgine  (LAMICTAL ) 200 MG tablet Take one tablet at bedtime. 90 tablet 1   LANTUS  SOLOSTAR 100 UNIT/ML Solostar Pen Inject 48 Units into the skin at bedtime.     levocetirizine (XYZAL ) 5 MG tablet Take 5 mg by mouth every evening.     levothyroxine  (SYNTHROID ) 200 MCG tablet Take 200 mcg by mouth daily before breakfast.     LORazepam  (ATIVAN ) 0.5 MG tablet TAKE 1 TO 2 TABLETS BY MOUTH ONCE DAILY AS NEEDED FOR PANIC ATTACKS 60 tablet 0   LOVASTATIN PO Take 80 mg by mouth at bedtime.     montelukast  (SINGULAIR ) 10 MG tablet Take 10 mg by mouth daily.     MOUNJARO 7.5 MG/0.5ML Pen Inject 7.5 mg into the skin every Wednesday.     ondansetron  (ZOFRAN ) 4 MG tablet Take 1 tablet (4 mg total) by mouth every 8 (eight) hours as needed for refractory nausea / vomiting. 20 tablet 1   oxybutynin  (DITROPAN ) 5 MG tablet Take 5 mg by mouth every 8 (eight) hours as needed for bladder spasms.     oxyCODONE  (ROXICODONE ) 15 MG immediate release tablet Take 15 mg by mouth every 6 (six) hours as needed for pain. (Patient taking differently: Take 15 mg by mouth every 4 (four) hours.)     pantoprazole  (PROTONIX ) 40 MG tablet TAKE 1 TABLET BY MOUTH TWICE DAILY BEFORE A MEAL 60 tablet 3   rizatriptan  (MAXALT ) 10 MG tablet Take 1 tablet (10 mg total) by mouth every 6 (six) hours as needed for migraine. May repeat in 2 hours if needed 10 tablet 11   traZODone  (DESYREL ) 150 MG tablet Take 1 tablet (150 mg total) by mouth at bedtime. 30 tablet 2   No current facility-administered  medications for this visit.    Medication Side Effects: None  Allergies:  Allergies  Allergen Reactions   Hydromorphone Shortness Of Breath and Other (See Comments)    Respiratory arrest per patient Coded with fast admin   Penicillins Anaphylaxis   Erythromycin Base Nausea And Vomiting   Liraglutide Other (See Comments)    Unknown    Meperidine  Hcl Other (See Comments)    Unknown    Nsaids Other (See Comments)    GI bleed    Ibuprofen Other (See Comments)    Per pt, it makes her bleed   Meperidine  Hives    Past Medical History:  Diagnosis Date   Anxiety    Asthma    Chronic pain    Depression    Diabetes mellitus without complication (HCC)    Fibromyalgia    GERD (gastroesophageal reflux disease)    Headache  migraines   Hyperlipidemia    Hypothyroidism    Osteoarthritis    PONV (postoperative nausea and vomiting)     Family History  Problem Relation Age of Onset   Hypertension Mother    Heart attack Father        Late 62's   Stroke Father     Social History   Socioeconomic History   Marital status: Divorced    Spouse name: Not on file   Number of children: Not on file   Years of education: Not on file   Highest education level: Not on file  Occupational History   Not on file  Tobacco Use   Smoking status: Former    Types: Cigarettes    Passive exposure: Never   Smokeless tobacco: Never  Vaping Use   Vaping status: Never Used  Substance and Sexual Activity   Alcohol use: Never   Drug use: Never   Sexual activity: Not Currently    Birth control/protection: None  Other Topics Concern   Not on file  Social History Narrative   Caffiene 1-2 cups daily.   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    Social Drivers of Health   Financial Resource Strain: Patient Unable To Answer (10/24/2023)   Received from [facility]   Overall Financial Resource Strain (CARDIA)    Difficulty of Paying Living Expenses:  Patient unable to answer  Food Insecurity: No Food Insecurity (12/25/2023)   Hunger Vital Sign    Worried About Running Out of Food in the Last Year: Never true    Ran Out of Food in the Last Year: Never true  Transportation Needs: No Transportation Needs (12/25/2023)   PRAPARE - Administrator, Civil Service (Medical): No    Lack of Transportation (Non-Medical): No  Physical Activity: Patient Unable To Answer (10/24/2023)   Received from [facility]   Exercise Vital Sign    On average, how many days per week do you engage in moderate to strenuous exercise (like a brisk walk)?: Patient unable to answer    On average, how many minutes do you engage in exercise at this level?: Patient unable to answer  Stress: Patient Unable To Answer (10/24/2023)   Received from [facility] Occupational [facility] - Occupational Stress Questionnaire    Feeling of Stress : Patient unable to answer  Social Connections: Moderately Integrated (12/25/2023)   Social Connection and Isolation Panel    Frequency of Communication with Friends and Family: More than three times a week    Frequency of Social Gatherings with Friends and Family: More than three times a week    Attends Religious Services: More than 4 times per year    Active Member of [employer] or Organizations: Yes    Attends Banker Meetings: More than 4 times per year    Marital Status: Divorced  Intimate Partner Violence: Not At Risk (12/25/2023)   Humiliation, Afraid, Rape, and Kick questionnaire    Fear of Current or Ex-Partner: No    Emotionally Abused: No    Physically Abused: No    Sexually Abused: No    Past Medical History, Surgical history, Social history, and Family history were reviewed and updated as appropriate.   Please see review of systems for further details on the patient's review from today.   Objective:   Physical Exam:  There were no vitals taken  for this visit.  Physical  Exam Constitutional:      General: She is not in acute distress. Musculoskeletal:        General: No deformity.  Neurological:     Mental Status: She is alert and oriented to person, place, and time.     Coordination: Coordination normal.  Psychiatric:        Attention and Perception: Attention and perception normal. She does not perceive auditory or visual hallucinations.        Mood and Affect: Mood normal. Mood is not anxious or depressed. Affect is not labile, blunt, angry or inappropriate.        Speech: Speech normal.        Behavior: Behavior normal.        Thought Content: Thought content normal. Thought content is not paranoid or delusional. Thought content does not include homicidal or suicidal ideation. Thought content does not include homicidal or suicidal plan.        Cognition and Memory: Cognition and memory normal.        Judgment: Judgment normal.     Comments: Insight intact     Lab Review:     Component Value Date/Time   NA 139 12/24/2023 1522   K 4.0 12/24/2023 1522   CL 101 12/24/2023 1522   CO2 28 12/24/2023 1522   GLUCOSE 71 12/24/2023 1522   BUN 14 12/24/2023 1522   CREATININE 0.76 12/24/2023 1522   CALCIUM  9.1 12/24/2023 1522   PROT 5.8 (L) 12/23/2021 0527   ALBUMIN 2.8 (L) 12/23/2021 0527   AST 16 12/23/2021 0527   ALT 15 12/23/2021 0527   ALKPHOS 54 12/23/2021 0527   BILITOT 0.6 12/23/2021 0527   GFRNONAA >60 12/24/2023 1522       Component Value Date/Time   WBC 7.9 12/26/2023 0445   RBC 4.64 12/26/2023 0445   HGB 13.4 12/26/2023 0445   HCT 43.6 12/26/2023 0445   PLT 177 12/26/2023 0445   MCV 94.0 12/26/2023 0445   MCH 28.9 12/26/2023 0445   MCHC 30.7 12/26/2023 0445   RDW 12.9 12/26/2023 0445   LYMPHSABS 1.5 10/20/2023 1257   MONOABS 0.6 10/20/2023 1257   EOSABS 0.2 10/20/2023 1257   BASOSABS 0.0 10/20/2023 1257    No results found for: POCLITH, LITHIUM   No results found for: PHENYTOIN, PHENOBARB, VALPROATE, CBMZ    .res Assessment: Plan:    Plan:  PDMP reviewed  D/C Seroquel  - 25mg  at bedtime - tremors have gotten a little better since stopping in mid September.  Consider Propranolol 10mg  BID for hand tremors. Tremors worse when getting upset.  Lamictal  100mg  daily in the morning - depression Lamictal  200mg  daily at bedtime - depression  Wellbutrin  SR 200mg  BID  Buspar  15mg  BID  Prozac  40mg  daily   Trazadone 100 mg to 150mg  at bedtime Ativan  0.5mg  BID daily prn as needed   RTC 6 weeks  25 minutes spent dedicated to the care of this patient on the date of this encounter to include pre-visit review of records, ordering of medication, post visit documentation, and face-to-face time with the patient discussing MDD, [name], [name], insomnia and panic attacks. Discussed continuing current medication regimen.  Patient advised to contact office with any questions, adverse effects, or acute worsening in signs and symptoms.   Discussed potential benefits, risk, and side effects of benzodiazepines to include potential risk of tolerance and dependence, as well as possible drowsiness. Advised patient not to drive if experiencing drowsiness and to  take lowest possible effective dose to minimize risk of dependence and tolerance.   Discussed potential metabolic side effects associated with atypical antipsychotics, as well as potential risk for movement side effects. Advised pt to contact office if movement side effects occur.    Diagnoses and all orders for this visit:  Insomnia, unspecified type -     traZODone  (DESYREL ) 150 MG tablet; Take 1 tablet (150 mg total) by mouth at bedtime.     Please see After Visit Summary for patient specific instructions.  No future appointments.   No orders of the defined types were placed in this encounter.     -------------------------------

## 2024-05-05 ENCOUNTER — Other Ambulatory Visit: Payer: Self-pay | Admitting: Gastroenterology

## 2024-05-05 DIAGNOSIS — K219 Gastro-esophageal reflux disease without esophagitis: Secondary | ICD-10-CM

## 2024-05-15 ENCOUNTER — Other Ambulatory Visit: Payer: Self-pay | Admitting: Gastroenterology

## 2024-05-15 DIAGNOSIS — K219 Gastro-esophageal reflux disease without esophagitis: Secondary | ICD-10-CM

## 2024-05-24 ENCOUNTER — Telehealth: Admitting: Adult Health

## 2024-05-24 ENCOUNTER — Encounter: Payer: Self-pay | Admitting: Adult Health

## 2024-05-24 DIAGNOSIS — G4700 Insomnia, unspecified: Secondary | ICD-10-CM | POA: Diagnosis not present

## 2024-05-24 DIAGNOSIS — F410 Panic disorder [episodic paroxysmal anxiety] without agoraphobia: Secondary | ICD-10-CM | POA: Diagnosis not present

## 2024-05-24 DIAGNOSIS — F411 Generalized anxiety disorder: Secondary | ICD-10-CM

## 2024-05-24 DIAGNOSIS — F331 Major depressive disorder, recurrent, moderate: Secondary | ICD-10-CM

## 2024-05-24 DIAGNOSIS — F4310 Post-traumatic stress disorder, unspecified: Secondary | ICD-10-CM

## 2024-05-24 MED ORDER — LORAZEPAM 0.5 MG PO TABS: ORAL_TABLET | ORAL | 2 refills | Status: AC

## 2024-05-24 MED ORDER — LAMOTRIGINE 100 MG PO TABS: ORAL_TABLET | ORAL | 1 refills | Status: DC

## 2024-05-24 MED ORDER — LAMOTRIGINE 200 MG PO TABS: ORAL_TABLET | ORAL | 1 refills | Status: AC

## 2024-05-24 NOTE — Progress Notes (Signed)
 [name] [id number] December 02, 1955 68 y.o.  Virtual Visit via Video Note  I connected with pt @ on 05/24/2024 at  1:30 PM EST by a video enabled telemedicine application and verified that I am speaking with the correct person using two identifiers.   I discussed the limitations of evaluation and management by telemedicine and the availability of in person appointments. The patient expressed understanding and agreed to proceed.  I discussed the assessment and treatment plan with the patient. The patient was provided an opportunity to ask questions and all were answered. The patient agreed with the plan and demonstrated an understanding of the instructions.   The patient was advised to call back or seek an in-person evaluation if the symptoms worsen or if the condition fails to improve as anticipated.  I provided 25 minutes of non-face-to-face time during this encounter.  The patient was located at home.  The provider was located at [facility].   Melanie LOISE Sayers, NP   Subjective:   Patient ID:  [name] is a 68 y.o. (DOB 02-01-1956) female.  Chief Complaint: No chief complaint on file.   [name], [name], [name], insomnia and panic attacks.  Describes mood today as not the best. Pleasant. Reports tearfulness - trying to control it. Mood symptoms - reports some depression and anxiety - not as bad. Reports lower interest and motivation. Reports occasional irritability - family - people in the grocery store. Reports decreased panic attacks. Reports worry, rumination and over thinking. Reports chronic pain issues - followed by pain management. Reports mood is lower - not as interested in things as I was in the past. Reports increase in Lamictal  and Trazadone has been helpful for mood and sleep. Stating I feel like I'm doing a little better. Taking medications as prescribed. Energy levels lower. Active, does not have a  regular exercise routine with physical disabilities. Enjoys some usual interests and activities - seeing grandchildren. She is mostly staying at home. Divorced from husband of 40 years, but lives with ex-husband and his mother in law - 41 years old. Has a dog yorkie - [name]. Has 3 grown children. Spending time with family. Appetite adequate. Weight stable 210 pounds - 65. Sleeps well most nights. Averages at least 10 hours once getting to sleep. Reports focus and concentration difficulties. Completing some tasks. Managing minimal aspects of household - no bending and twisting. 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  Denies AH or VH. Denies self harm. Denies substance abuse. Denies paranoia.   Chronic pain - working with pain management.  Seeing Dr. Ines - managing migraines  Previous medication trials: Cymbalta, Prozac , Rexulti , Vraylar ,     Review of Systems:  Review of Systems  Medications: I have reviewed the patient's current medications.  Current Outpatient Medications  Medication Sig Dispense Refill   albuterol  (VENTOLIN  HFA) 108 (90 Base) MCG/ACT inhaler Inhale 1-2 puffs into the lungs every 4 (four) hours as needed for shortness of breath or wheezing.     alum & mag hydroxide-simeth (MAALOX/MYLANTA) 200-200-20 MG/5ML suspension Take 15 mLs by mouth every 4 (four) hours as needed for indigestion or heartburn. 355 mL 0   buPROPion  (WELLBUTRIN  SR) 200 MG 12 hr tablet Take 1 tablet (200 mg total) by mouth 2 (two) times daily. 180 tablet 1   Cholecalciferol (VITAMIN D) 125 MCG (5000 UT) CAPS Take 2 capsules by mouth daily.  estradiol (ESTRACE) 0.5 MG tablet Take 0.5 mg by mouth daily.     ferrous gluconate  (FERGON) 324 MG tablet Take 324 mg by mouth daily with breakfast.     FLUoxetine  (PROZAC ) 40 MG capsule Take 1 capsule by mouth once daily 90 capsule 0   furosemide  (LASIX ) 20 MG tablet Take 20 mg by mouth every Monday, Wednesday, and  Friday.     gabapentin  (NEURONTIN ) 600 MG tablet Take 1,200 mg by mouth 2 (two) times daily.     insulin  aspart (NOVOLOG ) 100 UNIT/ML injection Inject 5-8 Units into the skin 3 (three) times daily before meals.     lamoTRIgine  (LAMICTAL ) 100 MG tablet Take one tablet every morning. 90 tablet 1   lamoTRIgine  (LAMICTAL ) 200 MG tablet Take one tablet at bedtime. 90 tablet 1   LANTUS  SOLOSTAR 100 UNIT/ML Solostar Pen Inject 48 Units into the skin at bedtime.     levocetirizine (XYZAL ) 5 MG tablet Take 5 mg by mouth every evening.     levothyroxine  (SYNTHROID ) 200 MCG tablet Take 200 mcg by mouth daily before breakfast.     LORazepam  (ATIVAN ) 0.5 MG tablet TAKE 1 TO 2 TABLETS BY MOUTH ONCE DAILY AS NEEDED FOR PANIC ATTACKS 60 tablet 2   LOVASTATIN PO Take 80 mg by mouth at bedtime.     montelukast  (SINGULAIR ) 10 MG tablet Take 10 mg by mouth daily.     MOUNJARO 7.5 MG/0.5ML Pen Inject 7.5 mg into the skin every Wednesday.     ondansetron  (ZOFRAN ) 4 MG tablet Take 1 tablet (4 mg total) by mouth every 8 (eight) hours as needed for refractory nausea / vomiting. 20 tablet 1   oxybutynin  (DITROPAN ) 5 MG tablet Take 5 mg by mouth every 8 (eight) hours as needed for bladder spasms.     oxyCODONE  (ROXICODONE ) 15 MG immediate release tablet Take 15 mg by mouth every 6 (six) hours as needed for pain. (Patient taking differently: Take 15 mg by mouth every 4 (four) hours.)     pantoprazole  (PROTONIX ) 40 MG tablet TAKE 1 TABLET BY MOUTH TWICE DAILY BEFORE A MEAL 60 tablet 3   rizatriptan  (MAXALT ) 10 MG tablet Take 1 tablet (10 mg total) by mouth every 6 (six) hours as needed for migraine. May repeat in 2 hours if needed 10 tablet 11   traZODone  (DESYREL ) 150 MG tablet Take 1 tablet (150 mg total) by mouth at bedtime. 30 tablet 2   No current facility-administered medications for this visit.    Medication Side Effects: None  Allergies: Allergies[1]  Past Medical History:  Diagnosis Date   Anxiety    Asthma     Chronic pain    Depression    Diabetes mellitus without complication (HCC)    Fibromyalgia    GERD (gastroesophageal reflux disease)    Headache    migraines   Hyperlipidemia    Hypothyroidism    Osteoarthritis    PONV (postoperative nausea and vomiting)     Family History  Problem Relation Age of Onset   Hypertension Mother    Heart attack Father        Late 86's   Stroke Father     Social History   Socioeconomic History   Marital status: Divorced    Spouse name: Not on file   Number of children: Not on file   Years of education: Not on file   Highest education level: Not on file  Occupational History   Not on file  Tobacco Use  Smoking status: Former    Types: Cigarettes    Passive exposure: Never   Smokeless tobacco: Never  Vaping Use   Vaping status: Never Used  Substance and Sexual Activity   Alcohol use: Never   Drug use: Never   Sexual activity: Not Currently    Birth control/protection: None  Other Topics Concern   Not on file  Social History Narrative   Caffiene 1-2 cups daily.   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    Social Drivers of Health   Tobacco Use: Medium Risk (05/24/2024)   Patient History    Smoking Tobacco Use: Former    Smokeless Tobacco Use: Never    Passive Exposure: Never  Physicist, Medical Strain: Patient Unable To Answer (10/24/2023)   Received from [facility]   Overall Financial Resource Strain (CARDIA)    Difficulty of Paying Living Expenses: Patient unable to answer  Food Insecurity: No Food Insecurity (12/25/2023)   Epic    Worried About Programme Researcher, Broadcasting/film/video in the Last Year: Never true    Ran Out of Food in the Last Year: Never true  Transportation Needs: No Transportation Needs (12/25/2023)   Epic    Lack of Transportation (Medical): No    Lack of Transportation (Non-Medical): No  Physical Activity: Patient Unable To Answer (10/24/2023)   [facility]    Exercise Vital Sign    On average, how many days per week do you engage in moderate to strenuous exercise (like a brisk walk)?: Patient unable to answer    On average, how many minutes do you engage in exercise at this level?: Patient unable to answer  Stress: Patient Unable To Answer (10/24/2023)   Received from [facility] - Occupational Stress Questionnaire    Feeling of Stress : Patient unable to answer  Social Connections: Moderately Integrated (12/25/2023)   Social Connection and Isolation Panel    Frequency of Communication with Friends and Family: More than three times a week    Frequency of Social Gatherings with Friends and Family: More than three times a week    Attends Religious Services: More than 4 times per year    Active Member of [employer] or Organizations: Yes    Attends Banker Meetings: More than 4 times per year    Marital Status: Divorced  Intimate Partner Violence: Not At Risk (12/25/2023)   Epic    Fear of Current or Ex-Partner: No    Emotionally Abused: No    Physically Abused: No    Sexually Abused: No  Depression (PHQ2-9): Not on file  Alcohol Screen: Not on file  Housing: Low Risk (12/25/2023)   Epic    Unable to Pay for Housing in the Last Year: No    Number of Times Moved in the Last Year: 0    Homeless in the Last Year: No  Utilities: Not At Risk (12/25/2023)   Epic    Threatened with loss of utilities: No  Health Literacy: Not on file (10/24/2023)    Past Medical History, Surgical history, Social history, and Family history were reviewed and updated as appropriate.   Please see review of systems for further details on the patient's review from today.   Objective:   Physical Exam:  There were no vitals taken for this visit.  Physical Exam  Lab Review:     Component Value  Date/Time   NA 139 12/24/2023 1522   K 4.0 12/24/2023 1522   CL 101 12/24/2023 1522   CO2 28 12/24/2023 1522    GLUCOSE 71 12/24/2023 1522   BUN 14 12/24/2023 1522   CREATININE 0.76 12/24/2023 1522   CALCIUM  9.1 12/24/2023 1522   PROT 5.8 (L) 12/23/2021 0527   ALBUMIN 2.8 (L) 12/23/2021 0527   AST 16 12/23/2021 0527   ALT 15 12/23/2021 0527   ALKPHOS 54 12/23/2021 0527   BILITOT 0.6 12/23/2021 0527   GFRNONAA >60 12/24/2023 1522       Component Value Date/Time   WBC 7.9 12/26/2023 0445   RBC 4.64 12/26/2023 0445   HGB 13.4 12/26/2023 0445   HCT 43.6 12/26/2023 0445   PLT 177 12/26/2023 0445   MCV 94.0 12/26/2023 0445   MCH 28.9 12/26/2023 0445   MCHC 30.7 12/26/2023 0445   RDW 12.9 12/26/2023 0445   LYMPHSABS 1.5 10/20/2023 1257   MONOABS 0.6 10/20/2023 1257   EOSABS 0.2 10/20/2023 1257   BASOSABS 0.0 10/20/2023 1257    No results found for: POCLITH, LITHIUM   No results found for: PHENYTOIN, PHENOBARB, VALPROATE, CBMZ   .res Assessment: Plan:   Plan:  PDMP reviewed  Lamictal  100mg  daily in the morning - depression Lamictal  200mg  daily at bedtime - depression  Wellbutrin  SR 200mg  BID  Buspar  15mg  BID  Prozac  40mg  daily   Trazadone 150mg  at bedtime Ativan  0.5mg  BID daily prn as needed   RTC 6/8 weeks  25 minutes spent dedicated to the care of this patient on the date of this encounter to include pre-visit review of records, ordering of medication, post visit documentation, and face-to-face time with the patient discussing MDD, [name], [name], insomnia and panic attacks. Discussed continuing current medication regimen.  Patient advised to contact office with any questions, adverse effects, or acute worsening in signs and symptoms.   Discussed potential benefits, risk, and side effects of benzodiazepines to include potential risk of tolerance and dependence, as well as possible drowsiness. Advised patient not to drive if experiencing drowsiness and to take lowest possible effective dose to minimize risk of dependence and tolerance.   Discussed potential metabolic  side effects associated with atypical antipsychotics, as well as potential risk for movement side effects. Advised pt to contact office if movement side effects occur.     Diagnoses and all orders for this visit:  Major depressive disorder, recurrent episode, moderate (HCC) -     lamoTRIgine  (LAMICTAL ) 100 MG tablet; Take one tablet every morning. -     lamoTRIgine  (LAMICTAL ) 200 MG tablet; Take one tablet at bedtime.  Generalized anxiety disorder -     LORazepam  (ATIVAN ) 0.5 MG tablet; TAKE 1 TO 2 TABLETS BY MOUTH ONCE DAILY AS NEEDED FOR PANIC ATTACKS  Panic attacks -     LORazepam  (ATIVAN ) 0.5 MG tablet; TAKE 1 TO 2 TABLETS BY MOUTH ONCE DAILY AS NEEDED FOR PANIC ATTACKS  Insomnia, unspecified type  [name] (post-traumatic stress disorder)     Please see After Visit Summary for patient specific instructions.  No future appointments.   No orders of the defined types were placed in this encounter.     -------------------------------      [1]  Allergies Allergen Reactions   Hydromorphone Shortness Of Breath and Other (See Comments)    Respiratory arrest per patient Coded with fast admin   Penicillins Anaphylaxis   Erythromycin Base Nausea And Vomiting   Liraglutide Other (See Comments)  Unknown    Meperidine  Hcl Other (See Comments)    Unknown    Nsaids Other (See Comments)    GI bleed    Ibuprofen Other (See Comments)    Per pt, it makes her bleed   Meperidine  Hives

## 2024-06-13 ENCOUNTER — Other Ambulatory Visit: Payer: Self-pay | Admitting: Adult Health

## 2024-06-13 DIAGNOSIS — F411 Generalized anxiety disorder: Secondary | ICD-10-CM

## 2024-06-13 DIAGNOSIS — F4310 Post-traumatic stress disorder, unspecified: Secondary | ICD-10-CM

## 2024-06-13 DIAGNOSIS — F331 Major depressive disorder, recurrent, moderate: Secondary | ICD-10-CM

## 2024-06-22 ENCOUNTER — Other Ambulatory Visit: Payer: Self-pay | Admitting: Adult Health

## 2024-06-22 DIAGNOSIS — F331 Major depressive disorder, recurrent, moderate: Secondary | ICD-10-CM

## 2024-07-08 ENCOUNTER — Other Ambulatory Visit: Payer: Self-pay | Admitting: Adult Health

## 2024-07-08 DIAGNOSIS — G4700 Insomnia, unspecified: Secondary | ICD-10-CM

## 2024-07-19 ENCOUNTER — Telehealth: Admitting: Adult Health

## 2024-07-19 ENCOUNTER — Encounter: Payer: Self-pay | Admitting: Adult Health

## 2024-07-19 DIAGNOSIS — F410 Panic disorder [episodic paroxysmal anxiety] without agoraphobia: Secondary | ICD-10-CM

## 2024-07-19 DIAGNOSIS — F411 Generalized anxiety disorder: Secondary | ICD-10-CM

## 2024-07-19 DIAGNOSIS — G4700 Insomnia, unspecified: Secondary | ICD-10-CM

## 2024-07-19 DIAGNOSIS — F4310 Post-traumatic stress disorder, unspecified: Secondary | ICD-10-CM

## 2024-07-19 DIAGNOSIS — F331 Major depressive disorder, recurrent, moderate: Secondary | ICD-10-CM

## 2024-07-19 MED ORDER — LAMOTRIGINE 150 MG PO TABS: ORAL_TABLET | ORAL | 2 refills | Status: AC

## 2024-07-19 NOTE — Progress Notes (Signed)
 [name] [id number] 07/20/1955 69 y.o.  Virtual Visit via Video Note  I connected with pt @ on 07/19/24 at  3:30 PM EST by a video enabled telemedicine application and verified that I am speaking with the correct person using two identifiers.   I discussed the limitations of evaluation and management by telemedicine and the availability of in person appointments. The patient expressed understanding and agreed to proceed.  I discussed the assessment and treatment plan with the patient. The patient was provided an opportunity to ask questions and all were answered. The patient agreed with the plan and demonstrated an understanding of the instructions.   The patient was advised to call back or seek an in-person evaluation if the symptoms worsen or if the condition fails to improve as anticipated.  I provided 25 minutes of non-face-to-face time during this encounter.  The patient was located at home.  The provider was located at [facility].   [name], NP   Subjective:   Patient ID:  [name] is a 69 y.o. (DOB Oct 26, 1955) female.  Chief Complaint: No chief complaint on file.   HPI Raenette B Dahms presents for follow-up of  MDD, [name], [name], insomnia and panic attacks.  Describes mood today as not too good. Pleasant. Reports tearfulness - every day. Mood symptoms - reports depression. Reports getting anxious a lot. Reports lower interest and motivation - not as good. Reports irritability - frustration. Reports  panic attacks. Reports worry, rumination and over thinking. Reports chronic pain issues - better when taking medications - followed by pain management. Reports mood is lower. Stating I don't feel like I'm doing too good. Taking medications as prescribed. Energy levels lower. Active, does not have a regular exercise routine with physical disabilities. Enjoys some usual interests and activities. Reports she is mostly staying at home.  Divorced from husband of 40 years, but lives with ex-husband and his mother in law - 46 years old. Has a dog yorkie - [name]. Has 3 grown children. Spending time with family. Appetite adequate. Weight stable 210 pounds - 63. Reports sleeping well most nights. Averages at least 10 hours once getting to sleep. Reports focus and concentration difficulties. Completing some tasks - not like I need to be. Managing minimal aspects of household. 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  Denies AH or VH. Denies self harm. Denies substance abuse. Denies paranoia.  Chronic pain - working with pain management.  Seeing Dr. Ines - managing migraines  Previous medication trials: Cymbalta, Prozac , Rexulti , Vraylar , Abilify   Review of Systems:  Review of Systems  Musculoskeletal:  Negative for gait problem.  Neurological:  Negative for tremors.  Psychiatric/Behavioral:         Please refer to HPI    Medications: I have reviewed the patient's current medications.  Current Outpatient Medications  Medication Sig Dispense Refill   albuterol  (VENTOLIN  HFA) 108 (90 Base) MCG/ACT inhaler Inhale 1-2 puffs into the lungs every 4 (four) hours as needed for shortness of breath or wheezing.     alum & mag hydroxide-simeth (MAALOX/MYLANTA) 200-200-20 MG/5ML suspension Take 15 mLs by mouth every 4 (four) hours as needed for indigestion or heartburn. 355 mL 0   buPROPion  (WELLBUTRIN  SR) 200 MG 12 hr tablet Take 1 tablet (200 mg total) by mouth 2 (two) times daily. 180 tablet 1   Cholecalciferol (VITAMIN D) 125 MCG (5000 UT) CAPS Take 2 capsules by mouth daily.  estradiol (ESTRACE) 0.5 MG tablet Take 0.5 mg by mouth daily.     ferrous gluconate  (FERGON) 324 MG tablet Take 324 mg by mouth daily with breakfast.     FLUoxetine  (PROZAC ) 40 MG capsule Take 1 capsule by mouth once daily 90 capsule 0   furosemide  (LASIX ) 20 MG tablet Take 20 mg by mouth every Monday, Wednesday, and  Friday.     gabapentin  (NEURONTIN ) 600 MG tablet Take 1,200 mg by mouth 2 (two) times daily.     insulin  aspart (NOVOLOG ) 100 UNIT/ML injection Inject 5-8 Units into the skin 3 (three) times daily before meals.     lamoTRIgine  (LAMICTAL ) 100 MG tablet Take one tablet every morning. 90 tablet 1   lamoTRIgine  (LAMICTAL ) 200 MG tablet Take one tablet at bedtime. 90 tablet 1   LANTUS  SOLOSTAR 100 UNIT/ML Solostar Pen Inject 48 Units into the skin at bedtime.     levocetirizine (XYZAL ) 5 MG tablet Take 5 mg by mouth every evening.     levothyroxine  (SYNTHROID ) 200 MCG tablet Take 200 mcg by mouth daily before breakfast.     LORazepam  (ATIVAN ) 0.5 MG tablet TAKE 1 TO 2 TABLETS BY MOUTH ONCE DAILY AS NEEDED FOR PANIC ATTACKS 60 tablet 2   LOVASTATIN PO Take 80 mg by mouth at bedtime.     montelukast  (SINGULAIR ) 10 MG tablet Take 10 mg by mouth daily.     MOUNJARO 7.5 MG/0.5ML Pen Inject 7.5 mg into the skin every Wednesday.     ondansetron  (ZOFRAN ) 4 MG tablet Take 1 tablet (4 mg total) by mouth every 8 (eight) hours as needed for refractory nausea / vomiting. 20 tablet 1   oxybutynin  (DITROPAN ) 5 MG tablet Take 5 mg by mouth every 8 (eight) hours as needed for bladder spasms.     oxyCODONE  (ROXICODONE ) 15 MG immediate release tablet Take 15 mg by mouth every 6 (six) hours as needed for pain. (Patient taking differently: Take 15 mg by mouth every 4 (four) hours.)     pantoprazole  (PROTONIX ) 40 MG tablet TAKE 1 TABLET BY MOUTH TWICE DAILY BEFORE A MEAL 60 tablet 3   rizatriptan  (MAXALT ) 10 MG tablet Take 1 tablet (10 mg total) by mouth every 6 (six) hours as needed for migraine. May repeat in 2 hours if needed 10 tablet 11   traZODone  (DESYREL ) 150 MG tablet TAKE 1 TABLET BY MOUTH AT BEDTIME 30 tablet 2   No current facility-administered medications for this visit.    Medication Side Effects: None  Allergies: Allergies[1]  Past Medical History:  Diagnosis Date   Anxiety    Asthma    Chronic  pain    Depression    Diabetes mellitus without complication (HCC)    Fibromyalgia    GERD (gastroesophageal reflux disease)    Headache    migraines   Hyperlipidemia    Hypothyroidism    Osteoarthritis    PONV (postoperative nausea and vomiting)     Family History  Problem Relation Age of Onset   Hypertension Mother    Heart attack Father        Late 29's   Stroke Father     Social History   Socioeconomic History   Marital status: Divorced    Spouse name: Not on file   Number of children: Not on file   Years of education: Not on file   Highest education level: Not on file  Occupational History   Not on file  Tobacco Use   Smoking  status: Former    Types: Cigarettes    Passive exposure: Never   Smokeless tobacco: Never  Vaping Use   Vaping status: Never Used  Substance and Sexual Activity   Alcohol use: Never   Drug use: Never   Sexual activity: Not Currently    Birth control/protection: None  Other Topics Concern   Not on file  Social History Narrative   Caffiene 1-2 cups daily.   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    Social Drivers of Health   Tobacco Use: Medium Risk (07/11/2024)   Received from [facility]   Patient History    Smoking Tobacco Use: Former    Smokeless Tobacco Use: Never    Passive Exposure: Not on file  Financial Resource Strain: Patient Unable To Answer (10/24/2023)   Received from [facility]   Overall Financial Resource Strain (CARDIA)    Difficulty of Paying Living Expenses: Patient unable to answer  Food Insecurity: No Food Insecurity (12/25/2023)   Epic    Worried About Programme Researcher, Broadcasting/film/video in the Last Year: Never true    Ran Out of Food in the Last Year: Never true  Transportation Needs: No Transportation Needs (12/25/2023)   Epic    Lack of Transportation (Medical): No    Lack of Transportation (Non-Medical): No  Physical Activity: Patient Unable To Answer (10/24/2023)   Received  from [facility]   Exercise Vital Sign    On average, how many days per week do you engage in moderate to strenuous exercise (like a brisk walk)?: Patient unable to answer    On average, how many minutes do you engage in exercise at this level?: Patient unable to answer  Stress: Patient Unable To Answer (10/24/2023)   Received from [facility] - Occupational Stress Questionnaire    Feeling of Stress : Patient unable to answer  Social Connections: Moderately Integrated (12/25/2023)   Social Connection and Isolation Panel    Frequency of Communication with Friends and Family: More than three times a week    Frequency of Social Gatherings with Friends and Family: More than three times a week    Attends Religious Services: More than 4 times per year    Active Member of [employer] or Organizations: Yes    Attends Banker Meetings: More than 4 times per year    Marital Status: Divorced  Intimate Partner Violence: Not At Risk (12/25/2023)   Epic    Fear of Current or Ex-Partner: No    Emotionally Abused: No    Physically Abused: No    Sexually Abused: No  Depression (PHQ2-9): Not on file  Alcohol Screen: Not on file  Housing: Low Risk (12/25/2023)   Epic    Unable to Pay for Housing in the Last Year: No    Number of Times Moved in the Last Year: 0    Homeless in the Last Year: No  Utilities: Not At Risk (12/25/2023)   Epic    Threatened with loss of utilities: No  Health Literacy: Not on file (10/24/2023)    Past Medical History, Surgical history, Social history, and Family history were reviewed and updated as appropriate.   Please see review of systems for further details on the patient's review from today.   Objective:   Physical Exam:  There were no vitals taken for this visit.  Physical Exam Constitutional:  General: She is not in acute distress. Musculoskeletal:        General: No deformity.  Neurological:      Mental Status: She is alert and oriented to person, place, and time.     Coordination: Coordination normal.  Psychiatric:        Attention and Perception: Attention and perception normal. She does not perceive auditory or visual hallucinations.        Mood and Affect: Mood normal. Mood is not anxious or depressed. Affect is not labile, blunt, angry or inappropriate.        Speech: Speech normal.        Behavior: Behavior normal.        Thought Content: Thought content normal. Thought content is not paranoid or delusional. Thought content does not include homicidal or suicidal ideation. Thought content does not include homicidal or suicidal plan.        Cognition and Memory: Cognition and memory normal.        Judgment: Judgment normal.     Comments: Insight intact     Lab Review:     Component Value Date/Time   NA 139 12/24/2023 1522   K 4.0 12/24/2023 1522   CL 101 12/24/2023 1522   CO2 28 12/24/2023 1522   GLUCOSE 71 12/24/2023 1522   BUN 14 12/24/2023 1522   CREATININE 0.76 12/24/2023 1522   CALCIUM  9.1 12/24/2023 1522   PROT 5.8 (L) 12/23/2021 0527   ALBUMIN 2.8 (L) 12/23/2021 0527   AST 16 12/23/2021 0527   ALT 15 12/23/2021 0527   ALKPHOS 54 12/23/2021 0527   BILITOT 0.6 12/23/2021 0527   GFRNONAA >60 12/24/2023 1522       Component Value Date/Time   WBC 7.9 12/26/2023 0445   RBC 4.64 12/26/2023 0445   HGB 13.4 12/26/2023 0445   HCT 43.6 12/26/2023 0445   PLT 177 12/26/2023 0445   MCV 94.0 12/26/2023 0445   MCH 28.9 12/26/2023 0445   MCHC 30.7 12/26/2023 0445   RDW 12.9 12/26/2023 0445   LYMPHSABS 1.5 10/20/2023 1257   MONOABS 0.6 10/20/2023 1257   EOSABS 0.2 10/20/2023 1257   BASOSABS 0.0 10/20/2023 1257    No results found for: POCLITH, LITHIUM   No results found for: PHENYTOIN, PHENOBARB, VALPROATE, CBMZ   .res Assessment: Plan:    Plan:  PDMP reviewed  Change: Increase Lamictal  100mg  to 150mg  daily in the morning -  depression  Reduce: Wellbutrin  SR 200mg  BID - will plan to reduce to 150mg  BID - next visit  Continue: Lamictal  200mg  daily at bedtime - depression Buspar  15mg  BID  Prozac  40mg  daily   Trazadone 150mg  at bedtime Ativan  0.5mg  BID daily prn as needed   RTC 6/8 weeks  25 minutes spent dedicated to the care of this patient on the date of this encounter to include pre-visit review of records, ordering of medication, post visit documentation, and face-to-face time with the patient discussing MDD, [name], [name], insomnia and panic attacks. Discussed continuing current medication regimen.  Patient advised to contact office with any questions, adverse effects, or acute worsening in signs and symptoms.   Discussed potential benefits, risk, and side effects of benzodiazepines to include potential risk of tolerance and dependence, as well as possible drowsiness. Advised patient not to drive if experiencing drowsiness and to take lowest possible effective dose to minimize risk of dependence and tolerance.   Discussed potential metabolic side effects associated with atypical antipsychotics, as well as potential risk for movement  side effects. Advised pt to contact office if movement side effects occur.    Please see After Visit Summary for patient specific instructions.   No orders of the defined types were placed in this encounter.     -------------------------------     [1]  Allergies Allergen Reactions   Hydromorphone Shortness Of Breath and Other (See Comments)    Respiratory arrest per patient Coded with fast admin   Penicillins Anaphylaxis   Erythromycin Base Nausea And Vomiting   Liraglutide Other (See Comments)    Unknown    Meperidine  Hcl Other (See Comments)    Unknown    Nsaids Other (See Comments)    GI bleed    Ibuprofen Other (See Comments)    Per pt, it makes her bleed   Meperidine  Hives

## 2024-08-16 ENCOUNTER — Telehealth: Admitting: Adult Health
